# Patient Record
Sex: Male | Born: 1940 | Race: White | Hispanic: No | Marital: Married | Smoking: Never smoker
Health system: Southern US, Community
[De-identification: ages and names within clinical notes are randomized; demographics above are authoritative.]

## PROBLEM LIST (undated history)

## (undated) DIAGNOSIS — N400 Enlarged prostate without lower urinary tract symptoms: Secondary | ICD-10-CM

## (undated) DIAGNOSIS — J309 Allergic rhinitis, unspecified: Secondary | ICD-10-CM

## (undated) DIAGNOSIS — G629 Polyneuropathy, unspecified: Secondary | ICD-10-CM

## (undated) DIAGNOSIS — G4730 Sleep apnea, unspecified: Secondary | ICD-10-CM

## (undated) DIAGNOSIS — F329 Major depressive disorder, single episode, unspecified: Secondary | ICD-10-CM

## (undated) DIAGNOSIS — F419 Anxiety disorder, unspecified: Secondary | ICD-10-CM

## (undated) DIAGNOSIS — M5416 Radiculopathy, lumbar region: Secondary | ICD-10-CM

## (undated) DIAGNOSIS — J189 Pneumonia, unspecified organism: Secondary | ICD-10-CM

## (undated) DIAGNOSIS — N189 Chronic kidney disease, unspecified: Secondary | ICD-10-CM

## (undated) DIAGNOSIS — I639 Cerebral infarction, unspecified: Secondary | ICD-10-CM

## (undated) DIAGNOSIS — G894 Chronic pain syndrome: Secondary | ICD-10-CM

## (undated) DIAGNOSIS — I10 Essential (primary) hypertension: Secondary | ICD-10-CM

## (undated) DIAGNOSIS — E782 Mixed hyperlipidemia: Secondary | ICD-10-CM

## (undated) HISTORY — DX: Sleep apnea, unspecified: G47.30

## (undated) HISTORY — PX: BACK SURGERY: SHX140

## (undated) HISTORY — DX: Cerebral infarction, unspecified: I63.9

## (undated) HISTORY — PX: OTHER SURGICAL HISTORY: SHX169

## (undated) HISTORY — DX: Major depressive disorder, single episode, unspecified: F32.9

## (undated) HISTORY — PX: LUMBAR NERVE STIMLATOR INSERTION: SHX1983

## (undated) HISTORY — DX: Allergic rhinitis, unspecified: J30.9

## (undated) HISTORY — PX: THYROIDECTOMY: SHX17

## (undated) HISTORY — DX: Mixed hyperlipidemia: E78.2

## (undated) HISTORY — DX: Pneumonia, unspecified organism: J18.9

## (undated) HISTORY — PX: CERVICAL FUSION: SHX112

## (undated) HISTORY — DX: Chronic kidney disease, unspecified: N18.9

## (undated) HISTORY — DX: Polyneuropathy, unspecified: G62.9

---

## 2005-08-02 ENCOUNTER — Inpatient Hospital Stay (HOSPITAL_COMMUNITY): Admission: RE | Admit: 2005-08-02 | Discharge: 2005-08-06 | Payer: Self-pay | Admitting: Neurosurgery

## 2006-05-28 ENCOUNTER — Ambulatory Visit (HOSPITAL_COMMUNITY): Admission: RE | Admit: 2006-05-28 | Discharge: 2006-05-28 | Payer: Self-pay | Admitting: Neurosurgery

## 2006-06-19 ENCOUNTER — Inpatient Hospital Stay (HOSPITAL_COMMUNITY): Admission: RE | Admit: 2006-06-19 | Discharge: 2006-06-20 | Payer: Self-pay | Admitting: Neurosurgery

## 2006-07-28 ENCOUNTER — Encounter: Admission: RE | Admit: 2006-07-28 | Discharge: 2006-07-28 | Payer: Self-pay | Admitting: Neurosurgery

## 2006-10-18 ENCOUNTER — Ambulatory Visit (HOSPITAL_COMMUNITY): Admission: RE | Admit: 2006-10-18 | Discharge: 2006-10-18 | Payer: Self-pay | Admitting: Neurosurgery

## 2006-10-24 ENCOUNTER — Inpatient Hospital Stay (HOSPITAL_COMMUNITY): Admission: RE | Admit: 2006-10-24 | Discharge: 2006-10-26 | Payer: Self-pay | Admitting: Neurosurgery

## 2007-07-29 ENCOUNTER — Ambulatory Visit (HOSPITAL_COMMUNITY): Admission: RE | Admit: 2007-07-29 | Discharge: 2007-07-29 | Payer: Self-pay | Admitting: Neurosurgery

## 2007-12-10 ENCOUNTER — Ambulatory Visit (HOSPITAL_COMMUNITY): Admission: RE | Admit: 2007-12-10 | Discharge: 2007-12-10 | Payer: Self-pay | Admitting: Anesthesiology

## 2008-02-13 ENCOUNTER — Ambulatory Visit (HOSPITAL_COMMUNITY): Admission: RE | Admit: 2008-02-13 | Discharge: 2008-02-14 | Payer: Self-pay | Admitting: Neurological Surgery

## 2009-10-19 ENCOUNTER — Ambulatory Visit (HOSPITAL_COMMUNITY): Admission: RE | Admit: 2009-10-19 | Discharge: 2009-10-19 | Payer: Self-pay | Admitting: Anesthesiology

## 2010-02-02 ENCOUNTER — Encounter: Admission: RE | Admit: 2010-02-02 | Discharge: 2010-02-02 | Payer: Self-pay | Admitting: Anesthesiology

## 2010-09-27 NOTE — Op Note (Signed)
NAMELEVAUGHN, Ronald [name]                ACCOUNT NO.:  [id number]   MEDICAL RECORD NO.:  [id number]          PATIENT TYPE:  OIB   LOCATION:  3011                         FACILITY:  [facility]   PHYSICIAN:  Hilda Lias, M.D.   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   POSTOPERATIVE DIAGNOSIS:  Chronic L5-S1 radiculopathy status post L5-S1  fusion.   PROCEDURE:  Removal of right L5-S1 pedicle screws, removal of the left  L5 pedicle screws bilaterally, L5-S1 foraminotomy with lysis of  adhesions to decompress the L5-S1 nerve root.  Microscope.   SURGEON:  Hilda Lias, M.D.   ASSISTANT:  Danae Orleans. Venetia Maxon, M.D.   CLINICAL HISTORY:  Mr. Antillon is a gentleman who in the past underwent  fusion of L5-S1.  The patient later on developed pain.  We did a  foraminotomy but he is not any better.  We repeated the myelogram and  there is a possibility that the pedicle screws on the right side at S1  and on the left at L5 might be in close proximity to the nerve.  The  patient is no better.  He had been taking quite a bit of pain medication  and Neurontin without relief of the pain. Because of the findings,  surgery was advised.  We decided not to remove the left S1 nerve root  because not compromising the nerve and second because it is close to the  iliac vein.  We want to avoid any bleeding. Beside, clinically it is not  compromised as well by x-ray.  The risks were explained to him in the  history and physical.   DESCRIPTION OF PROCEDURE:  Mr. Nauert was taken to the OR and he was  positioned in a prone manner.  The skin was cleaned with DuraPrep.  A  midline incision was made all the way until we found the upper part of  the S1.  We went laterally and we identified easily the L5-S1 pedicle  screws.  Immediately we removed the caps  of the right side, we removed  the rod, and we removed both the right L5 and S1 pedicle screws.  On the  left side, we removed the caps and the rod.  We removed only the screw  of the L5 leaving the S1 in place.  Then, we started doing lysis of  adhesion.  The patient had quite a bit of adhesion and lysis was  accomplished.  We finally were able to get into the foramen and we did a  foraminotomy to decompress the L5-S1 nerve root.  This procedure was  done bilaterally.  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  We used a Tisseel to prevent any CSF leak.  Valsalva maneuver  to 40 was negative.  From then on, the area was irrigated.  The wound  was closed with Vicryl  and nylon.           ______________________________  Hilda Lias, M.D.     EB/MEDQ  D:  10/24/2006  T:  10/24/2006  Job:  [id number]

## 2010-09-27 NOTE — H&P (Signed)
NAMESIMCHA, [name]                ACCOUNT NO.:  [id number]   MEDICAL RECORD NO.:  [id number]          PATIENT TYPE:  OIB   LOCATION:  3011                         FACILITY:  [facility]   PHYSICIAN:  Hilda Lias, M.D.   DATE OF BIRTH:  02-19-1941   DATE OF ADMISSION:  10/24/2006  DATE OF DISCHARGE:                              HISTORY & PHYSICAL   Ronald [name] is a gentleman who in the past underwent fusion at the level  of L5-S1 because of chronic back pain with radiation to both legs.  Later on he developed more pain and in February we did a bilateral  foraminotomy at the level of L5-S1.  Nevertheless, he continued to have  pain with the pain going to both lower extremities.  There is no  weakness.  The patient has been taking oxycodone and Neurontin without  relief of the pain.  We repeated the MRI and there is a possibility that  the pedicle screws at the level of right S1 and left L5 may be close to  the nerve.  Nevertheless, since he had failed with every single  conservative treatment, at the end we agreed to proceed with removal of  the screws, leaving the left S1 nerve root  in place because of close  proximity to the vein.  Nevertheless, there is no compromise of the  medial wall in that one.   PAST MEDICAL HISTORY:  Lumbar fusion, L5-S1, with bilateral foraminotomy  at L5-S1 in February.  He has a thyroidectomy, a cervical fusion.   He is allergic to PHENOBARBITAL and MILK PRODUCTS.   SOCIAL HISTORY:  Negative.   FAMILY HISTORY:  Mother has COPD.   REVIEW OF SYSTEMS:  Positive for back and leg pain.   PHYSICAL EXAMINATION:  HEENT:  Head, nose and throat normal.  NECK:  There is a scar anteriorly.  LUNGS:  Clear.  CARDIAC:  Heart sounds normal.  ABDOMEN:  Normal.  EXTREMITIES:  Normal pulses.  NEUROLOGIC:  Mental status normal.  Cranial nerves normal.  Strength  normal.  Sensation normal.  Coordination normal.  Straight leg raising,  SLR is positive at 60 degrees  bilaterally.   CLINICAL IMPRESSION:  Chronic radiculopathy, status post fusion at L5-  S1.   RECOMMENDATIONS:  The patient going to have removal of the screws,  leaving the one at the level of S1 in place because of proximity to the  vein.  Nevertheless, there is no need to remove that since there is no  compromise  of the medial wall of the pedicle.  The patient knows about the risks  such as no improvement whatsoever, CSF leak, need for further surgery,  infection.  The patient declined more opinion.  He did not want any pain  clinic for pain control but to proceed with surgery to see if that will  help him.           ______________________________  Hilda Lias, M.D.     EB/MEDQ  D:  10/24/2006  T:  10/25/2006  Job:  [id number]

## 2010-09-27 NOTE — Op Note (Signed)
NAMEJIMY, GATES                ACCOUNT NO.:  [id number]   MEDICAL RECORD NO.:  [id number]          PATIENT TYPE:  OIB   LOCATION:  3526                         FACILITY:  [facility]   PHYSICIAN:  Stefani Dama, M.D.  DATE OF BIRTH:  11-20-1940   DATE OF PROCEDURE:  02/13/2008  DATE OF DISCHARGE:                               OPERATIVE REPORT   PREOPERATIVE DIAGNOSIS:  Intractable back pain status post lumbar  decompression and fusion.   POSTOPERATIVE DIAGNOSIS:  Intractable back pain status post lumbar  decompression and fusion.   PROCEDURE:  Placement of spinal cord stimulator.   SURGEON:  Stefani Dama, MD   ANESTHESIA:  General endotracheal.   INDICATIONS:  Mr. Peng Thorstenson is a 70 year old individual who has been  having substantial problem with back pain, hip pain, and pain in both  buttocks and legs.  He had a spinal cord stimulator trial done as an  outpatient with lead at T9 and T10 and this gave him good relief.  He is  now being taken to the operating room to undergo placement of permanent  spinal cord stimulator with a paddle lead.   PROCEDURE:  The patient was brought to the operating room supine on the  stretcher.  After smooth induction of general endotracheal anesthesia,  he was turned prone.  The back was prepped with alcohol and DuraPrep and  draped in sterile fashion.  A midline incision was made at the  thoracolumbar junction overlying T11 and T-12.  Subperiosteal dissection  performed on the left side, and a laminotomy was performed at the T11-12  level.  Once this was adequately sized, a paddle lead was then placed  and threaded up to level of T9 and T10.  Good coverage of the T9 and T10  area was observed with the paddle lead being placed midway on the disk  space of T9-10.  Then, a subcutaneous tunnel was made through a separate  incision over the left posterior-superior iliac crest.  A pocket had  been fashioned in this site for the generator.  When  the lead was passed  through this area, the leads were connected to the generator after being  carefully inspected and dried and an impedance checked was performed  with the generator implanted in the pocket.  The impedance test was good  on all leads and then the leads were tightened into the final resting  position and the generator was placed into the subcutaneous pocket with  the extra amount of the lead curled underneath the generator.  A final  check was made of placement of the electrodes.  They needed to be  adjusted as the electrode had slipped down somewhat.  When the  electrodes were centered over the T9-10 level, a keeper was fastened to  one of the electrode leads and this was placed in the paraspinous  musculature to maintain it in position.  Once this was secured, the  retractors were removed.  The lumbodorsal fascia was closed in the  thoracic spine with 0 Vicryl, 2-0 Vicryl was used in the subcutaneous  tissues,  and 3-0 Vicryl used to close the subcuticular  skin.  The pouch for the spinal cord stimulator generator was then  closed with 2-0 Vicryl interrupted fashion in the subcutaneous tissues  and 3-0 Vicryl subcuticularly.  Dry sterile dressings were placed on  each of the wound.  The patient tolerated the procedure well and was  returned to recovery room in stable condition.  Blood loss was minimal.      Stefani Dama, M.D.  Electronically Signed     HJE/MEDQ  D:  02/13/2008  T:  02/14/2008  Job:  [id number]

## 2010-09-30 NOTE — Discharge Summary (Signed)
[name], Ronald [name]                ACCOUNT NO.:  [id number]   MEDICAL RECORD NO.:  [id number]          PATIENT TYPE:  INP   LOCATION:  3014                         FACILITY:  [facility]   PHYSICIAN:  Hilda Lias, M.D.   DATE OF BIRTH:  11-18-1940   DATE OF ADMISSION:  08/02/2005  DATE OF DISCHARGE:  08/06/2005                                 DISCHARGE SUMMARY   ADMISSION DIAGNOSIS:  L5-S1 spondylolisthesis, grade 1 with chronic  radiculopathy   FINAL DIAGNOSIS(ES):  L5-S1 spondylolisthesis, grade 1 with chronic  radiculopathy .   CLINICAL HISTORY:  The patient was admitted because of back pain radiating  to both legs.  X-rays showed spondylolisthesis, grade 1, between L5-S1.  Surgery was advised.  Marland Kitchen   HOSPITAL COURSE:  The patient was taken to surgery and L5-S1 diskectomy,  interbody fusion, posterolateral arthrodesis was done.  The patient at the  beginning had quite a bit of pain but later on started ambulating and he was  discharged on March 25 by Dr. Lovell Sheehan on pain medication and muscle  relaxants.   CONDITION ON DISCHARGE:  Improved.   DIET:  Regular.   ACTIVITY:  Do not drive for at least two weeks.   FOLLOW UP:  In four weeks in my office.           ______________________________  Hilda Lias, M.D.     EB/MEDQ  D:  09/05/2005  T:  09/06/2005  Job:  [id number]

## 2010-09-30 NOTE — H&P (Signed)
Ronald [name], Ronald [name]                ACCOUNT NO.:  [id number]   MEDICAL RECORD NO.:  [id number]          PATIENT TYPE:  INP   LOCATION:  3032                         FACILITY:  [facility]   PHYSICIAN:  Hilda Lias, M.D.   DATE OF BIRTH:  Dec 25, 1940   DATE OF ADMISSION:  06/19/2006  DATE OF DISCHARGE:                              HISTORY & PHYSICAL   HISTORY OF PRESENT ILLNESS:  Ronald [name] is a gentleman, who on August 02, 2005, underwent L5-S1 fusion secondary to a spondylolisthesis with a  chronic radiculopathy.  The patient did well.  He was seen in my office  on May 07, 2006, and prior to that on June 2007.  He tell me that  the pain is not any better.  That he is having pain going down to both  lower extremity.  It gets worse with walking.  His epidural injection  did not help him.  Because of that we decided to bring him to the  hospital to take a look at the L5-S1 area.  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   PAST MEDICAL HISTORY:  1. Lumbar fusion L5-S1 a year ago.  2. He has had a thyroidectomy.  3. Cervical fusion.   ALLERGIES:  He is allergic to PHENOBARBITAL.   SOCIAL HISTORY:  Negative.   FAMILY HISTORY:  Negative.   REVIEW OF SYSTEMS:  Is positive for back pain, leg pain and signs of  depression.   PHYSICAL EXAMINATION:  HEAD, EARS, EYES, THROAT:  Normal.  NECK:  There is a scar from previous surgery.  CARDIOVASCULAR:  Unremarkable.  LUNGS:  Unremarkable.  ABDOMEN:  Unremarkable.  EXTREMITY:  Unremarkable.  NEURO:  Unremarkable.  His strength is normal.  He has had some mild  weakness in dorsiflexion of both legs.  Straight leg raising is positive  on 60 degrees.  Reflexes are symmetrical.   Lumbar spinal x-ray showed some mild spondylosis at the L4-5 and L5-S1.  The area with flexion and extension showed this diffuse is completely  solid.   CLINICAL IMPRESSION:  Failure back syndrome.  Continuation of the L5-S1  radiculopathy.   RECOMMENDATIONS:  We are going to admit Ronald [name] to surgery.  The  procedure will be a foraminotomy to compressed L5-S1 root.  He knows all  the risks such as no improvement whatsoever, need of further surgery,  infection, cerebrospinal fluid leak, damage to the vessel of the  abdomen.           ______________________________  Hilda Lias, M.D.     EB/MEDQ  D:  06/19/2006  T:  06/20/2006  Job:  [id number]

## 2010-09-30 NOTE — Op Note (Signed)
[name], Ronald [name]                ACCOUNT NO.:  [id number]   MEDICAL RECORD NO.:  [id number]          PATIENT TYPE:  INP   LOCATION:  3032                         FACILITY:  [facility]   PHYSICIAN:  Hilda Lias, M.D.   DATE OF BIRTH:  [birth year]   DATE OF PROCEDURE:  06/19/2006  DATE OF DISCHARGE:                               OPERATIVE REPORT   PREOPERATIVE DIAGNOSES:  Bilateral L5-S1 radiculopathy, status post L5-  S1 fusion in March 2007.   POSTOPERATIVE DIAGNOSES:  Bilateral L5-S1 radiculopathy, status post L5-  S1 fusion in March 2007.   PROCEDURES:  Bilateral L4, L5 and S1 nerve root decompression.   SURGEON:  Hilda Lias, M.D.   ASSISTANT:  Clydene Fake, M.D.   CLINICAL HISTORY:  Ronald [name] is a gentleman, who underwent L5-S1  diskectomy with fusion back in March 2007.  He did well, but lately had  been complaining of burning pain going to both lower extremities.  Myelogram followed a CT scan showed a good fusion, and there was a  narrowing of the foramen at the level compromising mostly L5 and S1.  Surgery was advised and the risks were explained at the History and  Physical.   DESCRIPTION OF PROCEDURES:  The patient was taken to the OR, and after  intubation, the back was prepped with DuraPrep.  A midline incision  following the previous one was made, and we retracted the muscle all the  way laterally.  We found the pedicle screws bilaterally.  Then, we found  quite a bit of adhesions.  Lysis was accomplished, and we were able to  get into the dural sac.  At the foramina for the L5-S1 level, and using  the drill, as well, the 1, 2 and 3-mm Kerrison punches, we did have good  decompression.  At L4 we did a foraminotomy; that was also completely  clean.  Nevertheless, the S1 also was a bit narrow, and decompression  was achieved.  Out of the 3 nerve roots, the L5 was the worst one.  At  the end, we had a good decompression.  Valsalva maneuver was negative.  Fat was  left on top of the dura mater to prevent adhesions.  Valsalva  maneuver was negative.  From then on, the wound was closed with Vicryl  and Steri-Strips.           ______________________________  Hilda Lias, M.D.     EB/MEDQ  D:  06/19/2006  T:  06/19/2006  Job:  [id number]

## 2010-09-30 NOTE — Op Note (Signed)
NAMELAIKEN, NOHR                ACCOUNT NO.:  [id number]   MEDICAL RECORD NO.:  [id number]          PATIENT TYPE:  INP   LOCATION:  2899                         FACILITY:  [facility]   PHYSICIAN:  Hilda Lias, M.D.   DATE OF BIRTH:  07/28/1940   DATE OF PROCEDURE:  DATE OF DISCHARGE:                                 OPERATIVE REPORT   Audio too short to transcribe (less than 5 seconds)           ______________________________  Hilda Lias, M.D.     EB/MEDQ  D:  08/02/2005  T:  08/02/2005  Job:  [id number]

## 2010-09-30 NOTE — Op Note (Signed)
NAMEREYNOLD, MANTELL                ACCOUNT NO.:  [id number]   MEDICAL RECORD NO.:  [id number]          PATIENT TYPE:  INP   LOCATION:  2899                         FACILITY:  [facility]   PHYSICIAN:  Hilda Lias, M.D.   DATE OF BIRTH:  1940-08-14   DATE OF PROCEDURE:  08/02/2005  DATE OF DISCHARGE:                                 OPERATIVE REPORT   PREOPERATIVE DIAGNOSIS:  L5-S1 spondylolisthesis with chronic radiculopathy,  degenerative disk disease.   POSTOPERATIVE DIAGNOSIS:  L5-S1 spondylolisthesis with chronic  radiculopathy, degenerative disk disease.   OPERATION PERFORMED:  L5-S1 diskectomy, laminectomy and facetectomy,  interbody fusion with cage with autograft and bone morphogenic protein,  posterolateral arthrodesis with autograft and bone morphogenic protein,  pedicle screws, L5 to S1.  C-arm.  Cell Saver.   SURGEON:  Hilda Lias, M.D.   ASSISTANT:  Danae Orleans. Venetia Maxon, M.D.   ANESTHESIA:  General.   INDICATIONS FOR PROCEDURE:  The patient was admitted because of back pain  with radiation to both legs.  X-ray showed spondylolisthesis with  degenerative disk disease and ruptured disk.  Surgery was advised and the  risks were explained in the history and physical.   DESCRIPTION OF PROCEDURE:  The patient was taken to the operating room and  he was positioned in prone manner.  The back was prepped.  Midline incision  was made from L4 down to L5-S1.  Muscle was retracted all the way laterally  until we found the transverse process of L5.  From then on, with the  Leksell, we removed the spinous process and the lamina as well as the facet  of L5.  The patient had quite a bit of adhesion and lysis was accomplished.  Then we retracted the thecal sac.  We entered the disk space first on the  left side and then later on the right side.  A total diskectomy was  achieved.  The end plate were also removed.  From then on, two cages with  autograft and BMP were inserted.  They were  like 12 x 22.  From then on, we  went laterally and we removed the periosteum of the ala of the sacrum and  the transverse processes.  Posterolateral arthrodesis with BMP and autograft  was done.  Then with the help of C-arm first in the AP view and later on in  a lateral view, we introduced four pedicle screws.  At the level of L5 they  were like 5.5 x 45 and the lower part was 5.5 x 50.  AP and lateral showed  good position of the graft.  From then on the area was irrigated.  Valsalva  maneuver was negative. Fentanyl was left in the dural space and the wound  was closed with Vicryl and Steri-Strips.           ______________________________  Hilda Lias, M.D.     EB/MEDQ  D:  08/02/2005  T:  08/03/2005  Job:  [id number]

## 2011-02-13 LAB — CBC
HCT: 34.6 — ABNORMAL LOW
Platelets: 211
RBC: 3.74 — ABNORMAL LOW
RDW: 13.9

## 2011-02-13 LAB — BASIC METABOLIC PANEL
BUN: 13
CO2: 29
Calcium: 9.4
GFR calc non Af Amer: >60

## 2011-03-02 LAB — BASIC METABOLIC PANEL
BUN: 16
CO2: 24
Chloride: 101
GFR calc non Af Amer: >60
Potassium: 3.7
Sodium: 135

## 2011-03-02 LAB — CBC
HCT: 38.1 — ABNORMAL LOW
Hemoglobin: 12.9 — ABNORMAL LOW
MCHC: 34
RBC: 4.22
WBC: 5.2

## 2011-09-29 ENCOUNTER — Other Ambulatory Visit: Payer: Self-pay | Admitting: Physician Assistant

## 2011-09-29 DIAGNOSIS — M5410 Radiculopathy, site unspecified: Secondary | ICD-10-CM

## 2011-10-02 ENCOUNTER — Ambulatory Visit
Admission: RE | Admit: 2011-10-02 | Discharge: 2011-10-02 | Disposition: A | Discharge status: Home / Self Care | Payer: Medicare Other | Source: Ambulatory Visit | Attending: Physician Assistant | Admitting: Physician Assistant

## 2011-10-02 VITALS — BP 172/81 | HR 69 | Ht 66.0 in | Wt 180.0 lb

## 2011-10-02 DIAGNOSIS — M5410 Radiculopathy, site unspecified: Secondary | ICD-10-CM

## 2011-10-02 MED ORDER — IOHEXOL 180 MG/ML  SOLN
20.0000 mL | Freq: Once | INTRAMUSCULAR | Status: AC | PRN
  Administered 2011-10-02: 20 mL via INTRATHECAL

## 2011-10-02 MED ORDER — DIAZEPAM 5 MG PO TABS
5.0000 mg | ORAL_TABLET | Freq: Once | ORAL | Status: AC
  Administered 2011-10-02: 5 mg via ORAL

## 2011-10-02 NOTE — Discharge Instructions (Signed)
Myelogram Discharge Instructions  1. Go home and rest quietly for the next 24 hours.  It is important to lie flat for the next 24 hours.  Get up only to go to the restroom.  You may lie in the bed or on a couch on your back, your stomach, your left side or your right side.  You may have one pillow under your head.  You may have pillows between your knees while you are on your side or under your knees while you are on your back.  2. DO NOT drive today.  Recline the seat as far back as it will go, while still wearing your seat belt, on the way home.  3. You may get up to go to the bathroom as needed.  You may sit up for 10 minutes to eat.  You may resume your normal diet and medications unless otherwise indicated.  Drink lots of extra fluids today and tomorrow.  4. The incidence of headache, nausea, or vomiting is about 5% (one in 20 patients).  If you develop a headache, lie flat and drink plenty of fluids until the headache goes away.  Caffeinated beverages may be helpful.  If you develop severe nausea and vomiting or a headache that does not go away with flat bed rest, call [phone].  5. You may resume normal activities after your 24 hours of bed rest is over; however, do not exert yourself strongly or do any heavy lifting tomorrow. If when you get up you have a headache when standing, go back to bed and force fluids for another 24 hours.  6. Call your physician for a follow-up appointment.  The results of your myelogram will be sent directly to your physician by the following day.  7. If you have any questions or if complications develop after you arrive home, please call [phone].  Discharge instructions have been explained to the patient.  The patient, or the person responsible for the patient, fully understands these instructions.       May resume cymbalta on Oct 03, 2011,after 1:00 pm.

## 2012-11-05 ENCOUNTER — Emergency Department (HOSPITAL_COMMUNITY): Payer: Medicare Other

## 2012-11-05 ENCOUNTER — Observation Stay (HOSPITAL_COMMUNITY)
Admission: EM | Admit: 2012-11-05 | Discharge: 2012-11-07 | Disposition: A | Discharge status: Home / Self Care | Payer: Medicare Other | Attending: Family Medicine | Admitting: Family Medicine

## 2012-11-05 ENCOUNTER — Encounter (HOSPITAL_COMMUNITY): Payer: Self-pay | Admitting: *Deleted

## 2012-11-05 DIAGNOSIS — N400 Enlarged prostate without lower urinary tract symptoms: Secondary | ICD-10-CM | POA: Insufficient documentation

## 2012-11-05 DIAGNOSIS — G894 Chronic pain syndrome: Secondary | ICD-10-CM | POA: Insufficient documentation

## 2012-11-05 DIAGNOSIS — R079 Chest pain, unspecified: Principal | ICD-10-CM | POA: Insufficient documentation

## 2012-11-05 DIAGNOSIS — E785 Hyperlipidemia, unspecified: Secondary | ICD-10-CM

## 2012-11-05 DIAGNOSIS — IMO0002 Reserved for concepts with insufficient information to code with codable children: Secondary | ICD-10-CM | POA: Insufficient documentation

## 2012-11-05 DIAGNOSIS — I10 Essential (primary) hypertension: Secondary | ICD-10-CM | POA: Insufficient documentation

## 2012-11-05 DIAGNOSIS — R61 Generalized hyperhidrosis: Secondary | ICD-10-CM | POA: Insufficient documentation

## 2012-11-05 DIAGNOSIS — R0602 Shortness of breath: Secondary | ICD-10-CM | POA: Insufficient documentation

## 2012-11-05 DIAGNOSIS — F411 Generalized anxiety disorder: Secondary | ICD-10-CM | POA: Insufficient documentation

## 2012-11-05 HISTORY — DX: Essential (primary) hypertension: I10

## 2012-11-05 HISTORY — DX: Chronic pain syndrome: G89.4

## 2012-11-05 HISTORY — DX: Radiculopathy, lumbar region: M54.16

## 2012-11-05 HISTORY — DX: Anxiety disorder, unspecified: F41.9

## 2012-11-05 HISTORY — DX: Benign prostatic hyperplasia without lower urinary tract symptoms: N40.0

## 2012-11-05 LAB — BASIC METABOLIC PANEL
BUN: 22 mg/dL (ref 6–23)
Calcium: 9.6 mg/dL (ref 8.4–10.5)
GFR calc non Af Amer: 61 mL/min — ABNORMAL LOW (ref >90)
Glucose, Bld: 98 mg/dL (ref 70–99)
Potassium: 4.5 mEq/L (ref 3.5–5.1)

## 2012-11-05 LAB — CBC
HCT: 37.3 % — ABNORMAL LOW (ref 39.0–52.0)
Hemoglobin: 13 g/dL (ref 13.0–17.0)
MCH: 32 pg (ref 26.0–34.0)
MCHC: 34.9 g/dL (ref 30.0–36.0)

## 2012-11-05 LAB — POCT I-STAT TROPONIN I: Troponin i, poc: 0 ng/mL (ref 0.00–0.08)

## 2012-11-05 LAB — TROPONIN I: Troponin I: <0.3 ng/mL (ref <0.30)

## 2012-11-05 MED ORDER — TAMSULOSIN HCL 0.4 MG PO CAPS
0.4000 mg | ORAL_CAPSULE | Freq: Every day | ORAL | Status: DC
Start: 2012-11-06 — End: 2012-11-07
  Administered 2012-11-06 – 2012-11-07 (×2): 0.4 mg via ORAL
  Filled 2012-11-05 (×2): qty 1

## 2012-11-05 MED ORDER — SODIUM CHLORIDE 0.9 % IJ SOLN: 3.0000 mL | INTRAMUSCULAR | Status: DC | PRN

## 2012-11-05 MED ORDER — SODIUM CHLORIDE 0.9 % IJ SOLN
3.0000 mL | Freq: Two times a day (BID) | INTRAMUSCULAR | Status: DC
  Administered 2012-11-05 – 2012-11-07 (×4): 3 mL via INTRAVENOUS

## 2012-11-05 MED ORDER — DULOXETINE HCL 60 MG PO CPEP
60.0000 mg | ORAL_CAPSULE | Freq: Every day | ORAL | Status: DC
  Administered 2012-11-06 – 2012-11-07 (×2): 60 mg via ORAL
  Filled 2012-11-05 (×2): qty 1

## 2012-11-05 MED ORDER — OXYCODONE HCL 5 MG PO TABS
15.0000 mg | ORAL_TABLET | ORAL | Status: DC | PRN
  Administered 2012-11-05 – 2012-11-07 (×10): 15 mg via ORAL
  Filled 2012-11-05: qty 2
  Filled 2012-11-05 (×9): qty 3
  Filled 2012-11-05: qty 1

## 2012-11-05 MED ORDER — SODIUM CHLORIDE 0.9 % IJ SOLN: 3.0000 mL | Freq: Two times a day (BID) | INTRAMUSCULAR | Status: DC

## 2012-11-05 MED ORDER — ASPIRIN EC 81 MG PO TBEC
81.0000 mg | DELAYED_RELEASE_TABLET | Freq: Every day | ORAL | Status: DC
  Administered 2012-11-06 – 2012-11-07 (×2): 81 mg via ORAL
  Filled 2012-11-05 (×2): qty 1

## 2012-11-05 MED ORDER — HEPARIN SODIUM (PORCINE) 5000 UNIT/ML IJ SOLN
5000.0000 [IU] | Freq: Three times a day (TID) | INTRAMUSCULAR | Status: DC
  Administered 2012-11-05 – 2012-11-07 (×4): 5000 [IU] via SUBCUTANEOUS
  Filled 2012-11-05 (×8): qty 1

## 2012-11-05 MED ORDER — SODIUM CHLORIDE 0.9 % IV SOLN: 250.0000 mL | INTRAVENOUS | Status: DC | PRN

## 2012-11-05 MED ORDER — AMLODIPINE BESYLATE 5 MG PO TABS
5.0000 mg | ORAL_TABLET | Freq: Every day | ORAL | Status: DC
  Administered 2012-11-06: 5 mg via ORAL
  Filled 2012-11-05: qty 1

## 2012-11-05 MED ORDER — ONDANSETRON HCL 4 MG/2ML IJ SOLN: 4.0000 mg | Freq: Three times a day (TID) | INTRAMUSCULAR | Status: AC | PRN

## 2012-11-05 MED ORDER — DIAZEPAM 5 MG PO TABS
2.5000 mg | ORAL_TABLET | Freq: Every evening | ORAL | Status: DC | PRN
Start: 2012-11-05 — End: 2012-11-07
  Administered 2012-11-05 – 2012-11-06 (×2): 2.5 mg via ORAL
  Filled 2012-11-05 (×2): qty 1

## 2012-11-05 NOTE — ED Provider Notes (Signed)
Medical screening examination/treatment/procedure(s) were conducted as a shared visit with non-physician practitioner(s) and myself.  I personally evaluated the patient during the encounter  1 year of intermittent R jaw and chest pain, severe today and moved to L jaw. EKG unchanged. Troponin negative.  No recent stress test. Patient prefers inpatient evaluation.  Glynn Octave, MD 11/05/12 2134

## 2012-11-05 NOTE — ED Provider Notes (Signed)
History    CSN: [id number] Arrival date & time 11/05/12  1246  First MD Initiated Contact with Patient 11/05/12 1352     Chief Complaint  Patient presents with  . Chest Pain   (Consider location/radiation/quality/duration/timing/severity/associated sxs/prior Treatment) HPI Comments: Patient with history of negative stress test -- presents with complaint of chest and right-sided neck pain that began acutely at 11 AM and resolved approximately 45 minutes later after taking #3 324 mg aspirin and 2 of his stepsons nitroglycerin. Patient has had similar symptoms once every one to 2 weeks over the past year but never this severe. Pain returned briefly this afternoon the patient came to the emergency department for evaluation. Pain is now resolved. It was not associated with fever, nausea, vomiting, lightheadedness, shortness of breath, palpitations, diaphoresis. Patient has not told his primary care physician about the symptoms. No lower extremity swelling. Patient has a has history of lower back problems for which he has been prescribed oxycodone. Family states that patient has been under more stress recently. Onset of symptoms acute. Course is resolved. Nothing makes symptoms worse.  The history is provided by the patient.   Past Medical History  Diagnosis Date  . Hypertension    Past Surgical History  Procedure Laterality Date  . Back surgery     History reviewed. No pertinent family history. History  Substance Use Topics  . Smoking status: Never Smoker   . Smokeless tobacco: Not on file  . Alcohol Use: No    Review of Systems  Constitutional: Negative for fever and diaphoresis.  HENT: Positive for neck pain.   Eyes: Negative for redness.  Respiratory: Negative for cough and shortness of breath.   Cardiovascular: Positive for chest pain. Negative for palpitations and leg swelling.  Gastrointestinal: Negative for nausea, vomiting and abdominal pain.  Genitourinary: Negative for  dysuria.  Musculoskeletal: Negative for back pain.  Skin: Negative for rash.  Neurological: Negative for syncope and light-headedness.    Allergies  Phenobarbital  Home Medications   Current Outpatient Rx  Name  Route  Sig  Dispense  Refill  . amLODipine (NORVASC) 5 MG tablet   Oral   Take 5 mg by mouth daily.         . celecoxib (CELEBREX) 200 MG capsule   Oral   Take 200 mg by mouth daily.         . diazepam (VALIUM) 5 MG tablet   Oral   Take 2.5 mg by mouth at bedtime as needed for anxiety.         . DULoxetine (CYMBALTA) 60 MG capsule   Oral   Take 60 mg by mouth daily.         Marland Kitchen oxyCODONE (ROXICODONE) 15 MG immediate release tablet   Oral   Take 15 mg by mouth every 4 (four) hours as needed for pain. Up to 7 tablets in a day.         . tamsulosin (FLOMAX) 0.4 MG CAPS   Oral   Take 0.4 mg by mouth daily.          BP 159/101  Pulse 83  Temp(Src) 98.1 F (36.7 C) (Oral)  Resp 18  SpO2 99% Physical Exam  Nursing note and vitals reviewed. Constitutional: He appears well-developed and well-nourished.  HENT:  Head: Normocephalic and atraumatic.  Mouth/Throat: Mucous membranes are normal. Mucous membranes are not dry.  Eyes: Conjunctivae are normal.  Neck: Trachea normal and normal range of motion. Neck supple. Normal  carotid pulses and no JVD present. No muscular tenderness present. Carotid bruit is not present. No tracheal deviation present.  Cardiovascular: Normal rate, regular rhythm, S1 normal, S2 normal, normal heart sounds and intact distal pulses.  Exam reveals no distant heart sounds and no decreased pulses.   No murmur heard. 2+ radial pulses, equal.  Pulmonary/Chest: Effort normal and breath sounds normal. No respiratory distress. He has no wheezes. He exhibits no tenderness.  Abdominal: Soft. Normal aorta and bowel sounds are normal. There is no tenderness. There is no rebound and no guarding.  No pulsatile abdominal masses palpated   Musculoskeletal: He exhibits no edema.  Neurological: He is alert.  Skin: Skin is warm and dry. He is not diaphoretic. No cyanosis. No pallor.  Psychiatric: He has a normal mood and affect.    ED Course  Procedures (including critical care time) Labs Reviewed  CBC - Abnormal; Notable for the following:    RBC 4.06 (*)    HCT 37.3 (*)    All other components within normal limits  BASIC METABOLIC PANEL - Abnormal; Notable for the following:    GFR calc non Af Amer 61 (*)    GFR calc Af Amer 71 (*)    All other components within normal limits  POCT I-STAT TROPONIN I   No results found. 1. Chest pain     1:55 PM Patient seen and examined. Work-up initiated. Medications ordered.   Vital signs reviewed and are as follows: Filed Vitals:   11/05/12 1251  BP: 159/101  Pulse: 83  Temp: 98.1 F (36.7 C)  Resp: 18    Date: 11/05/2012  Rate: 83  Rhythm: normal sinus rhythm  QRS Axis: normal  Intervals: normal  ST/T Wave abnormalities: normal  Conduction Disutrbances:none  Narrative Interpretation:   Old EKG Reviewed: changes noted from 10/24/2006, rate faster, otherwise unchanged  3:51 PM Dr. Manus Gunning has seen. Will admit. I have spoken with [facility] who will see.    MDM  CP, admit. Troponin, EKG neg at this point.     Renne Crigler, PA-C 11/05/12 1552

## 2012-11-05 NOTE — H&P (Signed)
Family Medicine Teaching [facility] Admission History and Physical Service Pager: [phone]  Patient name: Ronald [name] Medical record number: [id number] Date of birth: 1941/01/01 Age: 72 y.o. Gender: male  Primary Care Provider: No primary provider on file.  Chief Complaint: Chest pain  Assessment and Plan: Ronald [name] is a 72 y.o. year old male here with chest pain, radiating to his jaw. His PMH is significant for HTN.  # Chest pain: Patient reports two episodes of chest pain with a pressure-like quality and radiation to his jaw bilaterally. He endorses some mild diaphoresis, but denies N/V and SOB. This discomfort was unrelieved with 325mg  ASA x3, but faded after half an hour. Upon experiencing a second episode, patient decided to come to the ED. Description of pain with negative POC troponin and normal EKG point more towards an MSK cause than cardiac. Patient has no pain upon interview. - Continue to monitor chest pain  - Admit to telemetry - CBC, BMET, TSH, Hgb A1c, Lipid panel - Start 81mg  ASA daily [ ]  F/u with Cardiology as an outpatient, likely needs stress test [ ]  Cycle cardiac enzymes [ ]  Repeat EKG tomorrow morning  # History of HTN: Patient has elevated pressures in the ED of 159/101. Will restart home medications (amlodipine). - Continue to monitor  # History of Chronic pain: Patient has chronic back pain 2/2 spinal fusion surgery. This also causes chronic pain in his legs. He is s/p nerve stimulator implantation. - Continue home pain meds (cymbalta, oxycodone, celebrex)  # History of BPH: Patient denies any change from urinary baseline - Continue home Flomax  # History of anxiety - Continue home valium  FEN/GI: Heart healthy, as tolerated Prophylaxis: SQ Heparin Disposition: Pending negative cardiac workup Code Status: FULL, as discussed upon admission  History of Present Illness: Ronald [name] is a 72 y.o. year old male presenting with chest pain. He  has a history of intermittent episodes of R jaw pain that spreads to his R chest wall, usually relieved by 325mg  ASA. These episodes occur once every 1-2 weeks. Today, the pain was "different," affecting his jaw bilaterally and with more of a pressure-like sensation in his chest. He endorsed mild diaphoresis, but denied any N/V and SOB. He states that he did not notice any change in the pain with either activity or rest. He states he may also have had some central back pain at the level of his scapulae, but also has chronic back pain at baseline. The patient was examined by EMS at his local fire station and reportedly had a normal EKG. His pain had subsided and so he declined to go to the ED. Upon returning home, he also took some nitro given to him by his son-in-law, however, his chest and jaw pain reoccurred prompting him to present to the ED. He denies any other pain, weakness, and residual deficits.  In the ED, POC troponin was negative and he had a normal EKG. His pain had subsided and he did not receive any medications.  There are no active problems to display for this patient.  Past Medical History: Past Medical History  Diagnosis Date  . Hypertension   . BPH (benign prostatic hyperplasia)   . Chronic pain syndrome   . Lumbar radicular pain   . Anxiety    Past Surgical History: Past Surgical History  Procedure Laterality Date  . Back surgery    . Cervical fusion    . Thyroidectomy    . Lumbar  nerve stimlator insertion    . Colonoscopy      reported as normal   Social History: History  Substance Use Topics  . Smoking status: Never Smoker   . Smokeless tobacco: Never Used  . Alcohol Use: No  Patient is a Education officer, environmental.  For any additional social history documentation, please refer to relevant sections of EMR.  Family History: Per patient, his father had a multitude of health problems including cardiac issues necessitating open-heart surgery and gout. He states that there is no other  significant family history.  Allergies: Allergies  Allergen Reactions  . Phenobarbital Rash   No current facility-administered medications on file prior to encounter.   No current outpatient prescriptions on file prior to encounter.   Review Of Systems: Per HPI with the following additions: Patient reports increased fatigue over the past week and states this may be 2/2 his chronic back pain. Otherwise 12 point review of systems was performed and was unremarkable.  Physical Exam: BP 156/73  Pulse 82  Temp(Src) 98.6 F (37 C) (Oral)  Resp 16  Ht 5\' 7"  (1.702 m)  Wt 185 lb 11.2 oz (84.233 kg)  BMI 29.08 kg/m2  SpO2 91% Exam: General: Well-appearing, sitting on stretcher comfortably, no acute distress Cardiovascular: RRR, no murmur appreciated Respiratory: Good air movement, no increased work of breathing, CTAB Abdomen: Active bowel sounds, non-TTP Extremities: +1 pitting edema in lower extremities bilaterally Skin: No rashes or lesions seen Neuro: Grossly oriented, interactive and appropriate  Labs and Imaging: CBC BMET   Recent Labs Lab 11/05/12 1259  WBC 4.7  HGB 13.0  HCT 37.3*  PLT 195    Recent Labs Lab 11/05/12 1259  NA 137  K 4.5  CL 97  CO2 31  BUN 22  CREATININE 1.15  GLUCOSE 98  CALCIUM 9.6     11/05/12 - POC troponin: Negative 11/05/12 - 2v CXR: No active cardiopulmonary disease  Procedures: 11/05/12 - EKG: Normal  Aurelio Brash, MS IV   R2 Addendum:  I have seen the above patient and have discussed the patient's presentation, history, objective data, physical exam and assessment and plan with the [facility].  Briefly, this patient is a 72 y.o. year old male presenting with a 1 day onset of acute chest pain.  Has had similar episodes of right sided neck pain in the past that would occasionally radiate to the R chest.  Today radiating from both R and L neck into R and L chest.  Described as having a weight on his chest.  Usually 1X 325 ASA will  relieve this pain.  Today he took 3X325mg  ASA without resolution.  Went to local fire-department across street and was evaluated by EMS.  Had normal EKG but pt declined transport at the time due to complete resolution of his symptoms with only ASA.  Symptoms returned shortly after returning home.  Took 2 nitro-glycerin his son-in-law had and presented to [facility] ED.  FMTS asked to admit as Unassigned pt.  PCP Dr. Park Breed in [place].    Pt had + diaphoresis, no dyspnea, no Nausea/vomiting.  Has been having increased exertional fatigue lately.  There is no exertional component to his chest pain.   Hx, Labs & Vitals Reviewed: EKG: NSR, no ischemia  PE: GENERAL:  Elderly but spry appearing  male. In no discomfort; no respiratory distress. PSYCH: Alert and appropriately interactive; Insight:Good   H&N: AT/[place], trachea midline EENT:  MMM, no scleral icterus, EOMi HEART: RRR, S1/S2 heard, no murmur  LUNGS: CTA B, no wheezes, no crackles EXTREMITIES: Moves all 4 extremities spontaneously, warm well perfused, trace edema, bilateral DP and PT pulses 2/4.   A/P: 72 yo male with atypical chest pain.  To undergo ACS evaluation.  * Chest Pain: atypical chest pain.  HEART score of 4 (mod hx, 2 risk factors (HTN and fam hx), normal EKG, normal troponins, age 37) .  Will warrant stress testing but can be completed as OP unless pt rules in.  Will cycle cardiac enzymes and risk stratify.  ASA 3X325 on day of admission - 81mg  ASA daily [ ]  EKG in AM [ ]  Troponin's [ ]  Risk labs  * HTN:  On amlodipine.  No allergies, if continues to be elevated could consider addition or change in agents to ACEi [ ]  continue home amlodipine  * BPH - unclear of workup. Defer to OP PCP - continue home flomax  * Anxiety - chronic, on valium qhs - home valium  *Chronic Pain s/p implanted lumbar nerve stimulator.  Will continue home pain regimen.  Good functional class.  Followed at pain clinic. - Oxy IR 15mg  q4o prn   DISPO:   To tele.  Overnight ACS eval.  If negative can get stress test as OP.   Andrena Mews, DO [facility] Resident - PGY-2 11/05/2012 9:20 PM

## 2012-11-05 NOTE — ED Notes (Signed)
To ED for eval of CP for past 2 hrs. States he took ASA 3 325mg  with relief. Now pain has returned and is in left jaw. Took Nitro also. Skin w/d. resp e/u. No vomiting. No nausea. C/o weakness.

## 2012-11-05 NOTE — ED Notes (Signed)
Pt presented to ED with chest pain which started in his neck and down to his chest .

## 2012-11-06 ENCOUNTER — Encounter (HOSPITAL_COMMUNITY): Payer: Self-pay | Admitting: Cardiology

## 2012-11-06 DIAGNOSIS — R079 Chest pain, unspecified: Secondary | ICD-10-CM

## 2012-11-06 DIAGNOSIS — E785 Hyperlipidemia, unspecified: Secondary | ICD-10-CM

## 2012-11-06 DIAGNOSIS — I10 Essential (primary) hypertension: Secondary | ICD-10-CM

## 2012-11-06 LAB — CBC
MCH: 31.1 pg (ref 26.0–34.0)
MCHC: 34.3 g/dL (ref 30.0–36.0)
Platelets: 177 10*3/uL (ref 150–400)
RBC: 3.83 MIL/uL — ABNORMAL LOW (ref 4.22–5.81)
RDW: 13.6 % (ref 11.5–15.5)

## 2012-11-06 LAB — COMPREHENSIVE METABOLIC PANEL
AST: 17 U/L (ref 0–37)
Alkaline Phosphatase: 82 U/L (ref 39–117)
CO2: 28 mEq/L (ref 19–32)
Chloride: 100 mEq/L (ref 96–112)
Creatinine, Ser: 1.05 mg/dL (ref 0.50–1.35)
GFR calc non Af Amer: 68 mL/min — ABNORMAL LOW (ref >90)
Potassium: 3.9 mEq/L (ref 3.5–5.1)
Total Bilirubin: 0.5 mg/dL (ref 0.3–1.2)

## 2012-11-06 LAB — LIPID PANEL
Cholesterol: 216 mg/dL — ABNORMAL HIGH (ref 0–200)
HDL: 48 mg/dL (ref >39)
Total CHOL/HDL Ratio: 4.5 RATIO
Triglycerides: 138 mg/dL (ref <150)

## 2012-11-06 LAB — TSH: TSH: 1.41 u[IU]/mL (ref 0.350–4.500)

## 2012-11-06 MED ORDER — ATORVASTATIN CALCIUM 40 MG PO TABS
40.0000 mg | ORAL_TABLET | Freq: Every day | ORAL | Status: DC
  Administered 2012-11-06: 40 mg via ORAL
  Filled 2012-11-06 (×2): qty 1

## 2012-11-06 MED ORDER — AMLODIPINE BESYLATE 10 MG PO TABS
10.0000 mg | ORAL_TABLET | Freq: Every day | ORAL | Status: DC
  Administered 2012-11-07: 10 mg via ORAL
  Filled 2012-11-06: qty 1

## 2012-11-06 MED ORDER — REGADENOSON 0.4 MG/5ML IV SOLN
0.4000 mg | Freq: Once | INTRAVENOUS | Status: AC
  Administered 2012-11-07: 0.4 mg via INTRAVENOUS

## 2012-11-06 NOTE — H&P (Addendum)
FMTS Attending Admission Note: [name] MD Personal pager:  [phone] FPTS Service Pager:  [phone]  I  have seen and examined this patient, reviewed their chart. I have discussed this patient with the resident. I agree with the resident's findings, assessment and care plan.  Additionally:  72 yo Male with several week history of increasing chest pain with prolonged standing and movement.  Occasionally with CP described as "jaw pain extending to my chest." at rest.  Yesterday, had worsening chest pain with dyspnea and diaphoresis.  Presented to fire dept, recommended to come to ED, held off at that time. Took one 325 mg ASA and 2 of son's nitroglycerin and pain resolved.  Pain recurred later that afternoon and therefore patient presented to ED.  No history of exertional chest pain -- but he is unable to walk any distance at all secondary to chronic back/leg pain.  Son in room states he doesn't exert himself at all.  History of distant ETT but prior to chronic pain  Exam: HEENT:  [place]/AT, EOMI, MMM Neck:  No JVD Heart:  RRR.  No chest wall tenderness.  Lungs:  Clear Ext:  No edema  Imp/Plan: 1.  Chest pain: - atypical chest pain, but patient limited regarding exertion - Ruled out from ACS standpoint as no changes to EKG (sinus rhythm) and negative troponins; however intermediate risk of vascular event and therefore recommend cardiology consultation.  May not need to be inpatient for this.  TIMI score of 2. - Would not tolerate ETT testing at this time - Likely benefit from NTG on discharge.  - Not on statin at home -- needs this prior to DC.  Increase Norvasc.  Recommend daily ASA (only takes this "intermittently at home)   [name], MD 11/06/2012

## 2012-11-06 NOTE — Progress Notes (Signed)
FMTS Attending Daily Note:  Ronald Don MD  [phone] pager  Family Practice pager:  [phone] I have seen and examined this patient and have reviewed their chart. I have discussed this patient with the resident. I agree with the resident's findings, assessment and care plan.  Additionally:  See my separate admit note.   Ronald Grim, MD 11/06/2012 4:05 PM

## 2012-11-06 NOTE — Progress Notes (Signed)
Family Medicine Teaching Service Daily Progress Note Service Pager: [phone]  Subjective: Denies any further chest pain. No SOB. Looks forward to idea of going home today.  Objective: Temp:  [98.1 F (36.7 C)-98.7 F (37.1 C)] 98.3 F (36.8 C) (06/25 0618) Pulse Rate:  [73-83] 73 (06/25 0618) Resp:  [16-18] 18 (06/25 0618) BP: (154-183)/(73-101) 173/79 mmHg (06/25 0618) SpO2:  [91 %-99 %] 91 % (06/25 0618) Weight:  [185 lb 11.2 oz (84.233 kg)] 185 lb 11.2 oz (84.233 kg) (06/24 1729) Exam: General: NAD, lying in bed Head: NCAT Cardiovascular: RRR Respiratory: CTAB, NWOB Neuro: grossly nonfocal, speech intact  I have reviewed the patient's medications, labs, imaging, and diagnostic testing.  Notable results are summarized below.  Medications:  Scheduled Meds: . amLODipine  5 mg Oral Daily  . aspirin EC  81 mg Oral Daily  . DULoxetine  60 mg Oral Daily  . heparin  5,000 Units Subcutaneous Q8H  . sodium chloride  3 mL Intravenous Q12H  . sodium chloride  3 mL Intravenous Q12H  . tamsulosin  0.4 mg Oral Daily   Continuous Infusions:  PRN Meds:.sodium chloride, diazepam, oxyCODONE, sodium chloride  Labs:  CBC  Recent Labs Lab 11/05/12 1259 11/06/12 0520  WBC 4.7 4.6  HGB 13.0 11.9*  HCT 37.3* 34.7*  PLT 195 177     CMET  Recent Labs Lab 11/05/12 1259 11/06/12 0520  NA 137 138  K 4.5 3.9  CL 97 100  CO2 31 28  BUN 22 18  CREATININE 1.15 1.05  GLUCOSE 98 97  CALCIUM 9.6 8.9  AST  --  17  ALT  --  13  ALKPHOS  --  82  PROT  --  6.4  ALBUMIN  --  3.4*      Lipid Panel     Component Value Date/Time   CHOL 216* 11/06/2012 0520   TRIG 138 11/06/2012 0520   HDL 48 11/06/2012 0520   CHOLHDL 4.5 11/06/2012 0520   VLDL 28 11/06/2012 0520   LDLCALC 140* 11/06/2012 0520    Assessment/Plan:  72 yo male with atypical chest pain here for ACS ruleout  # Chest Pain: atypical chest pain. HEART score of 4 (mod hx, 2 risk factors (HTN and fam hx), normal EKG,  normal troponins, age 1) .  - 81mg  ASA daily  - trop neg x 3 - risk labs: A1c 5.8, LDL 140, TSH 1.41 - EKG nonischemic today - will start statin today (lipitor 40) [ ]  consult cards for evaluation either inpatient vs outpatient  # HTN: On amlodipine 5mg  but remained high overnight - increase amlodipine to 10mg  daily  # BPH - unclear of workup. Defer to OP PCP  - continue home flomax   # Anxiety - chronic, on valium qhs  - home valium   # Chronic Pain s/p implanted lumbar nerve stimulator. Will continue home pain regimen. Good functional class. Followed at pain clinic.  - Oxy IR 15mg  q4o prn   #Dispo: Pending cards consult, workup either as inpatient vs outpatient [ ]  recommend considering outpatient PFT's  Levert Feinstein, MD [facility] Medicine PGY-1 Service Pager [phone]

## 2012-11-06 NOTE — Consult Note (Signed)
Reason for Consult: chest pain  Referring Physician: TRH  HPI: The patient is a 72 y/o male with HTN and no past cardiac history, who presented to the [facility] ER yesterday with chest pain. He states that he has had intermittent episodes of chest pain for the past year, but states that the episodes were more frequent and more severe yesterday. The pain is described as a pressure like pain that starts in the right upper back, wraps around to the right neck and jaw, then radiates downward to the substernal area. It was non-exertional, non-positional and not pleuritic. Typically, his pain has been relieved after taking ASA. He took 2 ASA yesterday w/o relief. The pain was improved after taking SL NTG. He notes associated SOB, and diaphoresis. No n/v, syncope/presyncope. Since being admitted, he has had no further chest pain.   Past Medical History  Diagnosis Date  . Hypertension   . BPH (benign prostatic hyperplasia)   . Chronic pain syndrome   . Lumbar radicular pain   . Anxiety     Past Surgical History  Procedure Laterality Date  . Back surgery    . Cervical fusion    . Thyroidectomy    . Lumbar nerve stimlator insertion    . Colonoscopy      reported as normal    Family History  Problem Relation Age of Onset  . Coronary artery disease Father 20    CABG    Social History:  reports that he has never smoked. He has never used smokeless tobacco. He reports that he does not drink alcohol or use illicit drugs.  Allergies:  Allergies  Allergen Reactions  . Phenobarbital Rash    Medications:  Prior to Admission:  Prescriptions prior to admission  Medication Sig Dispense Refill  . amLODipine (NORVASC) 5 MG tablet Take 5 mg by mouth daily.      . celecoxib (CELEBREX) 200 MG capsule Take 200 mg by mouth daily.      . diazepam (VALIUM) 5 MG tablet Take 2.5 mg by mouth at bedtime as needed for anxiety.      . DULoxetine (CYMBALTA) 60 MG capsule Take 60 mg by mouth daily.      Marland Kitchen oxyCODONE  (ROXICODONE) 15 MG immediate release tablet Take 15 mg by mouth every 4 (four) hours as needed for pain. Up to 7 tablets in a day.      . tamsulosin (FLOMAX) 0.4 MG CAPS Take 0.4 mg by mouth daily.        Results for orders placed during the hospital encounter of 11/05/12 (from the past 48 hour(s))  CBC     Status: Abnormal   Collection Time    11/05/12 12:59 PM      Result Value Range   WBC 4.7  4.0 - 10.5 K/uL   RBC 4.06 (*) 4.22 - 5.81 MIL/uL   Hemoglobin 13.0  13.0 - 17.0 g/dL   HCT 16.1 (*) 09.6 - 04.5 %   MCV 91.9  78.0 - 100.0 fL   MCH 32.0  26.0 - 34.0 pg   MCHC 34.9  30.0 - 36.0 g/dL   RDW 40.9  81.1 - 91.4 %   Platelets 195  150 - 400 K/uL  BASIC METABOLIC PANEL     Status: Abnormal   Collection Time    11/05/12 12:59 PM      Result Value Range   Sodium 137  135 - 145 mEq/L   Potassium 4.5  3.5 - 5.1 mEq/L  Chloride 97  96 - 112 mEq/L   CO2 31  19 - 32 mEq/L   Glucose, Bld 98  70 - 99 mg/dL   BUN 22  6 - 23 mg/dL   Creatinine, Ser 7.82  0.50 - 1.35 mg/dL   Calcium 9.6  8.4 - 95.6 mg/dL   GFR calc non Af Amer 61 (*) >90 mL/min   GFR calc Af Amer 71 (*) >90 mL/min   Comment:            The eGFR has been calculated     using the CKD EPI equation.     This calculation has not been     validated in all clinical     situations.     eGFR's persistently     <90 mL/min signify     possible Chronic Kidney Disease.  POCT I-STAT TROPONIN I     Status: None   Collection Time    11/05/12  1:29 PM      Result Value Range   Troponin i, poc 0.00  0.00 - 0.08 ng/mL   Comment 3            Comment: Due to the release kinetics of cTnI,     a negative result within the first hours     of the onset of symptoms does not rule out     myocardial infarction with certainty.     If myocardial infarction is still suspected,     repeat the test at appropriate intervals.  POCT I-STAT TROPONIN I     Status: None   Collection Time    11/05/12  5:20 PM      Result Value Range    Troponin i, poc 0.00  0.00 - 0.08 ng/mL   Comment 3            Comment: Due to the release kinetics of cTnI,     a negative result within the first hours     of the onset of symptoms does not rule out     myocardial infarction with certainty.     If myocardial infarction is still suspected,     repeat the test at appropriate intervals.  TSH     Status: None   Collection Time    11/05/12  8:01 PM      Result Value Range   TSH 1.410  0.350 - 4.500 uIU/mL  HEMOGLOBIN A1C     Status: Abnormal   Collection Time    11/05/12  8:01 PM      Result Value Range   Hemoglobin A1C 5.8 (*) <5.7 %   Comment: (NOTE)                                                                               According to the ADA Clinical Practice Recommendations for 2011, when     HbA1c is used as a screening test:      >=6.5%   Diagnostic of Diabetes Mellitus               (if abnormal result is confirmed)     5.7-6.4%   Increased risk of developing Diabetes Mellitus  References:Diagnosis and Classification of Diabetes Mellitus,Diabetes     Care,2011,34(Suppl 1):S62-S69 and Standards of Medical Care in             Diabetes - 2011,Diabetes Care,2011,34 (Suppl 1):S11-S61.   Mean Plasma Glucose 120 (*) <117 mg/dL  TROPONIN I     Status: None   Collection Time    11/05/12  8:07 PM      Result Value Range   Troponin I <0.30  <0.30 ng/mL   Comment:            Due to the release kinetics of cTnI,     a negative result within the first hours     of the onset of symptoms does not rule out     myocardial infarction with certainty.     If myocardial infarction is still suspected,     repeat the test at appropriate intervals.  TROPONIN I     Status: None   Collection Time    11/05/12 10:48 PM      Result Value Range   Troponin I <0.30  <0.30 ng/mL   Comment:            Due to the release kinetics of cTnI,     a negative result within the first hours     of the onset of symptoms does not rule out      myocardial infarction with certainty.     If myocardial infarction is still suspected,     repeat the test at appropriate intervals.  COMPREHENSIVE METABOLIC PANEL     Status: Abnormal   Collection Time    11/06/12  5:20 AM      Result Value Range   Sodium 138  135 - 145 mEq/L   Potassium 3.9  3.5 - 5.1 mEq/L   Chloride 100  96 - 112 mEq/L   CO2 28  19 - 32 mEq/L   Glucose, Bld 97  70 - 99 mg/dL   BUN 18  6 - 23 mg/dL   Creatinine, Ser 1.61  0.50 - 1.35 mg/dL   Calcium 8.9  8.4 - 09.6 mg/dL   Total Protein 6.4  6.0 - 8.3 g/dL   Albumin 3.4 (*) 3.5 - 5.2 g/dL   AST 17  0 - 37 U/L   ALT 13  0 - 53 U/L   Alkaline Phosphatase 82  39 - 117 U/L   Total Bilirubin 0.5  0.3 - 1.2 mg/dL   GFR calc non Af Amer 68 (*) >90 mL/min   GFR calc Af Amer 79 (*) >90 mL/min   Comment:            The eGFR has been calculated     using the CKD EPI equation.     This calculation has not been     validated in all clinical     situations.     eGFR's persistently     <90 mL/min signify     possible Chronic Kidney Disease.  CBC     Status: Abnormal   Collection Time    11/06/12  5:20 AM      Result Value Range   WBC 4.6  4.0 - 10.5 K/uL   RBC 3.83 (*) 4.22 - 5.81 MIL/uL   Hemoglobin 11.9 (*) 13.0 - 17.0 g/dL   HCT 04.5 (*) 40.9 - 81.1 %   MCV 90.6  78.0 - 100.0 fL   MCH 31.1  26.0 - 34.0 pg   MCHC 34.3  30.0 - 36.0 g/dL   RDW 47.8  29.5 - 62.1 %   Platelets 177  150 - 400 K/uL  TROPONIN I     Status: None   Collection Time    11/06/12  5:20 AM      Result Value Range   Troponin I <0.30  <0.30 ng/mL   Comment:            Due to the release kinetics of cTnI,     a negative result within the first hours     of the onset of symptoms does not rule out     myocardial infarction with certainty.     If myocardial infarction is still suspected,     repeat the test at appropriate intervals.  LIPID PANEL     Status: Abnormal   Collection Time    11/06/12  5:20 AM      Result Value Range    Cholesterol 216 (*) 0 - 200 mg/dL   Triglycerides 308  <657 mg/dL   HDL 48  >84 mg/dL   Total CHOL/HDL Ratio 4.5     VLDL 28  0 - 40 mg/dL   LDL Cholesterol 696 (*) 0 - 99 mg/dL   Comment:            Total Cholesterol/HDL:CHD Risk     Coronary Heart Disease Risk Table                         Men   Women      1/2 Average Risk   3.4   3.3      Average Risk       5.0   4.4      2 X Average Risk   9.6   7.1      3 X Average Risk  23.4   11.0                Use the calculated Patient Ratio     above and the CHD Risk Table     to determine the patient's CHD Risk.                ATP III CLASSIFICATION (LDL):      <100     mg/dL   Optimal      295-284  mg/dL   Near or Above                        Optimal      130-159  mg/dL   Borderline      132-440  mg/dL   High      >102     mg/dL   Very High    Dg Chest 2 View  11/05/2012   *RADIOLOGY REPORT*  Clinical Data: Chest pain  CHEST - 2 VIEW  Comparison: 06/25/2012  Findings: Normal heart size.  Lungs are hyperaerated with bronchitic changes and interstitial prominence.  No pneumothorax. No pleural effusion.  Spinal stimulator is stable in the mid thoracic spinal canal.  IMPRESSION: No active cardiopulmonary disease.  Chronic changes are noted.   Original Report Authenticated By: Jolaine Click, M.D.    Review of Systems  Constitutional: Positive for diaphoresis.  Respiratory: Positive for shortness of breath.   Cardiovascular: Positive for chest pain.  Gastrointestinal: Negative for nausea and vomiting.  Neurological: Negative for dizziness and loss of consciousness.  All other systems reviewed and are negative.  Blood pressure 154/78, pulse 84, temperature 98.4 F (36.9 C), temperature source Oral, resp. rate 18, height 5\' 7"  (1.702 m), weight 185 lb 11.2 oz (84.233 kg), SpO2 94.00%. Physical Exam  Constitutional: He is oriented to person, place, and time. He appears well-developed and well-nourished. No distress.  HENT:  Head:  Normocephalic and atraumatic.  Eyes: Conjunctivae are normal. Pupils are equal, round, and reactive to light.  Neck: No JVD present. Carotid bruit is not present. No thyromegaly present.  Cardiovascular: Normal rate, regular rhythm, normal heart sounds and intact distal pulses.  Exam reveals no gallop and no friction rub.   No murmur heard. Pulses:      Radial pulses are 2+ on the right side, and 2+ on the left side.       Dorsalis pedis pulses are 1+ on the right side, and 2+ on the left side.  Respiratory: Breath sounds normal. He is in respiratory distress. He has no wheezes. He has no rales. He exhibits no tenderness.  GI: Soft. Bowel sounds are normal. He exhibits no distension and no mass. There is no tenderness. There is no guarding.  Musculoskeletal: He exhibits no edema.  Lymphadenopathy:    He has no cervical adenopathy.  Neurological: He is alert and oriented to person, place, and time.  Skin: Skin is warm and dry. He is not diaphoretic.  Psychiatric: He has a normal mood and affect. His behavior is normal.    Assessment/Plan: Principal Problem:   Chest pain at rest Active Problems:   HTN (hypertension)  Plan: he has ruled out for MI with negative troponin x 3. Serial EKGs have been normal and w/o acute change. He has had no further chest pain. The patient would benefit however from a NST. This can likely be done as an OP. Pt states that his 48 y/o mother was just admitted to the hospital in [facility], and he is eager to go home. Will have MD to see to determine. If home today will need to arrange OP f/u at [facility] for stress test. If plan is to keep inpatient, will need to arrange for Lexiscan NST in the AM. If home today, order SL NTG, PRN. The patient is also on Celebrex at home. Would recommend that the patient discontinue, as this may increase his risk for CV events.   Allayne Butcher, PA-C 11/06/2012, 5:15 PM    Patient seen and examined. Agree with assessment and  plan. 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 He was admitted yesterday after experiencing jaw discomfort with radiation to chest and chest pressure associated with increased BP, SOB and mild diaphoresis.  He denies definitive exertional precipitation. He did take an old sl NTG which did cause a headache and seemed to relieve the discomfort. However, after recurrent symptoms he presented for evaluation. ECG does not show any acute abnormality, enzymes negative. Recommend risk stratification of symptoms with a  lexiscan nuclear study. Will try to do tomorrow in hospital to expedite evaluation rather that as an outpatient. If nonischemic can probably be dc'd tomorrow later in day, but if positive for ischemia would then cath on Friday.   Lennette Bihari, MD, [facility] 11/06/2012 5:49 PM

## 2012-11-06 NOTE — Progress Notes (Signed)
Utilization review completed.  

## 2012-11-07 ENCOUNTER — Observation Stay (HOSPITAL_COMMUNITY): Payer: Medicare Other

## 2012-11-07 DIAGNOSIS — R0609 Other forms of dyspnea: Secondary | ICD-10-CM

## 2012-11-07 DIAGNOSIS — R079 Chest pain, unspecified: Secondary | ICD-10-CM

## 2012-11-07 DIAGNOSIS — R0989 Other specified symptoms and signs involving the circulatory and respiratory systems: Secondary | ICD-10-CM

## 2012-11-07 MED ORDER — ATORVASTATIN CALCIUM 40 MG PO TABS: 40.0000 mg | ORAL_TABLET | Freq: Every day | ORAL | Status: DC

## 2012-11-07 MED ORDER — LOSARTAN POTASSIUM 25 MG PO TABS
25.0000 mg | ORAL_TABLET | Freq: Every day | ORAL | Status: DC
  Administered 2012-11-07: 25 mg via ORAL
  Filled 2012-11-07: qty 1

## 2012-11-07 MED ORDER — TECHNETIUM TC 99M SESTAMIBI GENERIC - CARDIOLITE
10.0000 | Freq: Once | INTRAVENOUS | Status: AC | PRN
Start: 2012-11-07 — End: 2012-11-07
  Administered 2012-11-07: 10 via INTRAVENOUS

## 2012-11-07 MED ORDER — ASPIRIN 81 MG PO TBEC: 81.0000 mg | DELAYED_RELEASE_TABLET | Freq: Every day | ORAL | Status: DC

## 2012-11-07 MED ORDER — LOSARTAN POTASSIUM 25 MG PO TABS: 25.0000 mg | ORAL_TABLET | Freq: Every day | ORAL | Status: DC

## 2012-11-07 MED ORDER — TECHNETIUM TC 99M SESTAMIBI GENERIC - CARDIOLITE
30.0000 | Freq: Once | INTRAVENOUS | Status: AC | PRN
  Administered 2012-11-07: 30 via INTRAVENOUS

## 2012-11-07 NOTE — Discharge Summary (Signed)
Family Medicine Teaching [facility] Discharge Summary  Patient name: Ronald [name] Medical record number: [id number] Date of birth: Oct 19, 1940 Age: 72 y.o. Gender: male Date of Admission: 11/05/2012  Date of Discharge: 11/07/2012 Admitting Physician: [name], MD  Primary Care Provider: PCP in [place]  Indication for Hospitalization: Chest pain Discharge Diagnoses:  Chest pain HTN BPH Anxiety Chronic pain  Consultations: Lennette Bihari, MD in Cardiology  Significant Labs and Imaging:   Recent Labs Lab 11/05/12 1259 11/06/12 0520  WBC 4.7 4.6  HGB 13.0 11.9*  HCT 37.3* 34.7*  PLT 195 177    Recent Labs Lab 11/05/12 1259 11/06/12 0520  NA 137 138  K 4.5 3.9  CL 97 100  CO2 31 28  GLUCOSE 98 97  BUN 22 18  CREATININE 1.15 1.05  CALCIUM 9.6 8.9  ALKPHOS  --  82  AST  --  17  ALT  --  13  ALBUMIN  --  3.4*   11/05/12 - 2v CXR: No active cardiopulmonary disease   Procedures:  11/05/12 - EKG: Negative  11/06/12 - EKG: Negative 11/07/12 - Myoview - negative for ischemia, EF = 67%   Brief Hospital Course: [name] is a 72 yo male who presented with atypical chest pain and was admitted for ACS rule out. Please see H&P for more detail. Patient remained clinically stable during his admission with no reoccurrence of chest pain. The details of his hospital course are as below by problem.    # Non-cardiac Chest Pain: Patient presented with atypical chest pain with radiation to jaw bilaterally. Cardiac enzymes were negative x3. EKG on admission and repeat EKG the next morning were normal. HEART score of 4 (mod hx, 2 risk factors - HTN and fam hx, normal EKG, normal troponins, age 65). Patient remained stable and without chest pain for the duration of his stay. Cardiology recommended an inpatient nuclear stress test, which was performed on 6/26 and showed normal filling, no ischemia and EF of 67%. - Risk labs: A1c 5.8, LDL 140, TSH 1.41  - Initiated 81mg  ASA  daily on 6/25 - Initiated Lipitor 40mg  daily on 6/25 [ ]  discuss continued Celebrex use, low risk given normal EF and no evidence of ischemia but still would encourage to decrease use if possible  # HTN: Patient presented with elevated pressures 154-173/78-90 and could not recall if he had taken his home dose of 5mg  amlodipine.  Home amlodipine was restarted with continued BP elevation, so the dose was increased to 10mg  daily. - Initiated Cozaar 25mg  daily on 6/26, per Cardiology  # BPH - Unclear of workup. Defer to OP PCP. No acute issues during admission. Continued home Flomax   # Anxiety - Chronic, on Valium qhs at home, which was continued without issue.   # Chronic pain s/p implanted lumbar nerve stimulator: Followed at pain clinic. Continued home pain regimen of Oxy IR 15mg  q4o prn.    Discharge Medications:    Medication List    STOP taking these medications       celecoxib 200 MG capsule  Commonly known as:  CELEBREX      TAKE these medications       amLODipine 5 MG tablet  Commonly known as:  NORVASC  Take 5 mg by mouth daily.     aspirin 81 MG EC tablet  Take 1 tablet (81 mg total) by mouth daily.     atorvastatin 40 MG tablet  Commonly known as:  LIPITOR  Take 1 tablet (40 mg total) by mouth daily at 6 PM.     diazepam 5 MG tablet  Commonly known as:  VALIUM  Take 2.5 mg by mouth at bedtime as needed for anxiety.     DULoxetine 60 MG capsule  Commonly known as:  CYMBALTA  Take 60 mg by mouth daily.     losartan 25 MG tablet  Commonly known as:  COZAAR  Take 1 tablet (25 mg total) by mouth daily.     oxyCODONE 15 MG immediate release tablet  Commonly known as:  ROXICODONE  Take 15 mg by mouth every 4 (four) hours as needed for pain. Up to 7 tablets in a day.     tamsulosin 0.4 MG Caps  Commonly known as:  FLOMAX  Take 0.4 mg by mouth daily.        Discharge Exam: Temp:  [98.2 F (36.8 C)-98.9 F (37.2 C)] 98.9 F (37.2 C) (06/26 0444) Pulse  Rate:  [58-89] 85 (06/26 0938) Resp:  [18] 18 (06/26 0444) BP: (128-166)/(61-90) 154/62 mmHg (06/26 0938) SpO2:  [92 %-97 %] 97 % (06/26 0444)  PE: GENERAL:  Adult caucaian  male. Laying comfortably In no discomfort; no respiratory distress. PSYCH: Alert and appropriately interactive; Insight:Good   H&N: AT/[place], trachea midline EENT:  MMM, no scleral icterus, EOMi HEART: RRR, S1/S2 heard, no murmur LUNGS: CTA B, no wheezes, no crackles EXTREMITIES: Moves all 4 extremities spontaneously, warm well perfused, no edema, bilateral DP and PT pulses 2/4.  Guarded movements in general but no lateralization   Disposition: Self-care at home  Issues for Follow Up:  [ ]  Consider outpatient PFTs to evaluate Dyspnea on Exertion  Outstanding Results: None  Discharge Instructions: Please refer to Patient Instructions section of EMR for full details.  Patient was counseled important signs and symptoms that should prompt return to medical care, changes in medications, dietary instructions, activity restrictions, and follow up appointments.   Follow-up Information   Please follow up.   Contact information:   call to schedule with your primary doctor for in 1-2 weeks     Discharge Condition: Clinically stable with negative cardiac workup  I have formulated this d/c summary with OMS IV Aurelio Brash.  Physical exam is PGY-2's exam.  Andrena Mews, DO [facility] Resident - PGY-2 11/07/2012 6:16 PM

## 2012-11-07 NOTE — Progress Notes (Signed)
Utilization review completed.  

## 2012-11-07 NOTE — Progress Notes (Signed)
Family Medicine Teaching Service Daily Progress Note Intern Pager: [phone]  Patient name: Ronald [name] Medical record number: [id number] Date of birth: [birth year] Age: 72 y.o. Gender: male  Primary Care Provider: PCP in [place]  Subjective: Patient says he is doing well today. Denies any recurrence of chest pain, SOB, or other any complaints. He does endorse some hunger as he has been NPO since midnight for his nuclear stress test scheduled for today.   Objective: Temp:  [98.2 F (36.8 C)-98.9 F (37.2 C)] 98.9 F (37.2 C) (06/26 0444) Pulse Rate:  [58-84] 58 (06/26 0444) Resp:  [18] 18 (06/26 0444) BP: (128-160)/(74-90) 128/86 mmHg (06/26 0444) SpO2:  [92 %-97 %] 97 % (06/26 0444) Exam: General: Well-appearing, sitting comfortably in bed, no distress Cardiovascular: RRR, no murmur heard Respiratory: No increased work of breathing, CTAB Abdomen: Soft, no TTP, active bowel sounds  Laboratory:  Recent Labs Lab 11/05/12 1259 11/06/12 0520  WBC 4.7 4.6  HGB 13.0 11.9*  HCT 37.3* 34.7*  PLT 195 177    Recent Labs Lab 11/05/12 1259 11/06/12 0520  NA 137 138  K 4.5 3.9  CL 97 100  CO2 31 28  BUN 22 18  CREATININE 1.15 1.05  CALCIUM 9.6 8.9  PROT  --  6.4  BILITOT  --  0.5  ALKPHOS  --  82  ALT  --  13  AST  --  17  GLUCOSE 98 97    Imaging/Diagnostic Tests: 11/05/12 - 2v CXR: No active cardiopulmonary disease 11/05/12 - EKG: Negative 11/06/12 - EKG: Negative   Assessment and Plan: 72 yo male with atypical chest pain here for ACS ruleout   # Chest Pain: atypical chest pain. HEART score of 4 (mod hx, 2 risk factors (HTN and fam hx), normal EKG, normal troponins, age 28) . Per cards, patient to have nuclear stress test today; if ischemic, will need cath tomorrow. - 81mg  ASA daily  - Troponins neg x 3  - Risk labs: A1c 5.8, LDL 140, TSH 1.41  - Normal, non-ischemic EKG x2  - Continue Lipitor 40mg  daily  [ ]  F/u nuclear stress test results today; d/c home if  non-ischemic  # HTN: On amlodipine 5mg  but remained high overnight  - Increase amlodipine to 10mg  daily   # BPH - unclear of workup. Defer to OP PCP  - Continue home flomax   # Anxiety - chronic, on valium qhs  - Continue home valium   # Chronic pain s/p implanted lumbar nerve stimulator: Followed at pain clinic. - Will continue home pain regimen of oxy IR 15mg  q4o prn   Dispo: Pending nuclear stress test results [ ]  recommend considering outpatient PFT's  FEN/GI: NPO until results of nuclear stress test PPx: SQ Heparin Code Status: FULL, as discussed on admission  Katie T Cheng, Med Student 11/07/2012, 8:39 AM PGY-1, [facility] Family Medicine FPTS Intern pager: [phone]    FMTS Attending Daily Note:  [name] MD  [phone] pager  Family Practice pager:  [phone] I have seen and examined this patient and have reviewed their chart. I have discussed this patient with the resident. I agree with the resident's findings, assessment and care plan.  Additionally: Pt feels well.  No trouble with stress test  Exam: Gen:  Awake, alert, NAD Heart:  RRR, no murmurs Lungs:  Clear Abd:  Soft/nondistended/nontender.  Good bowel sounds throughout all four quadrants.  No masses noted.  Ext:  No edema  Imp/Plan: 1.  Chest  pain: - ruled out from ACS standpoint.  Being evaluated for any ischemia (reason for stress test).   - If negative, DC home today. Otherwise for cath tomorrow  2.  HTN: - increase Norvasc.  3.  HLD: - starting statin this hospitalization.   4.  Chronic pain: - no changes.  Per outpt PCP  [name], MD 11/07/2012 4:00 PM

## 2012-11-07 NOTE — Progress Notes (Signed)
Subjective: No further CP.  Objective: Vital signs in last 24 hours: Temp:  [98.2 F (36.8 C)-98.9 F (37.2 C)] 98.9 F (37.2 C) (06/26 0444) Pulse Rate:  [58-89] 85 (06/26 0938) Resp:  [18] 18 (06/26 0444) BP: (128-166)/(61-90) 154/62 mmHg (06/26 0938) SpO2:  [92 %-97 %] 97 % (06/26 0444) Last BM Date: 11/05/12  Intake/Output from previous day: 06/25 0701 - 06/26 0700 In: 483 [P.O.:480; I.V.:3] Out: -  Intake/Output this shift:    Medications Current Facility-Administered Medications  Medication Dose Route Frequency Provider Last Rate Last Dose  . 0.9 %  sodium chloride infusion  250 mL Intravenous PRN Andrena Mews, DO      . amLODipine (NORVASC) tablet 10 mg  10 mg Oral Daily Latrelle Dodrill, MD      . aspirin EC tablet 81 mg  81 mg Oral Daily Andrena Mews, DO   81 mg at 11/06/12 0945  . atorvastatin (LIPITOR) tablet 40 mg  40 mg Oral q1800 Latrelle Dodrill, MD   40 mg at 11/06/12 1810  . diazepam (VALIUM) tablet 2.5 mg  2.5 mg Oral QHS PRN Andrena Mews, DO   2.5 mg at 11/06/12 2130  . DULoxetine (CYMBALTA) DR capsule 60 mg  60 mg Oral Daily Andrena Mews, DO   60 mg at 11/06/12 0945  . heparin injection 5,000 Units  5,000 Units Subcutaneous Q8H Andrena Mews, DO   5,000 Units at 11/07/12 1478  . oxyCODONE (Oxy IR/ROXICODONE) immediate release tablet 15 mg  15 mg Oral Q4H PRN Andrena Mews, DO   15 mg at 11/07/12 2956  . sodium chloride 0.9 % injection 3 mL  3 mL Intravenous Q12H Andrena Mews, DO      . sodium chloride 0.9 % injection 3 mL  3 mL Intravenous Q12H Andrena Mews, DO   3 mL at 11/06/12 2130  . sodium chloride 0.9 % injection 3 mL  3 mL Intravenous PRN Andrena Mews, DO      . tamsulosin [facility]) capsule 0.4 mg  0.4 mg Oral Daily Andrena Mews, DO   0.4 mg at 11/06/12 0945    PE: General appearance: alert, cooperative and no distress Lungs: clear to auscultation bilaterally Heart: regular rate and rhythm, S1, S2 normal, no  murmur, click, rub or gallop Extremities: No LEE Pulses: 2+ and symmetric Neurologic: Grossly normal  Lab Results:   Recent Labs  11/05/12 1259 11/06/12 0520  WBC 4.7 4.6  HGB 13.0 11.9*  HCT 37.3* 34.7*  PLT 195 177   BMET  Recent Labs  11/05/12 1259 11/06/12 0520  NA 137 138  K 4.5 3.9  CL 97 100  CO2 31 28  GLUCOSE 98 97  BUN 22 18  CREATININE 1.15 1.05  CALCIUM 9.6 8.9   PT/INR No results found for this basename: LABPROT, INR,  in the last 72 hours Cholesterol  Recent Labs  11/06/12 0520  CHOL 216*   Lipid Panel     Component Value Date/Time   CHOL 216* 11/06/2012 0520   TRIG 138 11/06/2012 0520   HDL 48 11/06/2012 0520   CHOLHDL 4.5 11/06/2012 0520   VLDL 28 11/06/2012 0520   LDLCALC 140* 11/06/2012 0520   Cardiac Panel (last 3 results)  Recent Labs  11/05/12 2007 11/05/12 2248 11/06/12 0520  TROPONINI <0.30 <0.30 <0.30    Assessment/Plan  Principal Problem:   Chest pain at rest Active Problems:   HTN (  hypertension)  Plan:  No further CP.  Ruled out for MI.  He tolerated the lexiscan very well.  MD to review.   On lipitor.  LDL 140 however, VLDL low.  TG and HDL good.   A1C: 5.8.  Will add ARB to BP meds.    LOS: 2 days    Alandra Sando 11/07/2012 10:13 AM

## 2012-11-07 NOTE — Progress Notes (Signed)
I have seen and evaluated the patient this PM after Wilburt Finlay, PA & hiws NST. I agree with his findings, examination as well as impression recommendations.  Did well with NST - no ischemia or infarction -- normal EF.   This would argue against ischemic CAD as a source of CP.   OK to d/c home from Cardiology standpoint -- f/u with PCP>   MD Time with pt: 5 min  Joel Mericle W, M.D., M.S. [facility] [address] [place], [place]  [address]  [phone] Pager # [phone] 11/07/2012 4:42 PM

## 2012-11-08 MED FILL — Regadenoson IV Inj 0.4 MG/5ML (0.08 MG/ML): INTRAVENOUS | Qty: 5 | Status: AC

## 2012-11-09 NOTE — Discharge Summary (Signed)
Family Medicine Teaching Service  Discharge Note : Attending Jeff Walden MD Pager [phone] Inpatient Team Pager:  [phone]  I have reviewed this patient and the patient's chart and have discussed discharge planning with the resident at the time of discharge. I agree with the discharge plan as above.    

## 2013-05-07 ENCOUNTER — Other Ambulatory Visit: Payer: Self-pay | Admitting: Specialist

## 2013-05-07 DIAGNOSIS — M542 Cervicalgia: Secondary | ICD-10-CM

## 2013-05-13 ENCOUNTER — Ambulatory Visit
Admission: RE | Admit: 2013-05-13 | Discharge: 2013-05-13 | Disposition: A | Discharge status: Home / Self Care | Payer: Medicare Other | Source: Ambulatory Visit | Attending: Specialist | Admitting: Specialist

## 2013-05-13 ENCOUNTER — Other Ambulatory Visit: Payer: Self-pay | Admitting: Specialist

## 2013-05-13 ENCOUNTER — Other Ambulatory Visit: Payer: Medicare Other

## 2013-05-13 DIAGNOSIS — M542 Cervicalgia: Secondary | ICD-10-CM

## 2014-05-18 DIAGNOSIS — G894 Chronic pain syndrome: Secondary | ICD-10-CM | POA: Diagnosis not present

## 2014-05-18 DIAGNOSIS — M961 Postlaminectomy syndrome, not elsewhere classified: Secondary | ICD-10-CM | POA: Diagnosis not present

## 2014-05-18 DIAGNOSIS — M47812 Spondylosis without myelopathy or radiculopathy, cervical region: Secondary | ICD-10-CM | POA: Diagnosis not present

## 2014-05-18 DIAGNOSIS — M47817 Spondylosis without myelopathy or radiculopathy, lumbosacral region: Secondary | ICD-10-CM | POA: Diagnosis not present

## 2014-05-30 DIAGNOSIS — L2089 Other atopic dermatitis: Secondary | ICD-10-CM | POA: Diagnosis not present

## 2014-06-18 DIAGNOSIS — M961 Postlaminectomy syndrome, not elsewhere classified: Secondary | ICD-10-CM | POA: Diagnosis not present

## 2014-06-18 DIAGNOSIS — G894 Chronic pain syndrome: Secondary | ICD-10-CM | POA: Diagnosis not present

## 2014-06-18 DIAGNOSIS — M47817 Spondylosis without myelopathy or radiculopathy, lumbosacral region: Secondary | ICD-10-CM | POA: Diagnosis not present

## 2014-06-18 DIAGNOSIS — M47812 Spondylosis without myelopathy or radiculopathy, cervical region: Secondary | ICD-10-CM | POA: Diagnosis not present

## 2014-07-16 DIAGNOSIS — R0789 Other chest pain: Secondary | ICD-10-CM | POA: Diagnosis not present

## 2014-07-16 DIAGNOSIS — E780 Pure hypercholesterolemia: Secondary | ICD-10-CM | POA: Diagnosis not present

## 2014-07-16 DIAGNOSIS — Z9181 History of falling: Secondary | ICD-10-CM | POA: Diagnosis not present

## 2014-07-17 DIAGNOSIS — M47817 Spondylosis without myelopathy or radiculopathy, lumbosacral region: Secondary | ICD-10-CM | POA: Diagnosis not present

## 2014-07-17 DIAGNOSIS — Z79899 Other long term (current) drug therapy: Secondary | ICD-10-CM | POA: Diagnosis not present

## 2014-07-17 DIAGNOSIS — Z125 Encounter for screening for malignant neoplasm of prostate: Secondary | ICD-10-CM | POA: Diagnosis not present

## 2014-07-17 DIAGNOSIS — M961 Postlaminectomy syndrome, not elsewhere classified: Secondary | ICD-10-CM | POA: Diagnosis not present

## 2014-07-17 DIAGNOSIS — M47812 Spondylosis without myelopathy or radiculopathy, cervical region: Secondary | ICD-10-CM | POA: Diagnosis not present

## 2014-07-17 DIAGNOSIS — G894 Chronic pain syndrome: Secondary | ICD-10-CM | POA: Diagnosis not present

## 2014-07-17 DIAGNOSIS — E780 Pure hypercholesterolemia: Secondary | ICD-10-CM | POA: Diagnosis not present

## 2014-08-17 DIAGNOSIS — M47812 Spondylosis without myelopathy or radiculopathy, cervical region: Secondary | ICD-10-CM | POA: Diagnosis not present

## 2014-08-17 DIAGNOSIS — M47817 Spondylosis without myelopathy or radiculopathy, lumbosacral region: Secondary | ICD-10-CM | POA: Diagnosis not present

## 2014-08-17 DIAGNOSIS — M961 Postlaminectomy syndrome, not elsewhere classified: Secondary | ICD-10-CM | POA: Diagnosis not present

## 2014-08-17 DIAGNOSIS — G894 Chronic pain syndrome: Secondary | ICD-10-CM | POA: Diagnosis not present

## 2014-09-02 DIAGNOSIS — S60051A Contusion of right little finger without damage to nail, initial encounter: Secondary | ICD-10-CM | POA: Diagnosis not present

## 2014-09-02 DIAGNOSIS — S61214A Laceration without foreign body of right ring finger without damage to nail, initial encounter: Secondary | ICD-10-CM | POA: Diagnosis not present

## 2014-09-02 DIAGNOSIS — S60041A Contusion of right ring finger without damage to nail, initial encounter: Secondary | ICD-10-CM | POA: Diagnosis not present

## 2014-09-02 DIAGNOSIS — S61216A Laceration without foreign body of right little finger without damage to nail, initial encounter: Secondary | ICD-10-CM | POA: Diagnosis not present

## 2014-09-16 DIAGNOSIS — M961 Postlaminectomy syndrome, not elsewhere classified: Secondary | ICD-10-CM | POA: Diagnosis not present

## 2014-09-16 DIAGNOSIS — G894 Chronic pain syndrome: Secondary | ICD-10-CM | POA: Diagnosis not present

## 2014-09-16 DIAGNOSIS — M47817 Spondylosis without myelopathy or radiculopathy, lumbosacral region: Secondary | ICD-10-CM | POA: Diagnosis not present

## 2014-09-16 DIAGNOSIS — M47812 Spondylosis without myelopathy or radiculopathy, cervical region: Secondary | ICD-10-CM | POA: Diagnosis not present

## 2014-10-16 DIAGNOSIS — G894 Chronic pain syndrome: Secondary | ICD-10-CM | POA: Diagnosis not present

## 2014-10-16 DIAGNOSIS — M961 Postlaminectomy syndrome, not elsewhere classified: Secondary | ICD-10-CM | POA: Diagnosis not present

## 2014-10-16 DIAGNOSIS — M47812 Spondylosis without myelopathy or radiculopathy, cervical region: Secondary | ICD-10-CM | POA: Diagnosis not present

## 2014-10-16 DIAGNOSIS — M47817 Spondylosis without myelopathy or radiculopathy, lumbosacral region: Secondary | ICD-10-CM | POA: Diagnosis not present

## 2014-12-17 DIAGNOSIS — G894 Chronic pain syndrome: Secondary | ICD-10-CM | POA: Diagnosis not present

## 2014-12-17 DIAGNOSIS — M47812 Spondylosis without myelopathy or radiculopathy, cervical region: Secondary | ICD-10-CM | POA: Diagnosis not present

## 2014-12-17 DIAGNOSIS — M961 Postlaminectomy syndrome, not elsewhere classified: Secondary | ICD-10-CM | POA: Diagnosis not present

## 2014-12-17 DIAGNOSIS — M47817 Spondylosis without myelopathy or radiculopathy, lumbosacral region: Secondary | ICD-10-CM | POA: Diagnosis not present

## 2014-12-17 DIAGNOSIS — Z79891 Long term (current) use of opiate analgesic: Secondary | ICD-10-CM | POA: Diagnosis not present

## 2015-01-15 DIAGNOSIS — M47812 Spondylosis without myelopathy or radiculopathy, cervical region: Secondary | ICD-10-CM | POA: Diagnosis not present

## 2015-01-15 DIAGNOSIS — M47817 Spondylosis without myelopathy or radiculopathy, lumbosacral region: Secondary | ICD-10-CM | POA: Diagnosis not present

## 2015-01-15 DIAGNOSIS — M961 Postlaminectomy syndrome, not elsewhere classified: Secondary | ICD-10-CM | POA: Diagnosis not present

## 2015-01-15 DIAGNOSIS — G894 Chronic pain syndrome: Secondary | ICD-10-CM | POA: Diagnosis not present

## 2015-02-16 DIAGNOSIS — G894 Chronic pain syndrome: Secondary | ICD-10-CM | POA: Diagnosis not present

## 2015-02-16 DIAGNOSIS — M47812 Spondylosis without myelopathy or radiculopathy, cervical region: Secondary | ICD-10-CM | POA: Diagnosis not present

## 2015-02-16 DIAGNOSIS — M961 Postlaminectomy syndrome, not elsewhere classified: Secondary | ICD-10-CM | POA: Diagnosis not present

## 2015-02-16 DIAGNOSIS — M47817 Spondylosis without myelopathy or radiculopathy, lumbosacral region: Secondary | ICD-10-CM | POA: Diagnosis not present

## 2015-02-18 DIAGNOSIS — M47812 Spondylosis without myelopathy or radiculopathy, cervical region: Secondary | ICD-10-CM | POA: Diagnosis not present

## 2015-02-18 DIAGNOSIS — M47817 Spondylosis without myelopathy or radiculopathy, lumbosacral region: Secondary | ICD-10-CM | POA: Diagnosis not present

## 2015-02-18 DIAGNOSIS — G894 Chronic pain syndrome: Secondary | ICD-10-CM | POA: Diagnosis not present

## 2015-02-18 DIAGNOSIS — M961 Postlaminectomy syndrome, not elsewhere classified: Secondary | ICD-10-CM | POA: Diagnosis not present

## 2015-03-19 DIAGNOSIS — M961 Postlaminectomy syndrome, not elsewhere classified: Secondary | ICD-10-CM | POA: Diagnosis not present

## 2015-03-19 DIAGNOSIS — M47817 Spondylosis without myelopathy or radiculopathy, lumbosacral region: Secondary | ICD-10-CM | POA: Diagnosis not present

## 2015-03-19 DIAGNOSIS — G894 Chronic pain syndrome: Secondary | ICD-10-CM | POA: Diagnosis not present

## 2015-03-19 DIAGNOSIS — M47812 Spondylosis without myelopathy or radiculopathy, cervical region: Secondary | ICD-10-CM | POA: Diagnosis not present

## 2015-04-19 DIAGNOSIS — G894 Chronic pain syndrome: Secondary | ICD-10-CM | POA: Diagnosis not present

## 2015-04-19 DIAGNOSIS — M47812 Spondylosis without myelopathy or radiculopathy, cervical region: Secondary | ICD-10-CM | POA: Diagnosis not present

## 2015-04-19 DIAGNOSIS — M961 Postlaminectomy syndrome, not elsewhere classified: Secondary | ICD-10-CM | POA: Diagnosis not present

## 2015-04-19 DIAGNOSIS — M47817 Spondylosis without myelopathy or radiculopathy, lumbosacral region: Secondary | ICD-10-CM | POA: Diagnosis not present

## 2015-04-19 DIAGNOSIS — Z79891 Long term (current) use of opiate analgesic: Secondary | ICD-10-CM | POA: Diagnosis not present

## 2015-05-06 DIAGNOSIS — F331 Major depressive disorder, recurrent, moderate: Secondary | ICD-10-CM | POA: Diagnosis not present

## 2015-05-06 DIAGNOSIS — I10 Essential (primary) hypertension: Secondary | ICD-10-CM | POA: Diagnosis not present

## 2015-05-06 DIAGNOSIS — Z0000 Encounter for general adult medical examination without abnormal findings: Secondary | ICD-10-CM | POA: Diagnosis not present

## 2015-05-06 DIAGNOSIS — G5793 Unspecified mononeuropathy of bilateral lower limbs: Secondary | ICD-10-CM | POA: Diagnosis not present

## 2015-05-06 DIAGNOSIS — E7800 Pure hypercholesterolemia, unspecified: Secondary | ICD-10-CM | POA: Diagnosis not present

## 2015-05-20 DIAGNOSIS — M47817 Spondylosis without myelopathy or radiculopathy, lumbosacral region: Secondary | ICD-10-CM | POA: Diagnosis not present

## 2015-05-20 DIAGNOSIS — M47812 Spondylosis without myelopathy or radiculopathy, cervical region: Secondary | ICD-10-CM | POA: Diagnosis not present

## 2015-05-20 DIAGNOSIS — G894 Chronic pain syndrome: Secondary | ICD-10-CM | POA: Diagnosis not present

## 2015-05-20 DIAGNOSIS — M961 Postlaminectomy syndrome, not elsewhere classified: Secondary | ICD-10-CM | POA: Diagnosis not present

## 2015-06-11 DIAGNOSIS — J209 Acute bronchitis, unspecified: Secondary | ICD-10-CM | POA: Diagnosis not present

## 2015-06-11 DIAGNOSIS — J069 Acute upper respiratory infection, unspecified: Secondary | ICD-10-CM | POA: Diagnosis not present

## 2015-06-18 DIAGNOSIS — M47817 Spondylosis without myelopathy or radiculopathy, lumbosacral region: Secondary | ICD-10-CM | POA: Diagnosis not present

## 2015-06-18 DIAGNOSIS — M47812 Spondylosis without myelopathy or radiculopathy, cervical region: Secondary | ICD-10-CM | POA: Diagnosis not present

## 2015-06-18 DIAGNOSIS — M961 Postlaminectomy syndrome, not elsewhere classified: Secondary | ICD-10-CM | POA: Diagnosis not present

## 2015-06-18 DIAGNOSIS — G894 Chronic pain syndrome: Secondary | ICD-10-CM | POA: Diagnosis not present

## 2015-06-22 DIAGNOSIS — B370 Candidal stomatitis: Secondary | ICD-10-CM | POA: Diagnosis not present

## 2015-06-22 DIAGNOSIS — J069 Acute upper respiratory infection, unspecified: Secondary | ICD-10-CM | POA: Diagnosis not present

## 2015-06-22 DIAGNOSIS — J209 Acute bronchitis, unspecified: Secondary | ICD-10-CM | POA: Diagnosis not present

## 2015-07-15 DIAGNOSIS — M47817 Spondylosis without myelopathy or radiculopathy, lumbosacral region: Secondary | ICD-10-CM | POA: Diagnosis not present

## 2015-07-15 DIAGNOSIS — M47812 Spondylosis without myelopathy or radiculopathy, cervical region: Secondary | ICD-10-CM | POA: Diagnosis not present

## 2015-07-15 DIAGNOSIS — G894 Chronic pain syndrome: Secondary | ICD-10-CM | POA: Diagnosis not present

## 2015-07-15 DIAGNOSIS — M961 Postlaminectomy syndrome, not elsewhere classified: Secondary | ICD-10-CM | POA: Diagnosis not present

## 2015-07-19 DIAGNOSIS — J40 Bronchitis, not specified as acute or chronic: Secondary | ICD-10-CM | POA: Diagnosis not present

## 2015-07-29 DIAGNOSIS — R972 Elevated prostate specific antigen [PSA]: Secondary | ICD-10-CM | POA: Diagnosis not present

## 2015-07-29 DIAGNOSIS — J209 Acute bronchitis, unspecified: Secondary | ICD-10-CM | POA: Diagnosis not present

## 2015-07-29 DIAGNOSIS — E785 Hyperlipidemia, unspecified: Secondary | ICD-10-CM | POA: Diagnosis not present

## 2015-07-29 DIAGNOSIS — Z79899 Other long term (current) drug therapy: Secondary | ICD-10-CM | POA: Diagnosis not present

## 2015-07-29 DIAGNOSIS — B353 Tinea pedis: Secondary | ICD-10-CM | POA: Diagnosis not present

## 2015-08-17 DIAGNOSIS — Z79891 Long term (current) use of opiate analgesic: Secondary | ICD-10-CM | POA: Diagnosis not present

## 2015-08-17 DIAGNOSIS — M47812 Spondylosis without myelopathy or radiculopathy, cervical region: Secondary | ICD-10-CM | POA: Diagnosis not present

## 2015-08-17 DIAGNOSIS — G894 Chronic pain syndrome: Secondary | ICD-10-CM | POA: Diagnosis not present

## 2015-08-17 DIAGNOSIS — M47817 Spondylosis without myelopathy or radiculopathy, lumbosacral region: Secondary | ICD-10-CM | POA: Diagnosis not present

## 2015-08-17 DIAGNOSIS — M961 Postlaminectomy syndrome, not elsewhere classified: Secondary | ICD-10-CM | POA: Diagnosis not present

## 2015-09-16 DIAGNOSIS — M961 Postlaminectomy syndrome, not elsewhere classified: Secondary | ICD-10-CM | POA: Diagnosis not present

## 2015-09-16 DIAGNOSIS — G894 Chronic pain syndrome: Secondary | ICD-10-CM | POA: Diagnosis not present

## 2015-09-16 DIAGNOSIS — M47812 Spondylosis without myelopathy or radiculopathy, cervical region: Secondary | ICD-10-CM | POA: Diagnosis not present

## 2015-09-16 DIAGNOSIS — M47817 Spondylosis without myelopathy or radiculopathy, lumbosacral region: Secondary | ICD-10-CM | POA: Diagnosis not present

## 2015-10-18 DIAGNOSIS — M961 Postlaminectomy syndrome, not elsewhere classified: Secondary | ICD-10-CM | POA: Diagnosis not present

## 2015-10-18 DIAGNOSIS — M47812 Spondylosis without myelopathy or radiculopathy, cervical region: Secondary | ICD-10-CM | POA: Diagnosis not present

## 2015-10-18 DIAGNOSIS — G894 Chronic pain syndrome: Secondary | ICD-10-CM | POA: Diagnosis not present

## 2015-10-18 DIAGNOSIS — M47817 Spondylosis without myelopathy or radiculopathy, lumbosacral region: Secondary | ICD-10-CM | POA: Diagnosis not present

## 2015-10-26 DIAGNOSIS — B353 Tinea pedis: Secondary | ICD-10-CM | POA: Diagnosis not present

## 2015-10-26 DIAGNOSIS — M961 Postlaminectomy syndrome, not elsewhere classified: Secondary | ICD-10-CM | POA: Diagnosis not present

## 2015-11-02 ENCOUNTER — Observation Stay (HOSPITAL_COMMUNITY)
Admission: EM | Admit: 2015-11-02 | Discharge: 2015-11-03 | Disposition: A | Discharge status: Home / Self Care | Payer: Medicare Other | Attending: Student in an Organized Health Care Education/Training Program | Admitting: Student in an Organized Health Care Education/Training Program

## 2015-11-02 ENCOUNTER — Observation Stay (HOSPITAL_COMMUNITY): Payer: Medicare Other

## 2015-11-02 ENCOUNTER — Other Ambulatory Visit: Payer: Self-pay

## 2015-11-02 ENCOUNTER — Emergency Department (HOSPITAL_COMMUNITY): Payer: Medicare Other

## 2015-11-02 ENCOUNTER — Encounter (HOSPITAL_COMMUNITY): Payer: Self-pay | Admitting: Emergency Medicine

## 2015-11-02 DIAGNOSIS — M549 Dorsalgia, unspecified: Secondary | ICD-10-CM | POA: Diagnosis not present

## 2015-11-02 DIAGNOSIS — R4182 Altered mental status, unspecified: Secondary | ICD-10-CM | POA: Diagnosis not present

## 2015-11-02 DIAGNOSIS — F419 Anxiety disorder, unspecified: Secondary | ICD-10-CM | POA: Diagnosis not present

## 2015-11-02 DIAGNOSIS — R05 Cough: Secondary | ICD-10-CM | POA: Diagnosis not present

## 2015-11-02 DIAGNOSIS — I10 Essential (primary) hypertension: Secondary | ICD-10-CM | POA: Diagnosis not present

## 2015-11-02 DIAGNOSIS — J189 Pneumonia, unspecified organism: Secondary | ICD-10-CM | POA: Diagnosis not present

## 2015-11-02 DIAGNOSIS — R569 Unspecified convulsions: Secondary | ICD-10-CM

## 2015-11-02 DIAGNOSIS — G894 Chronic pain syndrome: Secondary | ICD-10-CM | POA: Insufficient documentation

## 2015-11-02 DIAGNOSIS — R402411 Glasgow coma scale score 13-15, in the field [EMT or ambulance]: Secondary | ICD-10-CM | POA: Diagnosis not present

## 2015-11-02 DIAGNOSIS — Z7982 Long term (current) use of aspirin: Secondary | ICD-10-CM | POA: Insufficient documentation

## 2015-11-02 DIAGNOSIS — Z79899 Other long term (current) drug therapy: Secondary | ICD-10-CM

## 2015-11-02 DIAGNOSIS — J9601 Acute respiratory failure with hypoxia: Secondary | ICD-10-CM | POA: Diagnosis present

## 2015-11-02 DIAGNOSIS — N400 Enlarged prostate without lower urinary tract symptoms: Secondary | ICD-10-CM | POA: Diagnosis not present

## 2015-11-02 DIAGNOSIS — J90 Pleural effusion, not elsewhere classified: Secondary | ICD-10-CM | POA: Diagnosis not present

## 2015-11-02 DIAGNOSIS — J9602 Acute respiratory failure with hypercapnia: Secondary | ICD-10-CM

## 2015-11-02 LAB — CBC WITH DIFFERENTIAL/PLATELET
BASOS ABS: 0 10*3/uL (ref 0.0–0.1)
BASOS PCT: 0 %
Eosinophils Absolute: 0.1 10*3/uL (ref 0.0–0.7)
Eosinophils Relative: 1 %
HEMATOCRIT: 36.1 % — AB (ref 39.0–52.0)
HEMOGLOBIN: 11.4 g/dL — AB (ref 13.0–17.0)
LYMPHS PCT: 6 %
Lymphs Abs: 0.5 10*3/uL — ABNORMAL LOW (ref 0.7–4.0)
MCH: 30.4 pg (ref 26.0–34.0)
MCHC: 31.6 g/dL (ref 30.0–36.0)
MCV: 96.3 fL (ref 78.0–100.0)
Monocytes Absolute: 0.6 10*3/uL (ref 0.1–1.0)
Monocytes Relative: 6 %
NEUTROS ABS: 7.7 10*3/uL (ref 1.7–7.7)
NEUTROS PCT: 87 %
Platelets: 165 10*3/uL (ref 150–400)
RBC: 3.75 MIL/uL — AB (ref 4.22–5.81)
RDW: 13.6 % (ref 11.5–15.5)
WBC: 8.9 10*3/uL (ref 4.0–10.5)

## 2015-11-02 LAB — RAPID URINE DRUG SCREEN, HOSP PERFORMED
AMPHETAMINES: NOT DETECTED
Barbiturates: NOT DETECTED
Benzodiazepines: POSITIVE — AB
COCAINE: NOT DETECTED
OPIATES: POSITIVE — AB
TETRAHYDROCANNABINOL: NOT DETECTED

## 2015-11-02 LAB — URINALYSIS, ROUTINE W REFLEX MICROSCOPIC
BILIRUBIN URINE: NEGATIVE
Glucose, UA: NEGATIVE mg/dL
Hgb urine dipstick: NEGATIVE
Ketones, ur: NEGATIVE mg/dL
LEUKOCYTES UA: NEGATIVE
NITRITE: NEGATIVE
Protein, ur: NEGATIVE mg/dL
SPECIFIC GRAVITY, URINE: 1.025 (ref 1.005–1.030)
pH: 5 (ref 5.0–8.0)

## 2015-11-02 LAB — COMPREHENSIVE METABOLIC PANEL
ALT: 18 U/L (ref 17–63)
ANION GAP: 8 (ref 5–15)
AST: 22 U/L (ref 15–41)
Albumin: 4 g/dL (ref 3.5–5.0)
Alkaline Phosphatase: 62 U/L (ref 38–126)
BUN: 24 mg/dL — ABNORMAL HIGH (ref 6–20)
CHLORIDE: 103 mmol/L (ref 101–111)
CO2: 26 mmol/L (ref 22–32)
CREATININE: 1.37 mg/dL — AB (ref 0.61–1.24)
Calcium: 9 mg/dL (ref 8.9–10.3)
GFR, EST AFRICAN AMERICAN: 56 mL/min — AB (ref >60)
GFR, EST NON AFRICAN AMERICAN: 48 mL/min — AB (ref >60)
Glucose, Bld: 110 mg/dL — ABNORMAL HIGH (ref 65–99)
POTASSIUM: 4.6 mmol/L (ref 3.5–5.1)
SODIUM: 137 mmol/L (ref 135–145)
Total Bilirubin: 0.6 mg/dL (ref 0.3–1.2)
Total Protein: 6.5 g/dL (ref 6.5–8.1)

## 2015-11-02 LAB — I-STAT CHEM 8, ED
BUN: 27 mg/dL — AB (ref 6–20)
Calcium, Ion: 1.11 mmol/L — ABNORMAL LOW (ref 1.13–1.30)
Chloride: 101 mmol/L (ref 101–111)
Creatinine, Ser: 1.3 mg/dL — ABNORMAL HIGH (ref 0.61–1.24)
Glucose, Bld: 112 mg/dL — ABNORMAL HIGH (ref 65–99)
HEMATOCRIT: 38 % — AB (ref 39.0–52.0)
HEMOGLOBIN: 12.9 g/dL — AB (ref 13.0–17.0)
POTASSIUM: 4.4 mmol/L (ref 3.5–5.1)
SODIUM: 138 mmol/L (ref 135–145)
TCO2: 29 mmol/L (ref 0–100)

## 2015-11-02 LAB — I-STAT CG4 LACTIC ACID, ED: LACTIC ACID, VENOUS: 2.2 mmol/L — AB (ref 0.5–2.0)

## 2015-11-02 LAB — TROPONIN I

## 2015-11-02 LAB — PROCALCITONIN: Procalcitonin: <0.1 ng/mL

## 2015-11-02 LAB — TSH: TSH: 0.411 u[IU]/mL (ref 0.350–4.500)

## 2015-11-02 LAB — CG4 I-STAT (LACTIC ACID): LACTIC ACID, VENOUS: 1.09 mmol/L (ref 0.5–2.0)

## 2015-11-02 LAB — BRAIN NATRIURETIC PEPTIDE: B Natriuretic Peptide: 94.2 pg/mL (ref 0.0–100.0)

## 2015-11-02 LAB — CK: Total CK: 30 U/L — ABNORMAL LOW (ref 49–397)

## 2015-11-02 MED ORDER — DICLOFENAC SODIUM 1 % TD GEL
2.0000 g | Freq: Four times a day (QID) | TRANSDERMAL | Status: DC
  Administered 2015-11-02 – 2015-11-03 (×3): 2 g via TOPICAL
  Filled 2015-11-02: qty 100

## 2015-11-02 MED ORDER — DULOXETINE HCL 60 MG PO CPEP
60.0000 mg | ORAL_CAPSULE | Freq: Every day | ORAL | Status: DC
  Administered 2015-11-02 – 2015-11-03 (×2): 60 mg via ORAL
  Filled 2015-11-02 (×2): qty 1

## 2015-11-02 MED ORDER — VANCOMYCIN HCL IN DEXTROSE 1-5 GM/200ML-% IV SOLN
1000.0000 mg | Freq: Once | INTRAVENOUS | Status: AC
  Administered 2015-11-02: 1000 mg via INTRAVENOUS
  Filled 2015-11-02: qty 200

## 2015-11-02 MED ORDER — POLYETHYLENE GLYCOL 3350 17 G PO PACK
17.0000 g | PACK | Freq: Every day | ORAL | Status: DC
  Administered 2015-11-02 – 2015-11-03 (×2): 17 g via ORAL
  Filled 2015-11-02 (×2): qty 1

## 2015-11-02 MED ORDER — SODIUM CHLORIDE 0.9 % IV BOLUS (SEPSIS)
1000.0000 mL | Freq: Once | INTRAVENOUS | Status: AC
  Administered 2015-11-02: 1000 mL via INTRAVENOUS

## 2015-11-02 MED ORDER — SODIUM CHLORIDE 0.9 % IV BOLUS (SEPSIS)
2000.0000 mL | Freq: Once | INTRAVENOUS | Status: AC
  Administered 2015-11-02: 2000 mL via INTRAVENOUS

## 2015-11-02 MED ORDER — AZITHROMYCIN 500 MG PO TABS: 250.0000 mg | ORAL_TABLET | Freq: Every day | ORAL | Status: DC

## 2015-11-02 MED ORDER — TAMSULOSIN HCL 0.4 MG PO CAPS
0.4000 mg | ORAL_CAPSULE | Freq: Every day | ORAL | Status: DC
  Administered 2015-11-02 – 2015-11-03 (×2): 0.4 mg via ORAL
  Filled 2015-11-02 (×2): qty 1

## 2015-11-02 MED ORDER — AZITHROMYCIN 500 MG PO TABS
500.0000 mg | ORAL_TABLET | Freq: Every day | ORAL | Status: AC
  Administered 2015-11-02: 500 mg via ORAL
  Filled 2015-11-02: qty 1

## 2015-11-02 MED ORDER — AMLODIPINE BESYLATE 5 MG PO TABS
5.0000 mg | ORAL_TABLET | Freq: Every day | ORAL | Status: DC
  Administered 2015-11-02 – 2015-11-03 (×2): 5 mg via ORAL
  Filled 2015-11-02 (×2): qty 1

## 2015-11-02 MED ORDER — ACETAMINOPHEN 650 MG RE SUPP
650.0000 mg | Freq: Once | RECTAL | Status: AC
  Administered 2015-11-02: 650 mg via RECTAL
  Filled 2015-11-02: qty 1

## 2015-11-02 MED ORDER — DEXTROSE 5 % IV SOLN
2.0000 g | INTRAVENOUS | Status: DC
  Administered 2015-11-02: 2 g via INTRAVENOUS
  Filled 2015-11-02 (×2): qty 2

## 2015-11-02 MED ORDER — OXYCODONE HCL 5 MG PO TABS
10.0000 mg | ORAL_TABLET | ORAL | Status: DC | PRN
  Administered 2015-11-02 – 2015-11-03 (×6): 10 mg via ORAL
  Filled 2015-11-02 (×6): qty 2

## 2015-11-02 MED ORDER — ASPIRIN EC 81 MG PO TBEC
81.0000 mg | DELAYED_RELEASE_TABLET | Freq: Every day | ORAL | Status: DC
  Administered 2015-11-02 – 2015-11-03 (×2): 81 mg via ORAL
  Filled 2015-11-02 (×2): qty 1

## 2015-11-02 MED ORDER — PIPERACILLIN-TAZOBACTAM 3.375 G IVPB
3.3750 g | Freq: Once | INTRAVENOUS | Status: AC
Start: 2015-11-02 — End: 2015-11-02
  Administered 2015-11-02: 3.375 g via INTRAVENOUS
  Filled 2015-11-02: qty 50

## 2015-11-02 MED ORDER — PIPERACILLIN-TAZOBACTAM 3.375 G IVPB
3.3750 g | Freq: Three times a day (TID) | INTRAVENOUS | Status: DC
  Filled 2015-11-02 (×3): qty 50

## 2015-11-02 MED ORDER — ENOXAPARIN SODIUM 40 MG/0.4ML ~~LOC~~ SOLN
40.0000 mg | SUBCUTANEOUS | Status: DC
  Administered 2015-11-02: 40 mg via SUBCUTANEOUS
  Filled 2015-11-02: qty 0.4

## 2015-11-02 MED ORDER — VANCOMYCIN HCL IN DEXTROSE 750-5 MG/150ML-% IV SOLN
750.0000 mg | Freq: Two times a day (BID) | INTRAVENOUS | Status: DC
  Filled 2015-11-02: qty 150

## 2015-11-02 NOTE — H&P (Signed)
Date: 11/02/2015               Patient Name:  Ronald [name] MRN: [id number]  DOB: 1940/07/26 Age / Sex: 75 y.o., male   PCP: No primary care provider on file.         Medical Service: Internal Medicine Teaching Service         Attending Physician: Dr. Axel Filler, MD    First Contact: MS4 Darryl Cunninham Pager: [phone]  Second Contact: Dr. Julious Oka Pager: [phone]       After Hours (After 5p/  First Contact Pager: [phone]  weekends / holidays): Second Contact Pager: [phone]   Chief Complaint: AMS  History of Present Illness: 16M with past medical history of HTN, BPH, chronic back pain, and anxiety who presented with AMS. Per patient, yesterday he was in his usual state of health. He woke up to use the bathroom and had to call his wife to help him walk back to his bed due to pain in b/l anterior thighs. He was able to get back to his bed with assistance, he then took an additional 1.5 tab of oxycodone 15mg  for pain. This morning when his wife tried to wake him up for breakfast she noticed that he was blue and was difficult to arouse. She noted that he had gurgling breath sounds and when she slapped him to wake up him his eyes rolled back. She called EMS and started chest compressions.  On the son's arrival to their house he noted that the patient was shaking all over and was confused. His wife notes that he had urinary incontinence this morning during episode of AMS. Pt does not remember anything after going to sleep last night after taking his extra dose of pain meds. He does not remember being in the emergency department. He has had a productive cough x 3-4 days with pink sputum. Denies fevers, chills, weight loss, sick contacts, nausea, vomiting, and diarrhea. He was recently started on ketoconazole for tinea pedis. He follows with [facility] for chronic back pain and takes oxycodone 15mg  q4h, he denies taking anything else for pain. He also takes 2.5mg  valium qhs.  He reports chest pain on b/l sides of his sternum, he states this has been going on for a year and is not associated with exertion. He will taking asa which takes the chest pain away. Chest pain is dull and starts at his back and radiates to his jaw and chest. He had a stress test in 2014 that was low risk.   In the ED he had a temperature of 103F, CXR was positive for RLL infiltrate, lactic acid was 2.20 and BNP 94.2. He was started on vanc/zosyn, and given 3L NS bolus.   Meds: Current Facility-Administered Medications  Medication Dose Route Frequency Provider Last Rate Last Dose  . amLODipine (NORVASC) tablet 5 mg  5 mg Oral Daily Norman Herrlich, MD   5 mg at 11/02/15 1628  . aspirin EC tablet 81 mg  81 mg Oral Daily Norman Herrlich, MD   81 mg at 11/02/15 1628  . azithromycin (ZITHROMAX) tablet 500 mg  500 mg Oral Daily Norman Herrlich, MD       Followed by  . [START ON 11/03/2015] azithromycin (ZITHROMAX) tablet 250 mg  250 mg Oral q1800 Norman Herrlich, MD      . cefTRIAXone (ROCEPHIN) 2 g in dextrose 5 % 50 mL IVPB  2 g Intravenous Q24H  Leodis Sias, RPH      . diclofenac sodium (VOLTAREN) 1 % transdermal gel 2 g  2 g Topical QID Norman Herrlich, MD      . DULoxetine (CYMBALTA) DR capsule 60 mg  60 mg Oral Daily Norman Herrlich, MD      . enoxaparin (LOVENOX) injection 40 mg  40 mg Subcutaneous Q24H Norman Herrlich, MD   40 mg at 11/02/15 1628  . oxyCODONE (Oxy IR/ROXICODONE) immediate release tablet 10 mg  10 mg Oral Q4H PRN Norman Herrlich, MD   10 mg at 11/02/15 1451  . polyethylene glycol (MIRALAX / GLYCOLAX) packet 17 g  17 g Oral Daily Norman Herrlich, MD      . tamsulosin [facility]) capsule 0.4 mg  0.4 mg Oral Daily Norman Herrlich, MD   0.4 mg at 11/02/15 1628    Allergies: Allergies as of 11/02/2015 - Review Complete 11/02/2015  Allergen Reaction Noted  . Phenobarbital Rash 10/02/2011   Past Medical History  Diagnosis Date  . Hypertension   . BPH (benign prostatic hyperplasia)   .  Chronic pain syndrome   . Lumbar radicular pain   . Anxiety    Past Surgical History  Procedure Laterality Date  . Back surgery    . Cervical fusion    . Thyroidectomy    . Lumbar nerve stimlator insertion    . Colonoscopy      reported as normal   Family History  Problem Relation Age of Onset  . Coronary artery disease Father 52    CABG   Social History   Social History  . Marital Status: Married    Spouse Name: N/A  . Number of Children: N/A  . Years of Education: N/A   Occupational History  . Not on file.   Social History Main Topics  . Smoking status: Never Smoker   . Smokeless tobacco: Never Used  . Alcohol Use: No  . Drug Use: No  . Sexual Activity: Yes    Birth Control/ Protection: None   Other Topics Concern  . Not on file   Social History Narrative   [name] at [employer] - lives in [place]   Lives with Wife and     Review of Systems: Review of Systems  Constitutional: Negative for fever, chills and diaphoresis.  HENT: Negative for sore throat (feels like he has a pill stuck on the right side of his throat).   Respiratory: Positive for cough and sputum production. Negative for wheezing.   Cardiovascular: Positive for chest pain and leg swelling. Negative for palpitations and orthopnea.  Gastrointestinal: Positive for constipation (due to narcotics). Negative for nausea, vomiting, abdominal pain and diarrhea.  Genitourinary: Negative for dysuria and urgency.  Musculoskeletal: Positive for back pain and neck pain.  Skin: Negative for rash.  Neurological: Negative for sensory change, focal weakness and weakness.    Physical Exam: Blood pressure 139/54, pulse 78, temperature 98 F (36.7 C), temperature source Oral, resp. rate 14, height 5\' 6"  (1.676 m), weight 214 lb 8 oz (97.297 kg), SpO2 97 %. Physical Exam  Constitutional: He is oriented to person, place, and time. He appears well-developed and well-nourished. No distress.  HENT:  Head:  Normocephalic and atraumatic.  Nose: Nose normal.  Dry mucous membranes   Eyes: Conjunctivae and EOM are normal. No scleral icterus.  Neck: Neck supple. No thyromegaly present.  Cardiovascular: Normal rate, regular rhythm, normal heart sounds and intact distal pulses.  Exam reveals  no gallop and no friction rub.   No murmur heard. Pulmonary/Chest: Effort normal. No respiratory distress. He has no wheezes. He has rales (faint RLL crackles).  Abdominal: Soft. Bowel sounds are normal. He exhibits no distension. There is no tenderness. There is no rebound and no guarding.  Musculoskeletal: He exhibits edema (1+ pedal edema b/l).  Lymphadenopathy:    He has no cervical adenopathy.  Neurological: He is alert and oriented to person, place, and time.  Skin: Skin is warm and dry. No rash noted. He is not diaphoretic. No erythema. No pallor.  White, non odorous, fungal infection btwn left 5th and 4th foot digits.       Lab results: Basic Metabolic Panel:  Recent Labs  11/02/15 0950 11/02/15 1008  NA 137 138  K 4.6 4.4  CL 103 101  CO2 26  --   GLUCOSE 110* 112*  BUN 24* 27*  CREATININE 1.37* 1.30*  CALCIUM 9.0  --    Liver Function Tests:  Recent Labs  11/02/15 0950  AST 22  ALT 18  ALKPHOS 62  BILITOT 0.6  PROT 6.5  ALBUMIN 4.0   CBC:  Recent Labs  11/02/15 1008 11/02/15 1033  WBC  --  8.9  NEUTROABS  --  7.7  HGB 12.9* 11.4*  HCT 38.0* 36.1*  MCV  --  96.3  PLT  --  165   Urinalysis:  Recent Labs  11/02/15 1016  COLORURINE YELLOW  LABSPEC 1.025  PHURINE 5.0  GLUCOSEU NEGATIVE  HGBUR NEGATIVE  BILIRUBINUR NEGATIVE  KETONESUR NEGATIVE  PROTEINUR NEGATIVE  NITRITE NEGATIVE  LEUKOCYTESUR NEGATIVE    Imaging results:  Dg Chest Port 1 View  11/02/2015  CLINICAL DATA:  Weakness.  Cough. EXAM: PORTABLE CHEST 1 VIEW COMPARISON:  11/05/2012 . FINDINGS: Cardiomegaly. Mild pulmonary vascular prominence. Mild interstitial prominence. Mild congestive heart  failure cannot be excluded. Mild right lower lobe atelectasis and or infiltrate. Small right pleural effusion. No pneumothorax IMPRESSION: 1. Cardiomegaly with mild pulmonary vascular prominence. Mild interstitial prominence. Small right pleural effusion . Mild congestive heart failure cannot be excluded . 2.  Mild right lower lobe atelectasis and/or infiltrate. Electronically Signed   By: Marcello Moores  Register   On: 11/02/2015 10:29    Other results: EKG Interpretation  Date/Time:  Tuesday November 02 2015 10:03:40 EDT Ventricular Rate:  86 PR Interval:    QRS Duration: 91 QT Interval:  346 QTC Calculation: 414 R Axis:   36 Text Interpretation:  Sinus rhythm Confirmed by ZAMMIT  MD, JOSEPH [phone]) on 11/02/2015 11:53:00 AM  Assessment & Plan by Problem: Principal Problem:   Pneumonia Active Problems:   HTN (hypertension)   Altered mental state   Chronic pain syndrome   Polypharmacy  Altered mental status-- pt is on a high dose opioid regimen of 15mg  q4h scheduled along with valium 2.25 qhs and gabapentin 300mg  TID. He also is obese with a lg neck circumference with likely underlying sleep apnea which would make him at increased risk for hypercarbic respiratory failure. He was recently started on ketoconazole which he last took yesterday which increases the concentration of oxycodone. Likely his AMS this morning was due to all of those above factors; acute hypercarbic respiratory failure in the setting of increased opioid use and recent ketoconazole use which further increased serum opioid concentrations. It could also be due to a seizure as he had confusion and urine incontinence as well as all over body shaking. UDS + for benzos and opiates as would  be expected. UA neg for UTI. CXR positive for RLL infiltrate and pt was febrile on admission which could be the result of aspiration pneumonia from his AMS episode. TSH wnl. EEG neg for epileptiform activity, CT head negative.  - admit to tele - stop  ketoconazole, reduce dose of oxycodone to 10mg  and observe until ketoconazole washes out of system, it has a biphasic half life of elimination with initial of 2 hours and terminal of 8 hours.  - hold other sedating meds - outpatient sleep study - serum troponin lab ordered which came back negative.  - follow blood and urine culture  Aspiration pna vs CAP-- CXR positive for RLL infiltrate and pt was febrile on admission which could be the result of aspiration pneumonia from his AMS vs community acquired pna as he has had increased cough w/ sputum production the past few days. Denies fevers at home. He was given dose of vanc and zosyn in the ED. Procalcitonin  neg - started on azithromycin and rocephin for CAP - CBC in the morning - 2 view CXR  HTN- holding home cozaar, can resume if BP elevated.  - continue home norvasc 5mg   Chronic back pain - continue oxycodone but at reduced dose of 10mg  q4h scheduled.  Anxiety-- holding home valium, continue cymbalta  DVT ppx-- lovenox  Dispo: Disposition is deferred at this time, awaiting improvement of current medical problems.   The patient does have a current PCP (No primary care provider on file.) and does not need an [facility] hospital follow-up appointment after discharge.  The patient does not have transportation limitations that hinder transportation to clinic appointments.  Signed: Norman Herrlich, MD 11/02/2015, 2:15 PM

## 2015-11-02 NOTE — ED Notes (Signed)
Doctor at bedside.

## 2015-11-02 NOTE — ED Notes (Signed)
Arrived via EMS patient AMS per wife patient woke up 0630 in bathroom for 10 minutes saw patient shaking helped patient to bed. Patient took 1.5 tabs of oxycodone 0715 and went to sleep. Patient tried to wake patient up at 0830 could shouting slapped patient turned blue called EMS brought patient to floor started compressions. When he started moaning patient stopped. EMS states patient continued to shake jerky movements. Pulse oximetry 88-92% CBG 141 and oral temp 101.20F Alert answering and following commands appropriate.

## 2015-11-02 NOTE — Progress Notes (Signed)
EEG Completed; Results Pending  

## 2015-11-02 NOTE — ED Notes (Signed)
Spoke with EDP asked about antibiotics.

## 2015-11-02 NOTE — ED Notes (Signed)
Patient difficult to obtain IV and blood draw. Have one blood culture prior to starting antibiotics.

## 2015-11-02 NOTE — ED Notes (Signed)
IV team at bedside 

## 2015-11-02 NOTE — H&P (Signed)
Date: 11/02/2015               Patient Name:  Ronald [name] MRN: [id number]  DOB: 28-Aug-1940 Age / Sex: 75 y.o., male   PCP: No primary care provider on file.              Medical Service: Internal Medicine Teaching Service              Attending Physician: Dr. Axel Filler, MD    First Contact: Garald Braver, [place] [facility] Pager: [phone]  Second Contact: Dr. Julious Oka Pager: [phone]       After Hours (After 5p/  First Contact Pager: [phone]  weekends / holidays): Second Contact Pager: [phone]   Chief Complaint: Altered Mental Status  History of Present Illness: Ronald [name] is a 75 y.o. male with a history of HTN, BPH and chronic paint presenting with altered mental status. Patient's wife at bedside who assisted with HPI.   Woke up this morning and went to the bathroom and felt pain in his thighs and felt like he couldn't move. Wife helped him to the bed and took 1.5 tablets of oxycodone 15mg  help his pain.  About 2 hours later she noticed that patient looked blue and "gurgling his words" so she called EMS and subsequently started compressions. Wife reports shaking movements of upper and lower extremities throughout this entire event. Urinary incontinence also noted. Patient does not remember much about the details of the morning and was confused afterwards. Patient denies associated chest pain, palpitations, headache, acute neck stiffness, diaphoresis, and chills.  Within the past week patient has had congestion, sore throat and intermittent productive cough (pink, thick sputum).  No history of diabetes, seizures or heart abnormalities. Denies sick contacts. Started Ketoconazole 200mg  last week after diagnosed with fungal infection of right toe.  Took last dose yesterday. Takes Oxycodone 15mg  q4h for chronic pain due to multiple back surgeries (managed by [facility]). Also had a spinal cord stimulator placed in 2009 for intractable back pain. Lives at home with wife and  denies smoking, alcohol use or ilicit drug use.   Pulse oximetry 88-92% ORA in EMS but improved to 97-100 O2 sat with 2L NS in ED.  Tmax was 103. CXR findings concerning for right lower lobe pneumonia   Review of Systems: General: negative for weight loss, night sweats, chills. Positive for fever HENT: Positive for congestion, sore throat Cardiovascular: negative for chest pain, palpitations, orthopnea Gastrointestinal: negative for diarrhea, positive for opioid induced constipation MSK: Positive for bilateral lower extremity pain Skin: negative for rash Neurological: negative for seizures and headaches   Meds: Current Facility-Administered Medications  Medication Dose Route Frequency Provider Last Rate Last Dose  . amLODipine (NORVASC) tablet 5 mg  5 mg Oral Daily Norman Herrlich, MD   5 mg at 11/02/15 1628  . aspirin EC tablet 81 mg  81 mg Oral Daily Norman Herrlich, MD   81 mg at 11/02/15 1628  . [START ON 11/03/2015] azithromycin (ZITHROMAX) tablet 250 mg  250 mg Oral q1800 Norman Herrlich, MD      . cefTRIAXone (ROCEPHIN) 2 g in dextrose 5 % 50 mL IVPB  2 g Intravenous Q24H Leodis Sias, RPH   2 g at 11/02/15 1730  . diclofenac sodium (VOLTAREN) 1 % transdermal gel 2 g  2 g Topical QID Norman Herrlich, MD   2 g at 11/02/15 1730  . DULoxetine (CYMBALTA) DR capsule 60 mg  60 mg Oral Daily Norman Herrlich, MD   60 mg at 11/02/15 1731  . enoxaparin (LOVENOX) injection 40 mg  40 mg Subcutaneous Q24H Norman Herrlich, MD   40 mg at 11/02/15 1628  . oxyCODONE (Oxy IR/ROXICODONE) immediate release tablet 10 mg  10 mg Oral Q4H PRN Norman Herrlich, MD   10 mg at 11/02/15 1451  . polyethylene glycol (MIRALAX / GLYCOLAX) packet 17 g  17 g Oral Daily Norman Herrlich, MD   17 g at 11/02/15 1730  . tamsulosin (FLOMAX) capsule 0.4 mg  0.4 mg Oral Daily Norman Herrlich, MD   0.4 mg at 11/02/15 1628    Allergies: Allergies as of 11/02/2015 - Review Complete 11/02/2015  Allergen Reaction Noted  . Phenobarbital  Rash 10/02/2011   Past Medical History  Diagnosis Date  . Hypertension   . BPH (benign prostatic hyperplasia)   . Chronic pain syndrome   . Lumbar radicular pain   . Anxiety    Past Surgical History  Procedure Laterality Date  . Back surgery    . Cervical fusion    . Thyroidectomy    . Lumbar nerve stimlator insertion    . Colonoscopy      reported as normal   Family History  Problem Relation Age of Onset  . Coronary artery disease Father 58    CABG   Social History   Social History  . Marital Status: Married    Spouse Name: N/A  . Number of Children: N/A  . Years of Education: N/A   Occupational History  . [employer] for over 40 years   Social History Main Topics  . Smoking status: Never Smoker   . Smokeless tobacco: Never Used  . Alcohol Use: No  . Drug Use: No  . Sexual Activity: Yes    Birth Control/ Protection: None   Other Topics Concern  . Not on file   Social History Narrative   [name] at [employer] - lives in [place]   Lives with Wife and       Physical Exam: Blood pressure 139/54, pulse 78, temperature 98 F (36.7 C), temperature source Oral, resp. rate 14, height 5\' 6"  (1.676 m), weight 97.297 kg (214 lb 8 oz), SpO2 97 %. BP 139/54 mmHg  Pulse 85  Temp(Src) 100.9 F (38.3 C) (Oral)  Resp 16  Ht 5\' 6"  (1.676 m)  Wt 97.297 kg (214 lb 8 oz)  BMI 34.64 kg/m2  SpO2 97%  General Appearance:    Alert, cooperative, no distress, nasal canula in place  HEENT:    PERRL, no conjunctival pallor, crusting around eyes,  Dry pharyngeal mucosa  Neck:   No cervical lymphadenopathy  Lungs:     Clear to auscultation bilaterally, respirations unlabored  Heart:    Regular rate and rhythm, S1 and S2 normal, no murmur, rub   or gallop  Abdomen:     Soft, non-tender, bowel sounds active   Extremities:   1+ peripheral edema  Pulses:   2+ and symmetric all extremities  Neurologic:  Oriented to person, place and president    Lab  results: Basic Metabolic Panel:  Recent Labs  11/02/15 0950 11/02/15 1008  NA 137 138  K 4.6 4.4  CL 103 101  CO2 26  --   GLUCOSE 110* 112*  BUN 24* 27*  CREATININE 1.37* 1.30*  CALCIUM 9.0  --    Liver Function Tests:  Recent Labs  11/02/15 0950  AST 22  ALT 18  ALKPHOS 62  BILITOT 0.6  PROT 6.5  ALBUMIN 4.0   CBC:  Recent Labs  11/02/15 1008 11/02/15 1033  WBC  --  8.9  NEUTROABS  --  7.7  HGB 12.9* 11.4*  HCT 38.0* 36.1*  MCV  --  96.3  PLT  --  165   Urinalysis:  Recent Labs  11/02/15 1016  COLORURINE YELLOW  LABSPEC 1.025  PHURINE 5.0  GLUCOSEU NEGATIVE  HGBUR NEGATIVE  BILIRUBINUR NEGATIVE  KETONESUR NEGATIVE  PROTEINUR NEGATIVE  NITRITE NEGATIVE  LEUKOCYTESUR NEGATIVE   Imaging results:  Ct Head Wo Contrast  11/02/2015  CLINICAL DATA:  Seizure EXAM: CT HEAD WITHOUT CONTRAST TECHNIQUE: Contiguous axial images were obtained from the base of the skull through the vertex without intravenous contrast. COMPARISON:  CT 03/27/2006 FINDINGS: Ventricle size normal.  Cerebral volume normal for age Well-defined hypodensity in the right frontal periventricular white matter and basal ganglia consistent with chronic infarction. This was not present previously. Progression of chronic microvascular ischemic changes in the left frontal white matter also noted. Negative for acute infarct.  Negative for intracranial hemorrhage. Left middle cranial fossa arachnoid cyst measuring 32 x 37 mm is unchanged from prior studies No lesion of the calvarium. IMPRESSION: Chronic microvascular ischemic changes have progressed since 2007. No acute abnormality. 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   On: 11/02/2015 16:25   Dg Chest Port 1 View  11/02/2015  CLINICAL DATA:  Weakness.  Cough. EXAM: PORTABLE CHEST 1 VIEW COMPARISON:  11/05/2012 . FINDINGS: Cardiomegaly. Mild pulmonary vascular prominence. Mild interstitial prominence.  Mild congestive heart failure cannot be excluded. Mild right lower lobe atelectasis and or infiltrate. Small right pleural effusion. No pneumothorax IMPRESSION: 1. Cardiomegaly with mild pulmonary vascular prominence. Mild interstitial prominence. Small right pleural effusion . Mild congestive heart failure cannot be excluded . 2.  Mild right lower lobe atelectasis and/or infiltrate. Electronically Signed   By: Marcello Moores  Register   On: 11/02/2015 10:29   Other results: EKG: normal EKG, normal sinus rhythm, unchanged from previous tracings  EEG:  This is an abnormal EEG demonstrating a mild diffuse slowing of electrocerebral activity. This can be seen in a wide variety of encephalopathic state including those of a toxic, metabolic, or degenerative nature. There were no focal, hemispheric, or lateralizing features. No epileptiform activity was recorded.  Problem List: Principal Problem:   Pneumonia Active Problems:   HTN (hypertension)   Altered mental state   Chronic pain syndrome   Polypharmacy  Assessment & Plan: Ronald [name] is a 75 y.o. male with a history of HTN, BPH and chronic paint presenting with altered mental status and found to have CXR findings concerning for pneuomonia  Altered Mental Status:  Polypharmacy vs. Seizure vs. OSA vs. Syncope vs. Infection Patient recently started taking Ketoconazole which influences the metabolism of oxycodone.  This interaction may have increased the oxycodone levels which may have contributed to his presentation. Seizure less likely as CK low (30), CT negative for an acute structural brain abnormality that could act as a nidus for seizure activity and EEG negative for epileptiform activity.  Hypothyroidism is usually associated with seizure activity but TSH is normal. Cardiogenic cause of syncope is less likely as EKG was negative for arrhythmia, BNP was normal (94.3) and Troponin I was negative (< 0.03). BMP was normal making dehydration less likely. UA  negative for UTI. Lactic  acid -- oxycodone downitirated to 10mg  q4h -- orthostatic vitals -- Blood and Urine culture pending  Pneumonia:  Fever of 103 on admission, initial poor O2 saturation, new productive cough and CXR showing right pleural effusion concerning for pneumonia.  Differential includes aspiration pneumonia (due to location of effusion) vs. CAP.  CURB-65 Score of 2.   S. pneumo and H. Flu are the most common bacteria involved in this population with CAP.  Initially started on Vancomycin and Zosyn in the ED but will switch to Ceftriaxone and Azithromycin for better coverage of these organisms.  Procalcitonin levels will be drawn to determine whether antibiotics are needed.  Procalcitonin is usually downregulated in viral infections and upregulated in bacterial infections. -- r/p CBC -- Procalcitonin -- d/c IV Vancomycin and Zosyn -- Start IV ceftriaxone and PO Azithromycin  Chronic Pain:  On oxycodone 15mg  at home for chronic back pain.  Home dose of Oxycodone was downtitrated 10mg  (as explained above) -- oxycodone 10mg  q4h -- Voltaren gel 2g QID -- Miralax for opioid induced constipation ppx  Hypertension -- resume home Norvasc 5mg  daily  BPH -- resume home Tamsulosin 0.4mg  daily  PPX -- DVT: Lovenox  Diet: Regular Code Status: FULL  Dispo: pending medical improvement   This is a Careers information officer Note.  The care of the patient was discussed with Dr.Truong and the assessment and plan was formulated with their assistance.  Please see their note for official documentation of the patient encounter.   Signed: Garald Braver, Med Student 11/02/2015, 2:13 PM

## 2015-11-02 NOTE — Procedures (Signed)
HPI:  75 y/o with MS change and witnessed convulsive activity at home  TECHNICAL SUMMARY:  A multichannel referential and bipolar montage EEG using the standard international 10-20 system was performed on the patient described as very drowsy.  The dominant background activity consists of 7.5 hertz activity seen most prominantly over the posterior head region.  The backgound activity is reactive to eye opening and closing procedures.  Low voltage fast (beta) activity is distributed symmetrically and maximally over the anterior head regions.  ACTIVATION:  Stepwise photic stimulation and hyperventilation are not performed.  EPILEPTIFORM ACTIVITY:  There were no spikes, sharp waves or paroxysmal activity.  SLEEP:  Stage I sleep architecture was noted  CARDIAC:  The EKG lead was not well recorded.  IMPRESSION:  This is an abnormal EEG demonstrating a mild diffuse slowing of electrocerebral activity.  This can be seen in a wide variety of encephalopathic state including those of a toxic, metabolic, or degenerative nature.  There were no focal, hemispheric, or lateralizing features.  No epileptiform activity was recorded.

## 2015-11-02 NOTE — Progress Notes (Signed)
Pharmacy Antibiotic Note  Ronald [name] is a 75 y.o. male admitted on 11/02/2015 with pneumonia.  Pharmacy has been consulted for Zosyn and vancomycin dosing.  Day #1 of possible HAP. Tmax of 103 today, WBC wnl. Received 1 time doses of vanc and Zosyn in the ED. SCr 1.3, CrCl ~18ml/min.  Plan: Start Zosyn 3.375 gm IV q8h (4 hour infusion) Start vancomycin 750mg  IV Q12 Monitor clinical picture, renal function, VT prn F/U C&S, abx deescalation / LOT    Height: 5\' 6"  (167.6 cm) Weight: 200 lb (90.719 kg) (Wife ) IBW/kg (Calculated) : 63.8  Temp (24hrs), Avg:101.6 F (38.7 C), Min:100.6 F (38.1 C), Max:103 F (39.4 C)   Recent Labs Lab 11/02/15 0950 11/02/15 1008 11/02/15 1033  WBC  --   --  8.9  CREATININE 1.37* 1.30*  --   LATICACIDVEN  --  2.20*  --     Estimated Creatinine Clearance: 50.2 mL/min (by C-G formula based on Cr of 1.3).    Allergies  Allergen Reactions  . Phenobarbital Rash    Antimicrobials this admission: Zosyn 6/20 >>  Vancomycin 6/20 >>   Dose adjustments this admission: n/a  Microbiology results: 6/20 BCx: sent 6/20 UCx: sent   Thank you for allowing pharmacy to be a part of this patient's care.  Reginia Naas 11/02/2015 12:46 PM

## 2015-11-02 NOTE — ED Provider Notes (Signed)
CSN: [id number]     Arrival date & time 11/02/15  [id number] History   First MD Initiated Contact with Patient 11/02/15 [phone]     Chief Complaint  Patient presents with  . Altered Mental Status     (Consider location/radiation/quality/duration/timing/severity/associated sxs/prior Treatment) Patient is a 75 y.o. male presenting with altered mental status. The history is provided by the spouse and the patient (Patient was shaking this morning in the bathroom around 6:30. He took some with pain medicine and went to bed. His wife tried to wake him at 73 was looking blue she stated that she did CPR on him. When paramedics arrived he was starting to awake).  Altered Mental Status Presenting symptoms: confusion   Severity:  Mild Timing:  Rare Progression:  Improving Chronicity:  New Context: not alcohol use   Associated symptoms: fever   Associated symptoms: no hallucinations, no headaches, no rash and no seizures     Past Medical History  Diagnosis Date  . Hypertension   . BPH (benign prostatic hyperplasia)   . Chronic pain syndrome   . Lumbar radicular pain   . Anxiety    Past Surgical History  Procedure Laterality Date  . Back surgery    . Cervical fusion    . Thyroidectomy    . Lumbar nerve stimlator insertion    . Colonoscopy      reported as normal   Family History  Problem Relation Age of Onset  . Coronary artery disease Father 42    CABG   Social History  Substance Use Topics  . Smoking status: Never Smoker   . Smokeless tobacco: Never Used  . Alcohol Use: No    Review of Systems  Constitutional: Positive for fever. Negative for appetite change and fatigue.  HENT: Negative for congestion, ear discharge and sinus pressure.   Eyes: Negative for discharge.  Respiratory: Positive for cough.   Cardiovascular: Negative for chest pain.  Gastrointestinal: Negative for diarrhea.  Genitourinary: Negative for frequency and hematuria.  Musculoskeletal: Negative for back  pain.  Skin: Negative for rash.  Neurological: Negative for seizures and headaches.  Psychiatric/Behavioral: Positive for confusion. Negative for hallucinations.      Allergies  Phenobarbital  Home Medications   Prior to Admission medications   Medication Sig Start Date End Date Taking? Authorizing Provider  amLODipine (NORVASC) 5 MG tablet Take 5 mg by mouth daily.    Historical Provider, MD  aspirin EC 81 MG EC tablet Take 1 tablet (81 mg total) by mouth daily. 11/07/12   Gerda Diss, DO  atorvastatin (LIPITOR) 40 MG tablet Take 1 tablet (40 mg total) by mouth daily at 6 PM. 11/07/12   Gerda Diss, DO  diazepam (VALIUM) 5 MG tablet Take 2.5 mg by mouth at bedtime as needed for anxiety.    Historical Provider, MD  DULoxetine (CYMBALTA) 60 MG capsule Take 60 mg by mouth daily.    Historical Provider, MD  losartan (COZAAR) 25 MG tablet Take 1 tablet (25 mg total) by mouth daily. 11/07/12   Gerda Diss, DO  oxyCODONE (ROXICODONE) 15 MG immediate release tablet Take 15 mg by mouth every 4 (four) hours as needed for pain. Up to 7 tablets in a day.    Historical Provider, MD  tamsulosin (FLOMAX) 0.4 MG CAPS Take 0.4 mg by mouth daily.    Historical Provider, MD   BP 141/67 mmHg  Pulse 86  Temp(Src) 100.8 F (38.2 C) (Core (Comment))  Resp 18  Ht 5\' 6"  (1.676 m)  Wt 200 lb (90.719 kg)  BMI 32.30 kg/m2  SpO2 96% Physical Exam  Constitutional: He is oriented to person, place, and time. He appears well-developed.  HENT:  Head: Normocephalic.  Eyes: Conjunctivae and EOM are normal. No scleral icterus.  Neck: Neck supple. No thyromegaly present.  Cardiovascular: Normal rate and regular rhythm.  Exam reveals no gallop and no friction rub.   No murmur heard. Pulmonary/Chest: No stridor. He has no wheezes. He has no rales. He exhibits no tenderness.  Abdominal: He exhibits no distension. There is no tenderness. There is no rebound.  Musculoskeletal: Normal range of motion. He  exhibits no edema.  Lymphadenopathy:    He has no cervical adenopathy.  Neurological: He is oriented to person, place, and time. He exhibits normal muscle tone. Coordination normal.  Skin: No rash noted. No erythema.  Psychiatric: He has a normal mood and affect. His behavior is normal.    ED Course  Procedures (including critical care time) Labs Review Labs Reviewed  COMPREHENSIVE METABOLIC PANEL - Abnormal; Notable for the following:    Glucose, Bld 110 (*)    BUN 24 (*)    Creatinine, Ser 1.37 (*)    GFR calc non Af Amer 48 (*)    GFR calc Af Amer 56 (*)    All other components within normal limits  CBC WITH DIFFERENTIAL/PLATELET - Abnormal; Notable for the following:    RBC 3.75 (*)    Hemoglobin 11.4 (*)    HCT 36.1 (*)    Lymphs Abs 0.5 (*)    All other components within normal limits  I-STAT CG4 LACTIC ACID, ED - Abnormal; Notable for the following:    Lactic Acid, Venous 2.20 (*)    All other components within normal limits  I-STAT CHEM 8, ED - Abnormal; Notable for the following:    BUN 27 (*)    Creatinine, Ser 1.30 (*)    Glucose, Bld 112 (*)    Calcium, Ion 1.11 (*)    Hemoglobin 12.9 (*)    HCT 38.0 (*)    All other components within normal limits  CULTURE, BLOOD (ROUTINE X 2)  CULTURE, BLOOD (ROUTINE X 2)  URINE CULTURE  URINALYSIS, ROUTINE W REFLEX MICROSCOPIC (NOT AT [facility])  BRAIN NATRIURETIC PEPTIDE  CBC WITH DIFFERENTIAL/PLATELET  I-STAT CG4 LACTIC ACID, ED    Imaging Review Dg Chest Port 1 View  11/02/2015  CLINICAL DATA:  Weakness.  Cough. EXAM: PORTABLE CHEST 1 VIEW COMPARISON:  11/05/2012 . FINDINGS: Cardiomegaly. Mild pulmonary vascular prominence. Mild interstitial prominence. Mild congestive heart failure cannot be excluded. Mild right lower lobe atelectasis and or infiltrate. Small right pleural effusion. No pneumothorax IMPRESSION: 1. Cardiomegaly with mild pulmonary vascular prominence. Mild interstitial prominence. Small right pleural  effusion . Mild congestive heart failure cannot be excluded . 2.  Mild right lower lobe atelectasis and/or infiltrate. Electronically Signed   By: Marcello Moores  Register   On: 11/02/2015 10:29   I have personally reviewed and evaluated these images and lab results as part of my medical decision-making.   EKG Interpretation   Date/Time:  Tuesday November 02 2015 10:03:40 EDT Ventricular Rate:  86 PR Interval:    QRS Duration: 91 QT Interval:  346 QTC Calculation: 414 R Axis:   36 Text Interpretation:  Sinus rhythm Confirmed by Walden Statz  MD, Arcadio Cope [phone])  on 11/02/2015 11:53:00 AM      MDM   Final diagnoses:  Community acquired pneumonia  Chest x-ray shows possible pneumonia. Patient with febrile illness and will be admitted and treated for pneumonia   Milton Ferguson, MD 11/02/15 1241

## 2015-11-02 NOTE — Care Management Obs Status (Signed)
[place] NOTIFICATION   Patient Details  Name: Ronald [name] MRN: [id number] Date of Birth: 1941-02-07   [facility]  Medicare Observation Status Notification Given:  Yes    Carles Collet, RN 11/02/2015, 3:31 PM

## 2015-11-02 NOTE — ED Notes (Signed)
Doctor ordered patient to receive 2000 ML ONLY not 3000 ML.

## 2015-11-02 NOTE — ED Notes (Signed)
Lab states CBC needs to be recollected rt clotting.

## 2015-11-02 NOTE — Progress Notes (Signed)
Ronald [name] [id number] Admission Data: 11/02/2015 1:32 PM Attending Provider: Axel Filler, MD  PCP:No primary care provider on file. Consults/ Treatment Team:    Ronald [name] is a 75 y.o. male patient admitted from ED awake, alert  & orientated  X 3,  Prior, VSS - Blood pressure 139/54, pulse 78, temperature 98 F (36.7 C), temperature source Oral, resp. rate 14, height 5\' 6"  (1.676 m), weight 97.297 kg (214 lb 8 oz), SpO2 97 %., O2    2  L nasal cannular, no c/o shortness of breath, no c/o chest pain, no distress noted. Tele # 17 placed .   IV site WDL: Right hand SL.   Allergies:   Allergies  Allergen Reactions  . Phenobarbital Rash     Past Medical History  Diagnosis Date  . Hypertension   . BPH (benign prostatic hyperplasia)   . Chronic pain syndrome   . Lumbar radicular pain   . Anxiety      Pt orientation to unit, room and routine. Information packet given to patient/family and safety video watched.  Admission INP armband ID verified with patient/family, and in place. SR up x 2, fall risk assessment complete with Patient and family verbalizing understanding of risks associated with falls. Pt verbalizes an understanding of how to use the call bell and to call for help before getting out of bed.  Skin, clean-dry- intact without evidence of bruising, or skin tears.   No evidence of skin break down noted on exam.    Will cont to monitor and assist as needed.  Dayle Points, RN 11/02/2015 1:32 PM

## 2015-11-02 NOTE — Progress Notes (Signed)
Pharmacy Antibiotic Note  Ronald [name] is a 75 y.o. male admitted on 11/02/2015 with pneumonia. Initially started vancomycin and zosyn, now switching to rocephin + azithro for CAP coverage.   Tmax of 103 today, WBC wnl.   Plan: Rocephin 2g IV Q 24 hrs Pharmacy sign off    Height: 5\' 6"  (167.6 cm) Weight: 214 lb 8 oz (97.297 kg) IBW/kg (Calculated) : 63.8  Temp (24hrs), Avg:101.2 F (38.4 C), Min:98 F (36.7 C), Max:103 F (39.4 C)   Recent Labs Lab 11/02/15 0950 11/02/15 1008 11/02/15 1033 11/02/15 1312  WBC  --   --  8.9  --   CREATININE 1.37* 1.30*  --   --   LATICACIDVEN  --  2.20*  --  1.09    Estimated Creatinine Clearance: 52 mL/min (by C-G formula based on Cr of 1.3).    Allergies  Allergen Reactions  . Phenobarbital Rash    Antimicrobials this admission: Zosyn 6/20 x 1 Vancomycin 6/20 x 1 azithr 6/20 >> Rocephin 620 >>  Dose adjustments this admission: n/a  Microbiology results: 6/20 BCx: sent 6/20 UCx: sent   Thank you for allowing pharmacy to be a part of this patient's care.  Maryanna Shape, PharmD, BCPS  Clinical Pharmacist  Pager: [phone]   11/02/2015 4:42 PM

## 2015-11-03 DIAGNOSIS — J189 Pneumonia, unspecified organism: Secondary | ICD-10-CM | POA: Diagnosis not present

## 2015-11-03 LAB — CBC
HEMATOCRIT: 33.5 % — AB (ref 39.0–52.0)
HEMOGLOBIN: 10.9 g/dL — AB (ref 13.0–17.0)
MCH: 31.1 pg (ref 26.0–34.0)
MCHC: 32.5 g/dL (ref 30.0–36.0)
MCV: 95.4 fL (ref 78.0–100.0)
Platelets: 169 10*3/uL (ref 150–400)
RBC: 3.51 MIL/uL — AB (ref 4.22–5.81)
RDW: 13.6 % (ref 11.5–15.5)
WBC: 6.3 10*3/uL (ref 4.0–10.5)

## 2015-11-03 LAB — BASIC METABOLIC PANEL
ANION GAP: 7 (ref 5–15)
BUN: 16 mg/dL (ref 6–20)
CALCIUM: 8.3 mg/dL — AB (ref 8.9–10.3)
CHLORIDE: 102 mmol/L (ref 101–111)
CO2: 26 mmol/L (ref 22–32)
Creatinine, Ser: 1.12 mg/dL (ref 0.61–1.24)
Glucose, Bld: 104 mg/dL — ABNORMAL HIGH (ref 65–99)
POTASSIUM: 3.9 mmol/L (ref 3.5–5.1)
SODIUM: 135 mmol/L (ref 135–145)

## 2015-11-03 LAB — BLOOD CULTURE ID PANEL (REFLEXED)
ACINETOBACTER BAUMANNII: NOT DETECTED
CANDIDA ALBICANS: NOT DETECTED
CANDIDA GLABRATA: NOT DETECTED
CANDIDA TROPICALIS: NOT DETECTED
Candida krusei: NOT DETECTED
Candida parapsilosis: NOT DETECTED
Carbapenem resistance: NOT DETECTED
ENTEROBACTER CLOACAE COMPLEX: NOT DETECTED
ENTEROBACTERIACEAE SPECIES: NOT DETECTED
ENTEROCOCCUS SPECIES: NOT DETECTED
ESCHERICHIA COLI: NOT DETECTED
HAEMOPHILUS INFLUENZAE: NOT DETECTED
Klebsiella oxytoca: NOT DETECTED
Klebsiella pneumoniae: NOT DETECTED
Listeria monocytogenes: NOT DETECTED
METHICILLIN RESISTANCE: NOT DETECTED
NEISSERIA MENINGITIDIS: NOT DETECTED
PROTEUS SPECIES: NOT DETECTED
PSEUDOMONAS AERUGINOSA: NOT DETECTED
STAPHYLOCOCCUS AUREUS BCID: NOT DETECTED
STREPTOCOCCUS PNEUMONIAE: NOT DETECTED
STREPTOCOCCUS PYOGENES: NOT DETECTED
STREPTOCOCCUS SPECIES: NOT DETECTED
Serratia marcescens: NOT DETECTED
Staphylococcus species: DETECTED — AB
Streptococcus agalactiae: NOT DETECTED
VANCOMYCIN RESISTANCE: NOT DETECTED

## 2015-11-03 LAB — URINE CULTURE: Culture: NO GROWTH

## 2015-11-03 MED ORDER — AMOXICILLIN-POT CLAVULANATE 875-125 MG PO TABS: 1.0000 | ORAL_TABLET | Freq: Two times a day (BID) | ORAL | Status: DC

## 2015-11-03 MED ORDER — AMOXICILLIN-POT CLAVULANATE 875-125 MG PO TABS
1.0000 | ORAL_TABLET | Freq: Two times a day (BID) | ORAL | Status: DC
  Administered 2015-11-03: 1 via ORAL
  Filled 2015-11-03: qty 1

## 2015-11-03 NOTE — Discharge Summary (Signed)
Name: Ronald [name] MRN: [id number] DOB: July 27, 1940 75 y.o. PCP: No primary care provider on file.  Date of Admission: 11/02/2015  9:28 AM Date of Discharge: 11/03/2015 Attending Physician: Axel Filler, MD  Discharge Diagnosis: Principal Problem:   Pneumonia Active Problems:   HTN (hypertension)   Altered mental state   Chronic pain syndrome   Polypharmacy   Seizure ([place])  Discharge Medications:   Medication List    STOP taking these medications        diazepam 5 MG tablet  Commonly known as:  VALIUM      TAKE these medications        albuterol 108 (90 Base) MCG/ACT inhaler  Commonly known as:  PROVENTIL HFA;VENTOLIN HFA  Inhale 2 puffs into the lungs every 6 (six) hours as needed for wheezing or shortness of breath.     amLODipine 5 MG tablet  Commonly known as:  NORVASC  Take 5 mg by mouth daily.     amoxicillin-clavulanate 875-125 MG tablet  Commonly known as:  AUGMENTIN  Take 1 tablet by mouth every 12 (twelve) hours.     aspirin 81 MG EC tablet  Take 1 tablet (81 mg total) by mouth daily.     atorvastatin 40 MG tablet  Commonly known as:  LIPITOR  Take 1 tablet (40 mg total) by mouth daily at 6 PM.     DULoxetine 60 MG capsule  Commonly known as:  CYMBALTA  Take 60 mg by mouth daily.     gabapentin 300 MG capsule  Commonly known as:  NEURONTIN  Take 300 mg by mouth 3 (three) times daily.     losartan 25 MG tablet  Commonly known as:  COZAAR  Take 1 tablet (25 mg total) by mouth daily.     oxyCODONE 15 MG immediate release tablet  Commonly known as:  ROXICODONE  Take 15 mg by mouth every 4 (four) hours as needed for pain. Up to 7 tablets in a day.     tamsulosin 0.4 MG Caps capsule  Commonly known as:  FLOMAX  Take 0.4 mg by mouth daily.        Disposition and follow-up:   Ronald [name] was discharged from [facility] in Good condition.  At the hospital follow up visit please address:  1.  Please consider  decreased dose of opioids, refer for sleep study for CPAP, and ensure pt has completed course of abx.   2.  Labs / imaging needed at time of follow-up: none  3.  Pending labs/ test needing follow-up: final read of blood cultures x 2 taken on 6/21, currently NGTD.     Procedures Performed:  Dg Chest 2 View  11/02/2015  CLINICAL DATA:  Fever x 1 day, eval known pna in right lung EXAM: CHEST - 2 VIEW COMPARISON:  11/02/2015 FINDINGS: Improved aeration. No focal infiltrate or overt edema. Mild cardiomegaly persists. Blunting of posterior costophrenic angles suggesting small effusions. No pneumothorax. Paired dorsal stimulator catheters in the lower thoracic spinal canal, stable. IMPRESSION: 1. Stable mild cardiomegaly. 2. Improved pulmonary aeration since prior study. 3. Small bilateral pleural effusions. Electronically Signed   By: Lucrezia Europe M.D.   On: 11/02/2015 23:01   Ct Head Wo Contrast  11/02/2015  CLINICAL DATA:  Seizure EXAM: CT HEAD WITHOUT CONTRAST TECHNIQUE: Contiguous axial images were obtained from the base of the skull through the vertex without intravenous contrast. COMPARISON:  CT 03/27/2006 FINDINGS: Ventricle size normal.  Cerebral volume normal for age Well-defined hypodensity in the right frontal periventricular white matter and basal ganglia consistent with chronic infarction. This was not present previously. Progression of chronic microvascular ischemic changes in the left frontal white matter also noted. Negative for acute infarct.  Negative for intracranial hemorrhage. Left middle cranial fossa arachnoid cyst measuring 32 x 37 mm is unchanged from prior studies No lesion of the calvarium. IMPRESSION: Chronic microvascular ischemic changes have progressed since 2007. No acute abnormality. 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   On: 11/02/2015 16:25   Dg Chest Port 1 View  11/02/2015  CLINICAL DATA:  Weakness.  Cough. EXAM: PORTABLE  CHEST 1 VIEW COMPARISON:  11/05/2012 . FINDINGS: Cardiomegaly. Mild pulmonary vascular prominence. Mild interstitial prominence. Mild congestive heart failure cannot be excluded. Mild right lower lobe atelectasis and or infiltrate. Small right pleural effusion. No pneumothorax IMPRESSION: 1. Cardiomegaly with mild pulmonary vascular prominence. Mild interstitial prominence. Small right pleural effusion . Mild congestive heart failure cannot be excluded . 2.  Mild right lower lobe atelectasis and/or infiltrate. Electronically Signed   By: Marcello Moores  Register   On: 11/02/2015 10:29    Admission HPI: 24M with past medical history of HTN, BPH, chronic back pain, and anxiety who presented with AMS. Per patient, yesterday he was in his usual state of health. He woke up to use the bathroom and had to call his wife to help him walk back to his bed due to pain in b/l anterior thighs. He was able to get back to his bed with assistance, he then took an additional 1.5 tab of oxycodone 15mg  for pain. This morning when his wife tried to wake him up for breakfast she noticed that he was blue and was difficult to arouse. She noted that he had gurgling breath sounds and when she slapped him to wake up him his eyes rolled back. She called EMS and started chest compressions. On the son's arrival to their house he noted that the patient was shaking all over and was confused. His wife notes that he had urinary incontinence this morning during episode of AMS. Pt does not remember anything after going to sleep last night after taking his extra dose of pain meds. He does not remember being in the emergency department. He has had a productive cough x 3-4 days with pink sputum. Denies fevers, chills, weight loss, sick contacts, nausea, vomiting, and diarrhea. He was recently started on ketoconazole for tinea pedis. He follows with [facility] for chronic back pain and takes oxycodone 15mg  q4h, he denies taking anything else for  pain. He also takes 2.5mg  valium qhs. He reports chest pain on b/l sides of his sternum, he states this has been going on for a year and is not associated with exertion. He will taking asa which takes the chest pain away. Chest pain is dull and starts at his back and radiates to his jaw and chest. He had a stress test in 2014 that was low risk.   In the ED he had a temperature of 103F, CXR was positive for RLL infiltrate, lactic acid was 2.20 and BNP 94.2. He was started on vanc/zosyn, and given 3L NS bolus.   Hospital Course by problem list: Principal Problem:   Pneumonia Active Problems:   HTN (hypertension)   Altered mental state   Chronic pain syndrome   Polypharmacy   Seizure ([facility])   Altered mental status 2/2 polypharmacy in  setting presumed sleep apnea-- pt is on a high dose opioid regimen of 15mg  q4h scheduled along with valium 2.25 qhs and gabapentin 300mg  TID. He also is obese with a lg neck circumference with likely underlying sleep apnea which would make him at increased risk for hypercarbic respiratory failure. He was recently started on ketoconazole which he last took yesterday which increases the concentration of oxycodone. Likely his AMS this morning was due to all of those above factors; acute hypercarbic respiratory failure in the setting of increased opioid use and recent ketoconazole use which further increased serum opioid concentrations. UDS + for benzos and opiates as would be expected. UA neg for UTI. TSH wnl. EEG neg for epileptiform activity, CT head negative. Held ketoconazole. Will need outpatient sleep study for CPAP machine. Counseled on effects of narcotics and other sedating meds in combination with underlying sleep apnea. Advised to cut down on narcotics and benzo use.   Aspiration pna vs CAP-- CXR positive for RLL infiltrate and pt was febrile on admission which could be the result of aspiration pneumonia from his AMS vs community acquired pna as he has had increased  cough w/ sputum production the past few days. Denies fevers at home. He was given dose of vanc and zosyn in the ED. Procalcitoninnegative. Received dose of azithromycin and rocephin for CAP. Repeat 2 view CXR negative for infiltrate or consolidation. He was d/c'd home w/ augmentin.   Discharge Vitals:   BP 147/63 mmHg  Pulse 74  Temp(Src) 98.2 F (36.8 C) (Oral)  Resp 17  Ht 5\' 6"  (1.676 m)  Wt 214 lb 8 oz (97.297 kg)  BMI 34.64 kg/m2  SpO2 96%  Discharge Labs:  Results for orders placed or performed during the hospital encounter of 11/02/15 (from the past 24 hour(s))  CG4 I-STAT (Lactic acid)     Status: None   Collection Time: 11/02/15  1:12 PM  Result Value Ref Range   Lactic Acid, Venous 1.09 0.5 - 2.0 mmol/L  TSH     Status: None   Collection Time: 11/02/15  3:08 PM  Result Value Ref Range   TSH 0.411 0.350 - 4.500 uIU/mL  Troponin I (q 6hr x 3)     Status: None   Collection Time: 11/02/15  3:08 PM  Result Value Ref Range   Troponin I <0.03 <0.031 ng/mL  Procalcitonin - Baseline     Status: None   Collection Time: 11/02/15  3:08 PM  Result Value Ref Range   Procalcitonin <0.10 ng/mL  Urine rapid drug screen (hosp performed)     Status: Abnormal   Collection Time: 11/02/15  3:38 PM  Result Value Ref Range   Opiates POSITIVE (A) NONE DETECTED   Cocaine NONE DETECTED NONE DETECTED   Benzodiazepines POSITIVE (A) NONE DETECTED   Amphetamines NONE DETECTED NONE DETECTED   Tetrahydrocannabinol NONE DETECTED NONE DETECTED   Barbiturates NONE DETECTED NONE DETECTED  CK     Status: Abnormal   Collection Time: 11/02/15  4:34 PM  Result Value Ref Range   Total CK 30 (L) 49 - 397 U/L  Basic metabolic panel     Status: Abnormal   Collection Time: 11/03/15  5:48 AM  Result Value Ref Range   Sodium 135 135 - 145 mmol/L   Potassium 3.9 3.5 - 5.1 mmol/L   Chloride 102 101 - 111 mmol/L   CO2 26 22 - 32 mmol/L   Glucose, Bld 104 (H) 65 - 99 mg/dL   BUN 16  6 - 20 mg/dL    Creatinine, Ser 1.12 0.61 - 1.24 mg/dL   Calcium 8.3 (L) 8.9 - 10.3 mg/dL   GFR calc non Af Amer >60 >60 mL/min   GFR calc Af Amer >60 >60 mL/min   Anion gap 7 5 - 15  CBC     Status: Abnormal   Collection Time: 11/03/15  5:48 AM  Result Value Ref Range   WBC 6.3 4.0 - 10.5 K/uL   RBC 3.51 (L) 4.22 - 5.81 MIL/uL   Hemoglobin 10.9 (L) 13.0 - 17.0 g/dL   HCT 33.5 (L) 39.0 - 52.0 %   MCV 95.4 78.0 - 100.0 fL   MCH 31.1 26.0 - 34.0 pg   MCHC 32.5 30.0 - 36.0 g/dL   RDW 13.6 11.5 - 15.5 %   Platelets 169 150 - 400 K/uL  Culture, blood (routine x 2)     Status: None (Preliminary result)   Collection Time: 11/03/15 10:36 AM  Result Value Ref Range   Specimen Description BLOOD RIGHT ANTECUBITAL    Special Requests IN PEDIATRIC BOTTLE  3CC    Culture PENDING    Report Status PENDING     Signed: Norman Herrlich, MD 11/03/2015, 11:28 AM

## 2015-11-03 NOTE — Progress Notes (Signed)
  PHARMACY - PHYSICIAN COMMUNICATION CRITICAL VALUE ALERT - BLOOD CULTURE IDENTIFICATION (BCID)  Results for orders placed or performed during the hospital encounter of 11/02/15  Blood Culture ID Panel (Reflexed) (Collected: 11/02/2015  9:50 AM)  Result Value Ref Range   Enterococcus species NOT DETECTED NOT DETECTED   Vancomycin resistance NOT DETECTED NOT DETECTED   Listeria monocytogenes NOT DETECTED NOT DETECTED   Staphylococcus species DETECTED (A) NOT DETECTED   Staphylococcus aureus NOT DETECTED NOT DETECTED   Methicillin resistance NOT DETECTED NOT DETECTED   Streptococcus species NOT DETECTED NOT DETECTED   Streptococcus agalactiae NOT DETECTED NOT DETECTED   Streptococcus pneumoniae NOT DETECTED NOT DETECTED   Streptococcus pyogenes NOT DETECTED NOT DETECTED   Acinetobacter baumannii NOT DETECTED NOT DETECTED   Enterobacteriaceae species NOT DETECTED NOT DETECTED   Enterobacter cloacae complex NOT DETECTED NOT DETECTED   Escherichia coli NOT DETECTED NOT DETECTED   Klebsiella oxytoca NOT DETECTED NOT DETECTED   Klebsiella pneumoniae NOT DETECTED NOT DETECTED   Proteus species NOT DETECTED NOT DETECTED   Serratia marcescens NOT DETECTED NOT DETECTED   Carbapenem resistance NOT DETECTED NOT DETECTED   Haemophilus influenzae NOT DETECTED NOT DETECTED   Neisseria meningitidis NOT DETECTED NOT DETECTED   Pseudomonas aeruginosa NOT DETECTED NOT DETECTED   Candida albicans NOT DETECTED NOT DETECTED   Candida glabrata NOT DETECTED NOT DETECTED   Candida krusei NOT DETECTED NOT DETECTED   Candida parapsilosis NOT DETECTED NOT DETECTED   Candida tropicalis NOT DETECTED NOT DETECTED    Name of physician (or Provider) Contacted: Dr. Hulen Luster  Changes to prescribed antibiotics required: 1 out of 2 bottles positive with coag negative staph. Likely contaminant. No changes needed.   Braxtin Bamba L. Nicole Kindred, PharmD PGY2 Infectious Diseases Pharmacy Resident Pager: [phone] 11/03/2015 9:01  AM

## 2015-11-03 NOTE — Care Management Note (Signed)
Case Management Note  Patient Details  Name: Ronald [name] MRN: [id number] Date of Birth: 1940/07/12  Subjective/Objective:                 Spoke with patient, spouse, and their son in room. They deny needing CM assistance for HH/ DME.    Action/Plan:  Will continue to follow.  Expected Discharge Date:                  Expected Discharge Plan:  Home/Self Care  In-House Referral:     Discharge planning Services  CM Consult  Post Acute Care Choice:    Choice offered to:     DME Arranged:    DME Agency:     HH Arranged:    HH Agency:     Status of Service:  In process, will continue to follow  If discussed at Long Length of Stay Meetings, dates discussed:    Additional Comments:  Carles Collet, RN 11/03/2015, 9:35 AM

## 2015-11-03 NOTE — Progress Notes (Signed)
Subjective: Pt doing well this morning, no complaints.   Objective: Vital signs in last 24 hours: Filed Vitals:   11/02/15 1643 11/02/15 2153 11/02/15 2200 11/03/15 0500  BP:    147/63  Pulse: 85 81  74  Temp: 100.9 F (38.3 C)   98.2 F (36.8 C)  TempSrc: Oral   Oral  Resp: 16   17  Height:      Weight:      SpO2:  90% 95% 96%   Weight change:   Intake/Output Summary (Last 24 hours) at 11/03/15 1050 Last data filed at 11/03/15 [id number]  Gross per 24 hour  Intake   2290 ml  Output   2325 ml  Net    -35 ml   General: NAD, laying in bed comfortably HEENT: moist mucous membrane, mallampati score 4 Lungs: CTAB, no wheezing, good air movement Cardiac: RRR, no murmurs GI: soft, active bowel sounds Neuro: CN II-XII grossly intact  Lab Results: Basic Metabolic Panel:  Recent Labs Lab 11/02/15 0950 11/02/15 1008 11/03/15 0548  NA 137 138 135  K 4.6 4.4 3.9  CL 103 101 102  CO2 26  --  26  GLUCOSE 110* 112* 104*  BUN 24* 27* 16  CREATININE 1.37* 1.30* 1.12  CALCIUM 9.0  --  8.3*   Liver Function Tests:  Recent Labs Lab 11/02/15 0950  AST 22  ALT 18  ALKPHOS 62  BILITOT 0.6  PROT 6.5  ALBUMIN 4.0   CBC:  Recent Labs Lab 11/02/15 1033 11/03/15 0548  WBC 8.9 6.3  NEUTROABS 7.7  --   HGB 11.4* 10.9*  HCT 36.1* 33.5*  MCV 96.3 95.4  PLT 165 169   Cardiac Enzymes:  Recent Labs Lab 11/02/15 1508 11/02/15 1634  CKTOTAL  --  30*  TROPONINI <0.03  --    Thyroid Function Tests:  Recent Labs Lab 11/02/15 1508  TSH 0.411   Urinalysis:  Recent Labs Lab 11/02/15 1016  COLORURINE YELLOW  LABSPEC 1.025  PHURINE 5.0  GLUCOSEU NEGATIVE  HGBUR NEGATIVE  BILIRUBINUR NEGATIVE  KETONESUR NEGATIVE  PROTEINUR NEGATIVE  NITRITE NEGATIVE  LEUKOCYTESUR NEGATIVE    Micro Results: Recent Results (from the past 240 hour(s))  Blood Culture (routine x 2)     Status: None (Preliminary result)   Collection Time: 11/02/15  9:50 AM  Result Value Ref  Range Status   Specimen Description BLOOD RIGHT HAND  Final   Special Requests BLOOD AEROBIC BOTTLE 4CC  Final   Culture  Setup Time   Final    GRAM POSITIVE COCCI IN CLUSTERS AEROBIC BOTTLE ONLY CRITICAL RESULT CALLED TO, READ BACK BY AND VERIFIED WITH: C. STEWART [place] AT [id number] ON 11/03/15 BY C. JESSUP, MLT.    Culture GRAM POSITIVE COCCI IN CLUSTERS  Final   Report Status PENDING  Incomplete  Blood Culture ID Panel (Reflexed)     Status: Abnormal   Collection Time: 11/02/15  9:50 AM  Result Value Ref Range Status   Enterococcus species NOT DETECTED NOT DETECTED Final   Vancomycin resistance NOT DETECTED NOT DETECTED Final   Listeria monocytogenes NOT DETECTED NOT DETECTED Final   Staphylococcus species DETECTED (A) NOT DETECTED Final    Comment: CRITICAL RESULT CALLED TO, READ BACK BY AND VERIFIED WITH: C. STEWART, RPH AT [id number] ON 11/03/15 BY  C.JESSUP, MLT.    Staphylococcus aureus NOT DETECTED NOT DETECTED Final   Methicillin resistance NOT DETECTED NOT DETECTED Final   Streptococcus species NOT DETECTED NOT DETECTED  Final   Streptococcus agalactiae NOT DETECTED NOT DETECTED Final   Streptococcus pneumoniae NOT DETECTED NOT DETECTED Final   Streptococcus pyogenes NOT DETECTED NOT DETECTED Final   Acinetobacter baumannii NOT DETECTED NOT DETECTED Final   Enterobacteriaceae species NOT DETECTED NOT DETECTED Final   Enterobacter cloacae complex NOT DETECTED NOT DETECTED Final   Escherichia coli NOT DETECTED NOT DETECTED Final   Klebsiella oxytoca NOT DETECTED NOT DETECTED Final   Klebsiella pneumoniae NOT DETECTED NOT DETECTED Final   Proteus species NOT DETECTED NOT DETECTED Final   Serratia marcescens NOT DETECTED NOT DETECTED Final   Carbapenem resistance NOT DETECTED NOT DETECTED Final   Haemophilus influenzae NOT DETECTED NOT DETECTED Final   Neisseria meningitidis NOT DETECTED NOT DETECTED Final   Pseudomonas aeruginosa NOT DETECTED NOT DETECTED Final   Candida albicans NOT  DETECTED NOT DETECTED Final   Candida glabrata NOT DETECTED NOT DETECTED Final   Candida krusei NOT DETECTED NOT DETECTED Final   Candida parapsilosis NOT DETECTED NOT DETECTED Final   Candida tropicalis NOT DETECTED NOT DETECTED Final  Urine culture     Status: None   Collection Time: 11/02/15 10:16 AM  Result Value Ref Range Status   Specimen Description URINE, RANDOM  Final   Special Requests NONE  Final   Culture NO GROWTH  Final   Report Status 11/03/2015 FINAL  Final   Studies/Results: Dg Chest 2 View  11/02/2015  CLINICAL DATA:  Fever x 1 day, eval known pna in right lung EXAM: CHEST - 2 VIEW COMPARISON:  11/02/2015 FINDINGS: Improved aeration. No focal infiltrate or overt edema. Mild cardiomegaly persists. Blunting of posterior costophrenic angles suggesting small effusions. No pneumothorax. Paired dorsal stimulator catheters in the lower thoracic spinal canal, stable. IMPRESSION: 1. Stable mild cardiomegaly. 2. Improved pulmonary aeration since prior study. 3. Small bilateral pleural effusions. Electronically Signed   By: Lucrezia Europe M.D.   On: 11/02/2015 23:01   Ct Head Wo Contrast  11/02/2015  CLINICAL DATA:  Seizure EXAM: CT HEAD WITHOUT CONTRAST TECHNIQUE: Contiguous axial images were obtained from the base of the skull through the vertex without intravenous contrast. COMPARISON:  CT 03/27/2006 FINDINGS: Ventricle size normal.  Cerebral volume normal for age Well-defined hypodensity in the right frontal periventricular white matter and basal ganglia consistent with chronic infarction. This was not present previously. Progression of chronic microvascular ischemic changes in the left frontal white matter also noted. Negative for acute infarct.  Negative for intracranial hemorrhage. Left middle cranial fossa arachnoid cyst measuring 32 x 37 mm is unchanged from prior studies No lesion of the calvarium. IMPRESSION: Chronic microvascular ischemic changes have progressed since 2007. No acute  abnormality. 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   On: 11/02/2015 16:25   Dg Chest Port 1 View  11/02/2015  CLINICAL DATA:  Weakness.  Cough. EXAM: PORTABLE CHEST 1 VIEW COMPARISON:  11/05/2012 . FINDINGS: Cardiomegaly. Mild pulmonary vascular prominence. Mild interstitial prominence. Mild congestive heart failure cannot be excluded. Mild right lower lobe atelectasis and or infiltrate. Small right pleural effusion. No pneumothorax IMPRESSION: 1. Cardiomegaly with mild pulmonary vascular prominence. Mild interstitial prominence. Small right pleural effusion . Mild congestive heart failure cannot be excluded . 2.  Mild right lower lobe atelectasis and/or infiltrate. Electronically Signed   By: Marcello Moores  Register   On: 11/02/2015 10:29   Medications: I have reviewed the patient's current medications. Scheduled Meds: . amLODipine  5 mg Oral Daily  . aspirin  EC  81 mg Oral Daily  . azithromycin  250 mg Oral q1800  . cefTRIAXone (ROCEPHIN)  IV  2 g Intravenous Q24H  . diclofenac sodium  2 g Topical QID  . DULoxetine  60 mg Oral Daily  . enoxaparin (LOVENOX) injection  40 mg Subcutaneous Q24H  . polyethylene glycol  17 g Oral Daily  . tamsulosin  0.4 mg Oral Daily   Continuous Infusions:  PRN Meds:.oxyCODONE Assessment/Plan: Principal Problem:   Pneumonia Active Problems:   HTN (hypertension)   Altered mental state   Chronic pain syndrome   Polypharmacy   Seizure ([place])  AMS 2/2 polypharmacy in setting presumed sleep apnea-- Pt feels well today. He is mallampati score of 4 and his wife states he snores at night. Counseled on effects of narcotics and other sedating meds in combination with underlying sleep apnea. Advised to cut down on narcotics.  - recommend outpatient sleep study   Aspiration pneumonitis-- 2 view CXR negative for infiltrate or consolidation, + for small b/l pleural effusions. Received dose of vanc and zosyn in the ED and  started on azithromycin and IV rocephin for CAP coverage. He does not have a leukocytosis and pro calcitonin has been negative Likely pt had aspiration pneumonitis during episode of AMS yesterday morning. Will send pt home w/ augmentin x 6 more days.  - repeat blood cultures ordered to follow up on 1 of 2 blood cultures growing coag neg staph   HTN - continue home norvasc 5mg  - resume all home meds on d/c.   Chronic back pain - continue oxycodone but at reduced dose of 10mg  q4h scheduled. Counseled on opioid risks and benefits.   Anxiety-- holding home valium, continue cymbalta  DVT ppx-- lovenox  Dispo: Discharge home today after repeat blood cultures obtained.   The patient does have a current PCP (No primary care provider on file.) and does not need an [facility] hospital follow-up appointment after discharge.  The patient does not have transportation limitations that hinder transportation to clinic appointments.     LOS: 1 day   Norman Herrlich, MD 11/03/2015, 10:50 AM

## 2015-11-03 NOTE — Progress Notes (Signed)
Patient discharge teaching given, including activity, diet, follow up appointment, and mediations. Patient verbalized understanding of all discharge instructions. IV access and telemetry was discontinued. Vitals are stable, Skin intact except as charted in most recent assessments. Patient to be escorted out by [facility], to be driven home by family.

## 2015-11-03 NOTE — Progress Notes (Signed)
Subjective: No acute events overnight.  No complaints this morning.  Denies cough, chest pain and shortness of breath.   Objective: Vital signs in last 24 hours: Filed Vitals:   11/02/15 1643 11/02/15 2153 11/02/15 2200 11/03/15 0500  BP:    147/63  Pulse: 85 81  74  Temp: 100.9 F (38.3 C)   98.2 F (36.8 C)  TempSrc: Oral   Oral  Resp: 16   17  Height:      Weight:      SpO2:  90% 95% 96%   Weight change:   Intake/Output Summary (Last 24 hours) at 11/03/15 1111 Last data filed at 11/03/15 0851  Gross per 24 hour  Intake   2290 ml  Output   2325 ml  Net    -35 ml    Physical Exam General: alert, no acute distress, cooperative, on room air   Cardiovascular:  Regular rate and rhythm, no murmurs, no rubs, no gallops Pulmonary: clear to ausculation bilaterally Abdominal:  Normal active bowel sounds, nontender Extermities: no peripheral edema  Lab Results: Basic Metabolic Panel:  Recent Labs  11/02/15 0950 11/02/15 1008 11/03/15 0548  NA 137 138 135  K 4.6 4.4 3.9  CL 103 101 102  CO2 26  --  26  GLUCOSE 110* 112* 104*  BUN 24* 27* 16  CREATININE 1.37* 1.30* 1.12  CALCIUM 9.0  --  8.3*   Liver Function Tests:  Recent Labs  11/02/15 0950  AST 22  ALT 18  ALKPHOS 62  BILITOT 0.6  PROT 6.5  ALBUMIN 4.0   CBC:  Recent Labs  11/02/15 1033 11/03/15 0548  WBC 8.9 6.3  NEUTROABS 7.7  --   HGB 11.4* 10.9*  HCT 36.1* 33.5*  MCV 96.3 95.4  PLT 165 169   Urine Drug Screen: Drugs of Abuse     Component Value Date/Time   LABOPIA POSITIVE* 11/02/2015 1538   COCAINSCRNUR NONE DETECTED 11/02/2015 1538   LABBENZ POSITIVE* 11/02/2015 1538   AMPHETMU NONE DETECTED 11/02/2015 1538   THCU NONE DETECTED 11/02/2015 1538   LABBARB NONE DETECTED 11/02/2015 1538    Alcohol Level: No results for input(s): ETH in the last 72 hours. Urinalysis:  Recent Labs  11/02/15 1016  COLORURINE YELLOW  LABSPEC 1.025  PHURINE 5.0  GLUCOSEU NEGATIVE  HGBUR  NEGATIVE  BILIRUBINUR NEGATIVE  KETONESUR NEGATIVE  PROTEINUR NEGATIVE  NITRITE NEGATIVE  LEUKOCYTESUR NEGATIVE    Micro Results: Recent Results (from the past 240 hour(s))  Blood Culture (routine x 2)     Status: None (Preliminary result)   Collection Time: 11/02/15  9:50 AM  Result Value Ref Range Status   Specimen Description BLOOD RIGHT HAND  Final   Special Requests BLOOD AEROBIC BOTTLE 4CC  Final   Culture  Setup Time   Final    GRAM POSITIVE COCCI IN CLUSTERS AEROBIC BOTTLE ONLY CRITICAL RESULT CALLED TO, READ BACK BY AND VERIFIED WITH: C. STEWART [place] AT [id number] ON 11/03/15 BY C. JESSUP, MLT.    Culture GRAM POSITIVE COCCI IN CLUSTERS  Final   Report Status PENDING  Incomplete  Blood Culture ID Panel (Reflexed)     Status: Abnormal   Collection Time: 11/02/15  9:50 AM  Result Value Ref Range Status   Enterococcus species NOT DETECTED NOT DETECTED Final   Vancomycin resistance NOT DETECTED NOT DETECTED Final   Listeria monocytogenes NOT DETECTED NOT DETECTED Final   Staphylococcus species DETECTED (A) NOT DETECTED Final    Comment:  CRITICAL RESULT CALLED TO, READ BACK BY AND VERIFIED WITH: C. STEWART, [place] AT [id number] ON 11/03/15 BY  C.JESSUP, MLT.    Staphylococcus aureus NOT DETECTED NOT DETECTED Final   Methicillin resistance NOT DETECTED NOT DETECTED Final   Streptococcus species NOT DETECTED NOT DETECTED Final   Streptococcus agalactiae NOT DETECTED NOT DETECTED Final   Streptococcus pneumoniae NOT DETECTED NOT DETECTED Final   Streptococcus pyogenes NOT DETECTED NOT DETECTED Final   Acinetobacter baumannii NOT DETECTED NOT DETECTED Final   Enterobacteriaceae species NOT DETECTED NOT DETECTED Final   Enterobacter cloacae complex NOT DETECTED NOT DETECTED Final   Escherichia coli NOT DETECTED NOT DETECTED Final   Klebsiella oxytoca NOT DETECTED NOT DETECTED Final   Klebsiella pneumoniae NOT DETECTED NOT DETECTED Final   Proteus species NOT DETECTED NOT DETECTED Final    Serratia marcescens NOT DETECTED NOT DETECTED Final   Carbapenem resistance NOT DETECTED NOT DETECTED Final   Haemophilus influenzae NOT DETECTED NOT DETECTED Final   Neisseria meningitidis NOT DETECTED NOT DETECTED Final   Pseudomonas aeruginosa NOT DETECTED NOT DETECTED Final   Candida albicans NOT DETECTED NOT DETECTED Final   Candida glabrata NOT DETECTED NOT DETECTED Final   Candida krusei NOT DETECTED NOT DETECTED Final   Candida parapsilosis NOT DETECTED NOT DETECTED Final   Candida tropicalis NOT DETECTED NOT DETECTED Final  Urine culture     Status: None   Collection Time: 11/02/15 10:16 AM  Result Value Ref Range Status   Specimen Description URINE, RANDOM  Final   Special Requests NONE  Final   Culture NO GROWTH  Final   Report Status 11/03/2015 FINAL  Final   Studies/Results: Dg Chest 2 View  11/02/2015  CLINICAL DATA:  Fever x 1 day, eval known pna in right lung EXAM: CHEST - 2 VIEW COMPARISON:  11/02/2015 FINDINGS: Improved aeration. No focal infiltrate or overt edema. Mild cardiomegaly persists. Blunting of posterior costophrenic angles suggesting small effusions. No pneumothorax. Paired dorsal stimulator catheters in the lower thoracic spinal canal, stable. IMPRESSION: 1. Stable mild cardiomegaly. 2. Improved pulmonary aeration since prior study. 3. Small bilateral pleural effusions. Electronically Signed   By: Lucrezia [place] M.D.   On: 11/02/2015 23:01   Ct Head Wo Contrast  11/02/2015  CLINICAL DATA:  Seizure EXAM: CT HEAD WITHOUT CONTRAST TECHNIQUE: Contiguous axial images were obtained from the base of the skull through the vertex without intravenous contrast. COMPARISON:  CT 03/27/2006 FINDINGS: Ventricle size normal.  Cerebral volume normal for age Well-defined hypodensity in the right frontal periventricular white matter and basal ganglia consistent with chronic infarction. This was not present previously. Progression of chronic microvascular ischemic changes in the left  frontal white matter also noted. Negative for acute infarct.  Negative for intracranial hemorrhage. Left middle cranial fossa arachnoid cyst measuring 32 x 37 mm is unchanged from prior studies No lesion of the calvarium. IMPRESSION: Chronic microvascular ischemic changes have progressed since 2007. No acute abnormality. 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   On: 11/02/2015 16:25   Dg Chest Port 1 View  11/02/2015  CLINICAL DATA:  Weakness.  Cough. EXAM: PORTABLE CHEST 1 VIEW COMPARISON:  11/05/2012 . FINDINGS: Cardiomegaly. Mild pulmonary vascular prominence. Mild interstitial prominence. Mild congestive heart failure cannot be excluded. Mild right lower lobe atelectasis and or infiltrate. Small right pleural effusion. No pneumothorax IMPRESSION: 1. Cardiomegaly with mild pulmonary vascular prominence. Mild interstitial prominence. Small right pleural effusion . Mild congestive heart failure  cannot be excluded . 2.  Mild right lower lobe atelectasis and/or infiltrate. Electronically Signed   By: [place]   On: 11/02/2015 10:29   Medications:  Scheduled Meds: . amLODipine  5 mg Oral Daily  . aspirin EC  81 mg Oral Daily  . azithromycin  250 mg Oral q1800  . cefTRIAXone (ROCEPHIN)  IV  2 g Intravenous Q24H  . diclofenac sodium  2 g Topical QID  . DULoxetine  60 mg Oral Daily  . enoxaparin (LOVENOX) injection  40 mg Subcutaneous Q24H  . polyethylene glycol  17 g Oral Daily  . tamsulosin  0.4 mg Oral Daily   Continuous Infusions:  PRN Meds:.oxyCODONE  Problem List Principal Problem:   Pneumonia Active Problems:   HTN (hypertension)   Altered mental state   Chronic pain syndrome   Polypharmacy   Seizure (HCC)   Assessment/Plan: Ronald [name] is a 75 y.o. male with a history of HTN, BPH and chronic paint presenting with altered mental status and found to have CXR findings concerning for pneuomonia  Pneumonia: Patient is afebrile with  normal WBC (6.9 today). O2 saturation has improved and patient is saturating 96% ORA.  Etiology of pneumonia likely aspiration pneumonia in the setting of acute hypercapnia due to polypharmacy based on location of CXR findings in the right lower lobe.  Patient's is nontoxic and lacks clinical features that would require hospital admission.  Plan to begin PO augmentin for coverage. Coag negative staph detected on 1 blood culture so far.  Likely contaminant.   -- urine and blood cultures and pending -- d/c IV ceftriaxone and PO Azithromycin -- begin PO Augmentin 875-125mg  q12h   Altered Mental Status: Differential includes polypharmacy vs. OSA vs. Infection.  Episode of AMS likely due acute on chronic hypercapnia in the setting of polypharmacy and possible underlying OSA.  Patient recently started taking Ketoconazole which influences the metabolism of oxycodone. This interaction may have increased the oxycodone levels which contributed to hypoxia.  Oxycodone was downtitrated form 15 to 10mg  q4h. Patient is obese, snores at night (per wife), and has a large neck circumference concerning for OSA. Further outpatient workup recommended.   Chronic Pain: On oxycodone 15mg  at home for chronic back pain. Home dose of Oxycodone was downtitrated 10mg  (as explained above) -- continue oxycodone 10mg  q4h -- Voltaren gel 2g QID -- Miralax for opioid induced constipation ppx  BPH -- resume home Tamsulosin 0.4mg  daily  PPX -- DVT: Lovenox  Diet: Regular Code Status: FULL  Dispo: likely today as medically and clinically improved. Recommended f/u with PCP and pain clinic  This is a Careers information officer Note.  The care of the patient was discussed with Dr. Hulen Luster and the assessment and plan formulated with their assistance.  Please see their attached note for official documentation of the daily encounter.   LOS: 1 day   Garald Braver, Med Student 11/03/2015, 11:11 AM

## 2015-11-03 NOTE — Discharge Instructions (Signed)
Set up hospital follow-up appoint with PCP in about a week  Set up follow-up appointment with your pain clinic to reassess pain medications   Please discuss with your PCP plans to slowly taper your valium dose.

## 2015-11-05 LAB — CULTURE, BLOOD (ROUTINE X 2)

## 2015-11-07 LAB — CULTURE, BLOOD (ROUTINE X 2): Culture: NO GROWTH

## 2015-11-08 LAB — CULTURE, BLOOD (ROUTINE X 2)
Culture: NO GROWTH
Culture: NO GROWTH

## 2015-11-11 DIAGNOSIS — J189 Pneumonia, unspecified organism: Secondary | ICD-10-CM | POA: Diagnosis not present

## 2015-11-11 DIAGNOSIS — G4733 Obstructive sleep apnea (adult) (pediatric): Secondary | ICD-10-CM | POA: Diagnosis not present

## 2015-11-18 DIAGNOSIS — G4733 Obstructive sleep apnea (adult) (pediatric): Secondary | ICD-10-CM | POA: Diagnosis not present

## 2015-12-09 DIAGNOSIS — R972 Elevated prostate specific antigen [PSA]: Secondary | ICD-10-CM | POA: Diagnosis not present

## 2015-12-09 DIAGNOSIS — G4733 Obstructive sleep apnea (adult) (pediatric): Secondary | ICD-10-CM | POA: Diagnosis not present

## 2015-12-09 DIAGNOSIS — J309 Allergic rhinitis, unspecified: Secondary | ICD-10-CM | POA: Diagnosis not present

## 2015-12-09 DIAGNOSIS — G5793 Unspecified mononeuropathy of bilateral lower limbs: Secondary | ICD-10-CM | POA: Diagnosis not present

## 2015-12-17 DIAGNOSIS — G894 Chronic pain syndrome: Secondary | ICD-10-CM | POA: Diagnosis not present

## 2015-12-17 DIAGNOSIS — M961 Postlaminectomy syndrome, not elsewhere classified: Secondary | ICD-10-CM | POA: Diagnosis not present

## 2015-12-17 DIAGNOSIS — M47817 Spondylosis without myelopathy or radiculopathy, lumbosacral region: Secondary | ICD-10-CM | POA: Diagnosis not present

## 2015-12-17 DIAGNOSIS — M47812 Spondylosis without myelopathy or radiculopathy, cervical region: Secondary | ICD-10-CM | POA: Diagnosis not present

## 2015-12-17 DIAGNOSIS — Z79891 Long term (current) use of opiate analgesic: Secondary | ICD-10-CM | POA: Diagnosis not present

## 2015-12-20 DIAGNOSIS — Z79891 Long term (current) use of opiate analgesic: Secondary | ICD-10-CM | POA: Diagnosis not present

## 2016-01-09 DIAGNOSIS — J309 Allergic rhinitis, unspecified: Secondary | ICD-10-CM | POA: Diagnosis not present

## 2016-01-09 DIAGNOSIS — G5793 Unspecified mononeuropathy of bilateral lower limbs: Secondary | ICD-10-CM | POA: Diagnosis not present

## 2016-01-09 DIAGNOSIS — R972 Elevated prostate specific antigen [PSA]: Secondary | ICD-10-CM | POA: Diagnosis not present

## 2016-01-09 DIAGNOSIS — G4733 Obstructive sleep apnea (adult) (pediatric): Secondary | ICD-10-CM | POA: Diagnosis not present

## 2016-01-13 DIAGNOSIS — M542 Cervicalgia: Secondary | ICD-10-CM | POA: Diagnosis not present

## 2016-01-18 ENCOUNTER — Other Ambulatory Visit: Payer: Self-pay | Admitting: Surgery

## 2016-01-18 DIAGNOSIS — M47812 Spondylosis without myelopathy or radiculopathy, cervical region: Secondary | ICD-10-CM | POA: Diagnosis not present

## 2016-01-18 DIAGNOSIS — G894 Chronic pain syndrome: Secondary | ICD-10-CM | POA: Diagnosis not present

## 2016-01-18 DIAGNOSIS — M47817 Spondylosis without myelopathy or radiculopathy, lumbosacral region: Secondary | ICD-10-CM | POA: Diagnosis not present

## 2016-01-18 DIAGNOSIS — M4802 Spinal stenosis, cervical region: Secondary | ICD-10-CM

## 2016-01-18 DIAGNOSIS — M542 Cervicalgia: Secondary | ICD-10-CM

## 2016-01-18 DIAGNOSIS — M961 Postlaminectomy syndrome, not elsewhere classified: Secondary | ICD-10-CM | POA: Diagnosis not present

## 2016-01-24 ENCOUNTER — Ambulatory Visit
Admission: RE | Admit: 2016-01-24 | Discharge: 2016-01-24 | Disposition: A | Discharge status: Home / Self Care | Payer: Medicare Other | Source: Ambulatory Visit | Attending: Surgery | Admitting: Surgery

## 2016-01-24 DIAGNOSIS — M542 Cervicalgia: Secondary | ICD-10-CM

## 2016-01-24 DIAGNOSIS — M4802 Spinal stenosis, cervical region: Secondary | ICD-10-CM | POA: Diagnosis not present

## 2016-02-07 DIAGNOSIS — M47812 Spondylosis without myelopathy or radiculopathy, cervical region: Secondary | ICD-10-CM | POA: Diagnosis not present

## 2016-02-09 DIAGNOSIS — G5793 Unspecified mononeuropathy of bilateral lower limbs: Secondary | ICD-10-CM | POA: Diagnosis not present

## 2016-02-09 DIAGNOSIS — J309 Allergic rhinitis, unspecified: Secondary | ICD-10-CM | POA: Diagnosis not present

## 2016-02-09 DIAGNOSIS — G4733 Obstructive sleep apnea (adult) (pediatric): Secondary | ICD-10-CM | POA: Diagnosis not present

## 2016-02-09 DIAGNOSIS — R972 Elevated prostate specific antigen [PSA]: Secondary | ICD-10-CM | POA: Diagnosis not present

## 2016-02-16 DIAGNOSIS — M961 Postlaminectomy syndrome, not elsewhere classified: Secondary | ICD-10-CM | POA: Diagnosis not present

## 2016-02-16 DIAGNOSIS — M47817 Spondylosis without myelopathy or radiculopathy, lumbosacral region: Secondary | ICD-10-CM | POA: Diagnosis not present

## 2016-02-16 DIAGNOSIS — M47812 Spondylosis without myelopathy or radiculopathy, cervical region: Secondary | ICD-10-CM | POA: Diagnosis not present

## 2016-02-16 DIAGNOSIS — G894 Chronic pain syndrome: Secondary | ICD-10-CM | POA: Diagnosis not present

## 2016-02-18 DIAGNOSIS — R0600 Dyspnea, unspecified: Secondary | ICD-10-CM | POA: Diagnosis not present

## 2016-02-24 DIAGNOSIS — R0600 Dyspnea, unspecified: Secondary | ICD-10-CM | POA: Diagnosis not present

## 2016-02-24 DIAGNOSIS — R942 Abnormal results of pulmonary function studies: Secondary | ICD-10-CM | POA: Diagnosis not present

## 2016-03-08 DIAGNOSIS — H25013 Cortical age-related cataract, bilateral: Secondary | ICD-10-CM | POA: Diagnosis not present

## 2016-03-08 DIAGNOSIS — H2513 Age-related nuclear cataract, bilateral: Secondary | ICD-10-CM | POA: Diagnosis not present

## 2016-03-08 DIAGNOSIS — H5203 Hypermetropia, bilateral: Secondary | ICD-10-CM | POA: Diagnosis not present

## 2016-03-08 DIAGNOSIS — H52203 Unspecified astigmatism, bilateral: Secondary | ICD-10-CM | POA: Diagnosis not present

## 2016-03-08 DIAGNOSIS — H524 Presbyopia: Secondary | ICD-10-CM | POA: Diagnosis not present

## 2016-03-10 DIAGNOSIS — G5793 Unspecified mononeuropathy of bilateral lower limbs: Secondary | ICD-10-CM | POA: Diagnosis not present

## 2016-03-10 DIAGNOSIS — G4733 Obstructive sleep apnea (adult) (pediatric): Secondary | ICD-10-CM | POA: Diagnosis not present

## 2016-03-10 DIAGNOSIS — R972 Elevated prostate specific antigen [PSA]: Secondary | ICD-10-CM | POA: Diagnosis not present

## 2016-03-10 DIAGNOSIS — J309 Allergic rhinitis, unspecified: Secondary | ICD-10-CM | POA: Diagnosis not present

## 2016-03-17 DIAGNOSIS — M47812 Spondylosis without myelopathy or radiculopathy, cervical region: Secondary | ICD-10-CM | POA: Diagnosis not present

## 2016-03-17 DIAGNOSIS — G894 Chronic pain syndrome: Secondary | ICD-10-CM | POA: Diagnosis not present

## 2016-03-17 DIAGNOSIS — M47817 Spondylosis without myelopathy or radiculopathy, lumbosacral region: Secondary | ICD-10-CM | POA: Diagnosis not present

## 2016-03-17 DIAGNOSIS — M961 Postlaminectomy syndrome, not elsewhere classified: Secondary | ICD-10-CM | POA: Diagnosis not present

## 2016-04-10 DIAGNOSIS — G5793 Unspecified mononeuropathy of bilateral lower limbs: Secondary | ICD-10-CM | POA: Diagnosis not present

## 2016-04-10 DIAGNOSIS — R972 Elevated prostate specific antigen [PSA]: Secondary | ICD-10-CM | POA: Diagnosis not present

## 2016-04-10 DIAGNOSIS — G4733 Obstructive sleep apnea (adult) (pediatric): Secondary | ICD-10-CM | POA: Diagnosis not present

## 2016-04-10 DIAGNOSIS — J309 Allergic rhinitis, unspecified: Secondary | ICD-10-CM | POA: Diagnosis not present

## 2016-04-17 DIAGNOSIS — M961 Postlaminectomy syndrome, not elsewhere classified: Secondary | ICD-10-CM | POA: Diagnosis not present

## 2016-04-17 DIAGNOSIS — M170 Bilateral primary osteoarthritis of knee: Secondary | ICD-10-CM | POA: Diagnosis not present

## 2016-04-17 DIAGNOSIS — K5900 Constipation, unspecified: Secondary | ICD-10-CM | POA: Diagnosis not present

## 2016-04-17 DIAGNOSIS — G894 Chronic pain syndrome: Secondary | ICD-10-CM | POA: Diagnosis not present

## 2016-04-17 DIAGNOSIS — M47812 Spondylosis without myelopathy or radiculopathy, cervical region: Secondary | ICD-10-CM | POA: Diagnosis not present

## 2016-04-18 DIAGNOSIS — M961 Postlaminectomy syndrome, not elsewhere classified: Secondary | ICD-10-CM | POA: Diagnosis not present

## 2016-04-18 DIAGNOSIS — Z79891 Long term (current) use of opiate analgesic: Secondary | ICD-10-CM | POA: Diagnosis not present

## 2016-04-18 DIAGNOSIS — M47812 Spondylosis without myelopathy or radiculopathy, cervical region: Secondary | ICD-10-CM | POA: Diagnosis not present

## 2016-04-18 DIAGNOSIS — G894 Chronic pain syndrome: Secondary | ICD-10-CM | POA: Diagnosis not present

## 2016-04-18 DIAGNOSIS — M47817 Spondylosis without myelopathy or radiculopathy, lumbosacral region: Secondary | ICD-10-CM | POA: Diagnosis not present

## 2016-05-10 DIAGNOSIS — R972 Elevated prostate specific antigen [PSA]: Secondary | ICD-10-CM | POA: Diagnosis not present

## 2016-05-10 DIAGNOSIS — G4733 Obstructive sleep apnea (adult) (pediatric): Secondary | ICD-10-CM | POA: Diagnosis not present

## 2016-05-10 DIAGNOSIS — J309 Allergic rhinitis, unspecified: Secondary | ICD-10-CM | POA: Diagnosis not present

## 2016-05-10 DIAGNOSIS — G5793 Unspecified mononeuropathy of bilateral lower limbs: Secondary | ICD-10-CM | POA: Diagnosis not present

## 2016-05-19 DIAGNOSIS — M47812 Spondylosis without myelopathy or radiculopathy, cervical region: Secondary | ICD-10-CM | POA: Diagnosis not present

## 2016-05-19 DIAGNOSIS — M961 Postlaminectomy syndrome, not elsewhere classified: Secondary | ICD-10-CM | POA: Diagnosis not present

## 2016-05-19 DIAGNOSIS — M47817 Spondylosis without myelopathy or radiculopathy, lumbosacral region: Secondary | ICD-10-CM | POA: Diagnosis not present

## 2016-05-19 DIAGNOSIS — G894 Chronic pain syndrome: Secondary | ICD-10-CM | POA: Diagnosis not present

## 2016-06-10 DIAGNOSIS — G4733 Obstructive sleep apnea (adult) (pediatric): Secondary | ICD-10-CM | POA: Diagnosis not present

## 2016-06-10 DIAGNOSIS — R972 Elevated prostate specific antigen [PSA]: Secondary | ICD-10-CM | POA: Diagnosis not present

## 2016-06-10 DIAGNOSIS — J309 Allergic rhinitis, unspecified: Secondary | ICD-10-CM | POA: Diagnosis not present

## 2016-06-10 DIAGNOSIS — G5793 Unspecified mononeuropathy of bilateral lower limbs: Secondary | ICD-10-CM | POA: Diagnosis not present

## 2016-06-12 DIAGNOSIS — G4733 Obstructive sleep apnea (adult) (pediatric): Secondary | ICD-10-CM | POA: Diagnosis not present

## 2016-06-19 DIAGNOSIS — M47812 Spondylosis without myelopathy or radiculopathy, cervical region: Secondary | ICD-10-CM | POA: Diagnosis not present

## 2016-06-19 DIAGNOSIS — M961 Postlaminectomy syndrome, not elsewhere classified: Secondary | ICD-10-CM | POA: Diagnosis not present

## 2016-06-19 DIAGNOSIS — G894 Chronic pain syndrome: Secondary | ICD-10-CM | POA: Diagnosis not present

## 2016-06-19 DIAGNOSIS — M47817 Spondylosis without myelopathy or radiculopathy, lumbosacral region: Secondary | ICD-10-CM | POA: Diagnosis not present

## 2016-06-30 DIAGNOSIS — G4733 Obstructive sleep apnea (adult) (pediatric): Secondary | ICD-10-CM | POA: Diagnosis not present

## 2016-07-11 DIAGNOSIS — G5793 Unspecified mononeuropathy of bilateral lower limbs: Secondary | ICD-10-CM | POA: Diagnosis not present

## 2016-07-11 DIAGNOSIS — J309 Allergic rhinitis, unspecified: Secondary | ICD-10-CM | POA: Diagnosis not present

## 2016-07-11 DIAGNOSIS — R972 Elevated prostate specific antigen [PSA]: Secondary | ICD-10-CM | POA: Diagnosis not present

## 2016-07-11 DIAGNOSIS — G4733 Obstructive sleep apnea (adult) (pediatric): Secondary | ICD-10-CM | POA: Diagnosis not present

## 2016-07-17 DIAGNOSIS — G894 Chronic pain syndrome: Secondary | ICD-10-CM | POA: Diagnosis not present

## 2016-07-17 DIAGNOSIS — M47812 Spondylosis without myelopathy or radiculopathy, cervical region: Secondary | ICD-10-CM | POA: Diagnosis not present

## 2016-07-17 DIAGNOSIS — M961 Postlaminectomy syndrome, not elsewhere classified: Secondary | ICD-10-CM | POA: Diagnosis not present

## 2016-07-17 DIAGNOSIS — M47817 Spondylosis without myelopathy or radiculopathy, lumbosacral region: Secondary | ICD-10-CM | POA: Diagnosis not present

## 2016-08-08 DIAGNOSIS — J309 Allergic rhinitis, unspecified: Secondary | ICD-10-CM | POA: Diagnosis not present

## 2016-08-08 DIAGNOSIS — G5793 Unspecified mononeuropathy of bilateral lower limbs: Secondary | ICD-10-CM | POA: Diagnosis not present

## 2016-08-08 DIAGNOSIS — G4733 Obstructive sleep apnea (adult) (pediatric): Secondary | ICD-10-CM | POA: Diagnosis not present

## 2016-08-08 DIAGNOSIS — R972 Elevated prostate specific antigen [PSA]: Secondary | ICD-10-CM | POA: Diagnosis not present

## 2016-08-17 DIAGNOSIS — G894 Chronic pain syndrome: Secondary | ICD-10-CM | POA: Diagnosis not present

## 2016-08-17 DIAGNOSIS — M47817 Spondylosis without myelopathy or radiculopathy, lumbosacral region: Secondary | ICD-10-CM | POA: Diagnosis not present

## 2016-08-17 DIAGNOSIS — M961 Postlaminectomy syndrome, not elsewhere classified: Secondary | ICD-10-CM | POA: Diagnosis not present

## 2016-08-17 DIAGNOSIS — M47812 Spondylosis without myelopathy or radiculopathy, cervical region: Secondary | ICD-10-CM | POA: Diagnosis not present

## 2016-09-08 DIAGNOSIS — R972 Elevated prostate specific antigen [PSA]: Secondary | ICD-10-CM | POA: Diagnosis not present

## 2016-09-08 DIAGNOSIS — J309 Allergic rhinitis, unspecified: Secondary | ICD-10-CM | POA: Diagnosis not present

## 2016-09-08 DIAGNOSIS — G5793 Unspecified mononeuropathy of bilateral lower limbs: Secondary | ICD-10-CM | POA: Diagnosis not present

## 2016-09-08 DIAGNOSIS — G4733 Obstructive sleep apnea (adult) (pediatric): Secondary | ICD-10-CM | POA: Diagnosis not present

## 2016-09-15 DIAGNOSIS — M47812 Spondylosis without myelopathy or radiculopathy, cervical region: Secondary | ICD-10-CM | POA: Diagnosis not present

## 2016-09-15 DIAGNOSIS — G894 Chronic pain syndrome: Secondary | ICD-10-CM | POA: Diagnosis not present

## 2016-09-15 DIAGNOSIS — M961 Postlaminectomy syndrome, not elsewhere classified: Secondary | ICD-10-CM | POA: Diagnosis not present

## 2016-09-15 DIAGNOSIS — M47817 Spondylosis without myelopathy or radiculopathy, lumbosacral region: Secondary | ICD-10-CM | POA: Diagnosis not present

## 2016-09-26 DIAGNOSIS — Z1389 Encounter for screening for other disorder: Secondary | ICD-10-CM | POA: Diagnosis not present

## 2016-09-26 DIAGNOSIS — R609 Edema, unspecified: Secondary | ICD-10-CM | POA: Diagnosis not present

## 2016-09-26 DIAGNOSIS — I872 Venous insufficiency (chronic) (peripheral): Secondary | ICD-10-CM | POA: Diagnosis not present

## 2016-09-26 DIAGNOSIS — I10 Essential (primary) hypertension: Secondary | ICD-10-CM | POA: Diagnosis not present

## 2016-10-03 DIAGNOSIS — R972 Elevated prostate specific antigen [PSA]: Secondary | ICD-10-CM | POA: Diagnosis not present

## 2016-10-03 DIAGNOSIS — N182 Chronic kidney disease, stage 2 (mild): Secondary | ICD-10-CM | POA: Diagnosis not present

## 2016-10-03 DIAGNOSIS — Z79899 Other long term (current) drug therapy: Secondary | ICD-10-CM | POA: Diagnosis not present

## 2016-10-03 DIAGNOSIS — E785 Hyperlipidemia, unspecified: Secondary | ICD-10-CM | POA: Diagnosis not present

## 2016-10-17 DIAGNOSIS — M961 Postlaminectomy syndrome, not elsewhere classified: Secondary | ICD-10-CM | POA: Diagnosis not present

## 2016-10-17 DIAGNOSIS — M47817 Spondylosis without myelopathy or radiculopathy, lumbosacral region: Secondary | ICD-10-CM | POA: Diagnosis not present

## 2016-10-17 DIAGNOSIS — M47812 Spondylosis without myelopathy or radiculopathy, cervical region: Secondary | ICD-10-CM | POA: Diagnosis not present

## 2016-10-17 DIAGNOSIS — G894 Chronic pain syndrome: Secondary | ICD-10-CM | POA: Diagnosis not present

## 2016-10-24 IMAGING — CT CT CERVICAL SPINE W/O CM
2 series · 10 of 14 positions shown, 12 images · non-contrast
Comparison: 05/13/2013

CLINICAL DATA: Left neck pain and extremity weakness for 6 months.

EXAM:
CT CERVICAL SPINE WITHOUT CONTRAST
TECHNIQUE: Multidetector CT imaging of the cervical spine was performed without
intravenous contrast. Multiplanar CT image reconstructions were also
generated.

[Series 2: cspine soft · axial · 0.23mm/px · z∈[-218,-88]mm · 5 of 99 slices shown]
[im 17/99  soft-tissue]
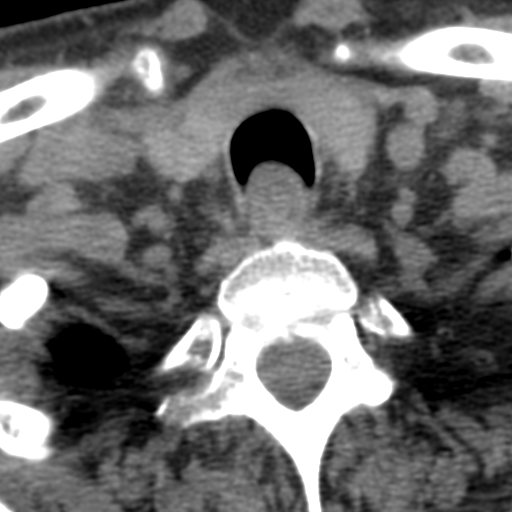
[im 33/99  soft-tissue]
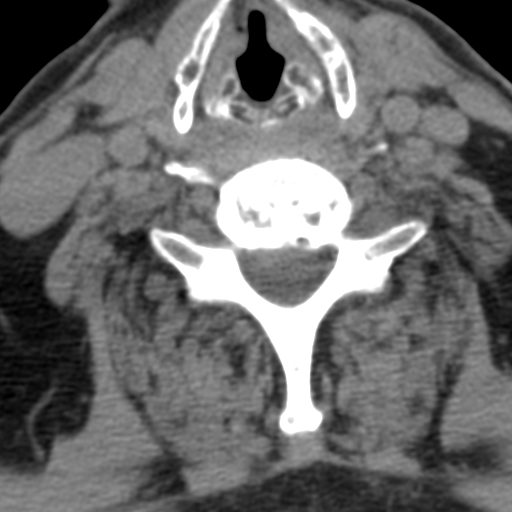
[im 50/99  soft-tissue]
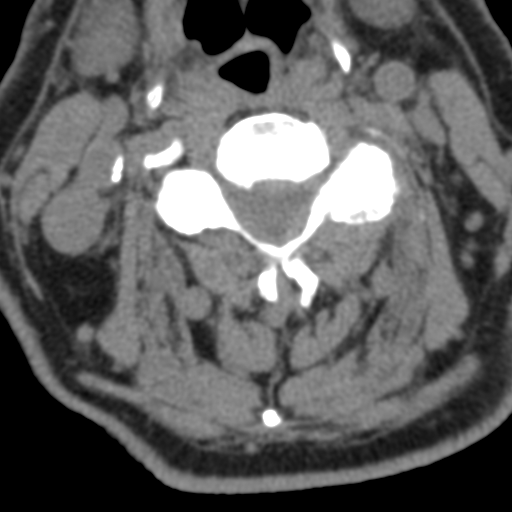
[im 66/99  soft-tissue]
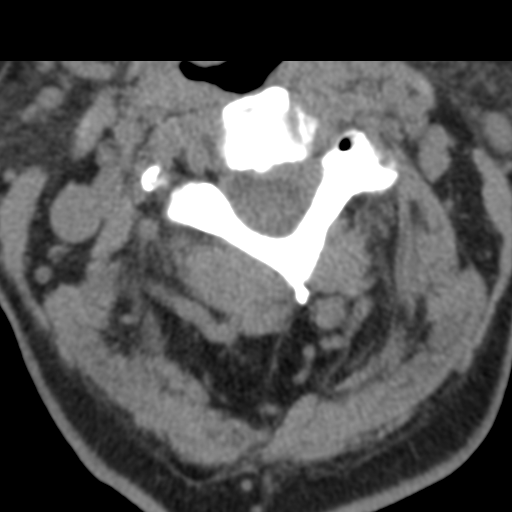
[im 82/99  soft-tissue]
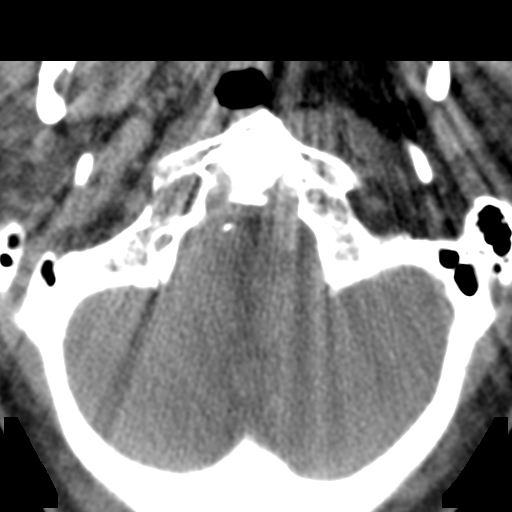

[Series 8: angled axial · axial · 0.23mm/px · z∈[-231,-105]mm · 5 of 99 slices shown, 7 images]
[im 17/99  soft-tissue]
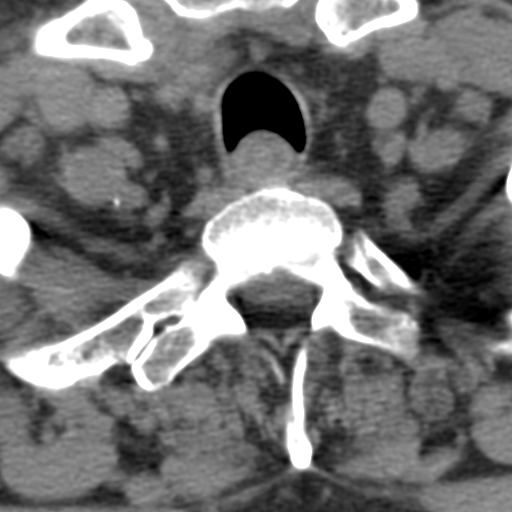
[im 17/99  bone]
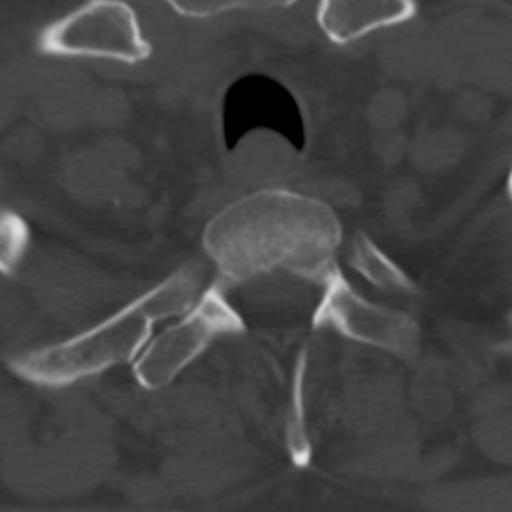
[im 33/99  bone]
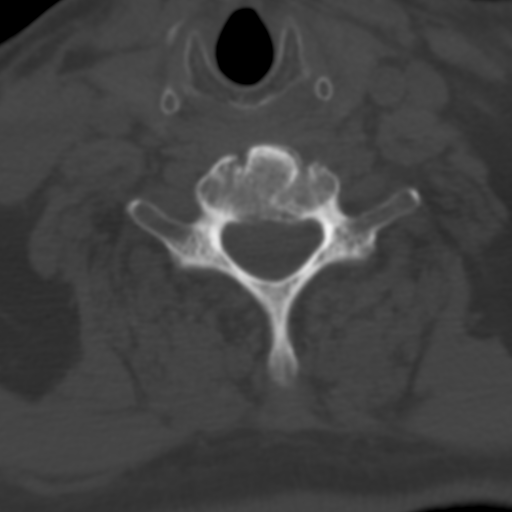
[im 50/99  bone]
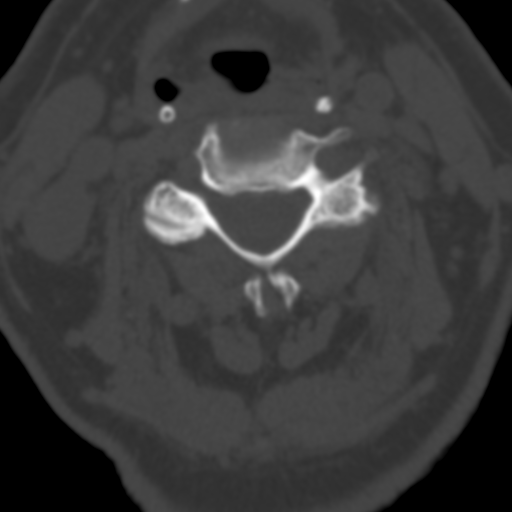
[im 66/99  bone]
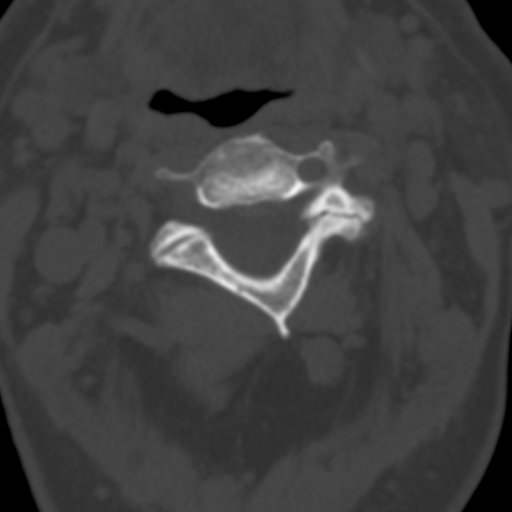
[im 82/99  soft-tissue]
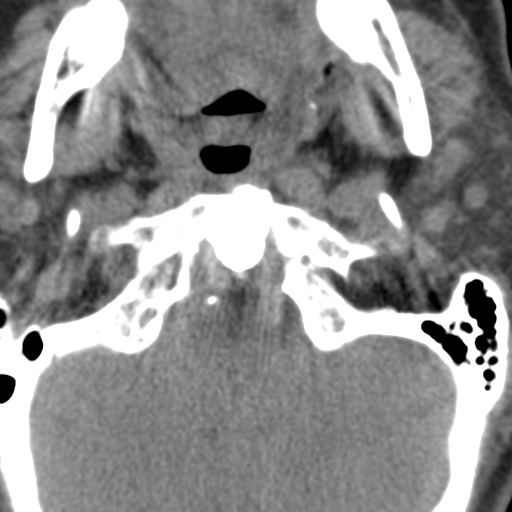
[im 82/99  bone]
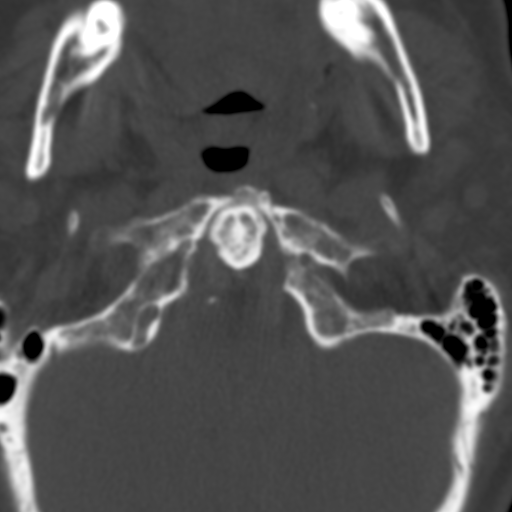

[10 of 14 positions shown; findings below may reference images not displayed]

FINDINGS: Alignment: Slight C5-6 retrolisthesis.

Skull base and vertebrae: No acute fracture. No primary bone lesion
or focal pathologic process. Prominent lucency in the T3 posterior
body is likely venous plexus or hemangioma.

Soft tissues and spinal canal: No prevertebral fluid or swelling. No
visible canal hematoma.

Disc levels:

Craniocervical junction: Non segmentation of the atlanto occipital
joints with hyper trophic degenerative changes at the atlantodental
interval.

C2-3: Facet arthropathy with spurring greater on the left. Mild left
foraminal stenosis. Patent canal

C3-4: Facet arthropathy with spurring greater on the left. Mild disc
narrowing. Mild left foraminal narrowing.

C4-5: Facet arthropathy with asymmetric left hypertrophy. Mild
uncovertebral ridging. Mild bilateral foraminal narrowing. No
evidence of canal stenosis.

C5-6: Degenerative disc narrowing with endplate and uncovertebral
ridging. Mild facet arthropathy. Ventral subarachnoid space is
effaced, overall mild canal stenosis. Moderate bilateral foraminal
stenosis from spurring.

C6-7: Discectomy with solid bony fusion. No evidence of residual
stenosis.

C7-T1:Mild facet spurring.  No evidence of impingement.

Upper chest: Negative

Other: Negative
IMPRESSION: 1. Cervical disc and facet degeneration with up to moderate
foraminal stenosis bilaterally at C5-6.
2. C6-7 discectomy with solid bony fusion.

## 2016-10-25 DIAGNOSIS — M79671 Pain in right foot: Secondary | ICD-10-CM | POA: Diagnosis not present

## 2016-11-08 DIAGNOSIS — M109 Gout, unspecified: Secondary | ICD-10-CM | POA: Diagnosis not present

## 2016-11-17 DIAGNOSIS — G894 Chronic pain syndrome: Secondary | ICD-10-CM | POA: Diagnosis not present

## 2016-11-17 DIAGNOSIS — M961 Postlaminectomy syndrome, not elsewhere classified: Secondary | ICD-10-CM | POA: Diagnosis not present

## 2016-11-17 DIAGNOSIS — M47817 Spondylosis without myelopathy or radiculopathy, lumbosacral region: Secondary | ICD-10-CM | POA: Diagnosis not present

## 2016-11-17 DIAGNOSIS — M7551 Bursitis of right shoulder: Secondary | ICD-10-CM | POA: Diagnosis not present

## 2016-11-17 DIAGNOSIS — M47812 Spondylosis without myelopathy or radiculopathy, cervical region: Secondary | ICD-10-CM | POA: Diagnosis not present

## 2016-12-20 DIAGNOSIS — M47812 Spondylosis without myelopathy or radiculopathy, cervical region: Secondary | ICD-10-CM | POA: Diagnosis not present

## 2016-12-20 DIAGNOSIS — G894 Chronic pain syndrome: Secondary | ICD-10-CM | POA: Diagnosis not present

## 2016-12-20 DIAGNOSIS — Z79891 Long term (current) use of opiate analgesic: Secondary | ICD-10-CM | POA: Diagnosis not present

## 2016-12-20 DIAGNOSIS — M961 Postlaminectomy syndrome, not elsewhere classified: Secondary | ICD-10-CM | POA: Diagnosis not present

## 2016-12-20 DIAGNOSIS — M47817 Spondylosis without myelopathy or radiculopathy, lumbosacral region: Secondary | ICD-10-CM | POA: Diagnosis not present

## 2016-12-21 DIAGNOSIS — M722 Plantar fascial fibromatosis: Secondary | ICD-10-CM | POA: Diagnosis not present

## 2017-01-05 DIAGNOSIS — M5412 Radiculopathy, cervical region: Secondary | ICD-10-CM | POA: Diagnosis not present

## 2017-01-05 DIAGNOSIS — G5601 Carpal tunnel syndrome, right upper limb: Secondary | ICD-10-CM | POA: Diagnosis not present

## 2017-01-19 DIAGNOSIS — M47812 Spondylosis without myelopathy or radiculopathy, cervical region: Secondary | ICD-10-CM | POA: Diagnosis not present

## 2017-01-19 DIAGNOSIS — G894 Chronic pain syndrome: Secondary | ICD-10-CM | POA: Diagnosis not present

## 2017-01-19 DIAGNOSIS — M47817 Spondylosis without myelopathy or radiculopathy, lumbosacral region: Secondary | ICD-10-CM | POA: Diagnosis not present

## 2017-01-19 DIAGNOSIS — M961 Postlaminectomy syndrome, not elsewhere classified: Secondary | ICD-10-CM | POA: Diagnosis not present

## 2017-02-14 DIAGNOSIS — Z0000 Encounter for general adult medical examination without abnormal findings: Secondary | ICD-10-CM | POA: Diagnosis not present

## 2017-02-14 DIAGNOSIS — R1013 Epigastric pain: Secondary | ICD-10-CM | POA: Diagnosis not present

## 2017-02-14 DIAGNOSIS — Z23 Encounter for immunization: Secondary | ICD-10-CM | POA: Diagnosis not present

## 2017-02-14 DIAGNOSIS — I10 Essential (primary) hypertension: Secondary | ICD-10-CM | POA: Diagnosis not present

## 2017-02-15 DIAGNOSIS — M47817 Spondylosis without myelopathy or radiculopathy, lumbosacral region: Secondary | ICD-10-CM | POA: Diagnosis not present

## 2017-02-15 DIAGNOSIS — G894 Chronic pain syndrome: Secondary | ICD-10-CM | POA: Diagnosis not present

## 2017-02-15 DIAGNOSIS — M47812 Spondylosis without myelopathy or radiculopathy, cervical region: Secondary | ICD-10-CM | POA: Diagnosis not present

## 2017-02-15 DIAGNOSIS — M961 Postlaminectomy syndrome, not elsewhere classified: Secondary | ICD-10-CM | POA: Diagnosis not present

## 2017-03-14 DIAGNOSIS — M961 Postlaminectomy syndrome, not elsewhere classified: Secondary | ICD-10-CM | POA: Diagnosis not present

## 2017-03-14 DIAGNOSIS — M47817 Spondylosis without myelopathy or radiculopathy, lumbosacral region: Secondary | ICD-10-CM | POA: Diagnosis not present

## 2017-03-14 DIAGNOSIS — G894 Chronic pain syndrome: Secondary | ICD-10-CM | POA: Diagnosis not present

## 2017-03-14 DIAGNOSIS — M47812 Spondylosis without myelopathy or radiculopathy, cervical region: Secondary | ICD-10-CM | POA: Diagnosis not present

## 2017-03-29 DIAGNOSIS — H52203 Unspecified astigmatism, bilateral: Secondary | ICD-10-CM | POA: Diagnosis not present

## 2017-03-29 DIAGNOSIS — H25013 Cortical age-related cataract, bilateral: Secondary | ICD-10-CM | POA: Diagnosis not present

## 2017-03-29 DIAGNOSIS — H5203 Hypermetropia, bilateral: Secondary | ICD-10-CM | POA: Diagnosis not present

## 2017-03-29 DIAGNOSIS — H2513 Age-related nuclear cataract, bilateral: Secondary | ICD-10-CM | POA: Diagnosis not present

## 2017-03-29 DIAGNOSIS — H524 Presbyopia: Secondary | ICD-10-CM | POA: Diagnosis not present

## 2017-04-02 DIAGNOSIS — G4733 Obstructive sleep apnea (adult) (pediatric): Secondary | ICD-10-CM | POA: Diagnosis not present

## 2017-04-18 DIAGNOSIS — M961 Postlaminectomy syndrome, not elsewhere classified: Secondary | ICD-10-CM | POA: Diagnosis not present

## 2017-04-18 DIAGNOSIS — G894 Chronic pain syndrome: Secondary | ICD-10-CM | POA: Diagnosis not present

## 2017-04-18 DIAGNOSIS — M47812 Spondylosis without myelopathy or radiculopathy, cervical region: Secondary | ICD-10-CM | POA: Diagnosis not present

## 2017-04-18 DIAGNOSIS — M47817 Spondylosis without myelopathy or radiculopathy, lumbosacral region: Secondary | ICD-10-CM | POA: Diagnosis not present

## 2017-04-20 ENCOUNTER — Other Ambulatory Visit: Payer: Self-pay | Admitting: Physical Medicine and Rehabilitation

## 2017-04-20 DIAGNOSIS — M79604 Pain in right leg: Secondary | ICD-10-CM

## 2017-04-25 ENCOUNTER — Ambulatory Visit
Admission: RE | Admit: 2017-04-25 | Discharge: 2017-04-25 | Disposition: A | Discharge status: Home / Self Care | Payer: Medicare Other | Source: Ambulatory Visit | Attending: Physical Medicine and Rehabilitation | Admitting: Physical Medicine and Rehabilitation

## 2017-04-25 DIAGNOSIS — M79604 Pain in right leg: Secondary | ICD-10-CM

## 2017-04-25 DIAGNOSIS — M545 Low back pain: Secondary | ICD-10-CM | POA: Diagnosis not present

## 2017-05-21 DIAGNOSIS — M961 Postlaminectomy syndrome, not elsewhere classified: Secondary | ICD-10-CM | POA: Diagnosis not present

## 2017-05-21 DIAGNOSIS — M47817 Spondylosis without myelopathy or radiculopathy, lumbosacral region: Secondary | ICD-10-CM | POA: Diagnosis not present

## 2017-05-21 DIAGNOSIS — G894 Chronic pain syndrome: Secondary | ICD-10-CM | POA: Diagnosis not present

## 2017-05-21 DIAGNOSIS — M47812 Spondylosis without myelopathy or radiculopathy, cervical region: Secondary | ICD-10-CM | POA: Diagnosis not present

## 2017-06-18 DIAGNOSIS — M47812 Spondylosis without myelopathy or radiculopathy, cervical region: Secondary | ICD-10-CM | POA: Diagnosis not present

## 2017-06-18 DIAGNOSIS — G894 Chronic pain syndrome: Secondary | ICD-10-CM | POA: Diagnosis not present

## 2017-06-18 DIAGNOSIS — M47817 Spondylosis without myelopathy or radiculopathy, lumbosacral region: Secondary | ICD-10-CM | POA: Diagnosis not present

## 2017-06-18 DIAGNOSIS — M961 Postlaminectomy syndrome, not elsewhere classified: Secondary | ICD-10-CM | POA: Diagnosis not present

## 2017-07-16 DIAGNOSIS — G4733 Obstructive sleep apnea (adult) (pediatric): Secondary | ICD-10-CM | POA: Diagnosis not present

## 2017-07-17 DIAGNOSIS — M47812 Spondylosis without myelopathy or radiculopathy, cervical region: Secondary | ICD-10-CM | POA: Diagnosis not present

## 2017-07-17 DIAGNOSIS — M47817 Spondylosis without myelopathy or radiculopathy, lumbosacral region: Secondary | ICD-10-CM | POA: Diagnosis not present

## 2017-07-17 DIAGNOSIS — M961 Postlaminectomy syndrome, not elsewhere classified: Secondary | ICD-10-CM | POA: Diagnosis not present

## 2017-07-17 DIAGNOSIS — G894 Chronic pain syndrome: Secondary | ICD-10-CM | POA: Diagnosis not present

## 2017-08-17 DIAGNOSIS — M47812 Spondylosis without myelopathy or radiculopathy, cervical region: Secondary | ICD-10-CM | POA: Diagnosis not present

## 2017-08-17 DIAGNOSIS — M47817 Spondylosis without myelopathy or radiculopathy, lumbosacral region: Secondary | ICD-10-CM | POA: Diagnosis not present

## 2017-08-17 DIAGNOSIS — Z79891 Long term (current) use of opiate analgesic: Secondary | ICD-10-CM | POA: Diagnosis not present

## 2017-08-17 DIAGNOSIS — G894 Chronic pain syndrome: Secondary | ICD-10-CM | POA: Diagnosis not present

## 2017-08-17 DIAGNOSIS — M961 Postlaminectomy syndrome, not elsewhere classified: Secondary | ICD-10-CM | POA: Diagnosis not present

## 2017-09-14 DIAGNOSIS — G894 Chronic pain syndrome: Secondary | ICD-10-CM | POA: Diagnosis not present

## 2017-09-14 DIAGNOSIS — M47812 Spondylosis without myelopathy or radiculopathy, cervical region: Secondary | ICD-10-CM | POA: Diagnosis not present

## 2017-09-14 DIAGNOSIS — M47817 Spondylosis without myelopathy or radiculopathy, lumbosacral region: Secondary | ICD-10-CM | POA: Diagnosis not present

## 2017-09-14 DIAGNOSIS — M961 Postlaminectomy syndrome, not elsewhere classified: Secondary | ICD-10-CM | POA: Diagnosis not present

## 2017-09-21 DIAGNOSIS — E782 Mixed hyperlipidemia: Secondary | ICD-10-CM | POA: Diagnosis not present

## 2017-09-21 DIAGNOSIS — N400 Enlarged prostate without lower urinary tract symptoms: Secondary | ICD-10-CM | POA: Diagnosis not present

## 2017-09-21 DIAGNOSIS — R972 Elevated prostate specific antigen [PSA]: Secondary | ICD-10-CM | POA: Diagnosis not present

## 2017-09-21 DIAGNOSIS — N182 Chronic kidney disease, stage 2 (mild): Secondary | ICD-10-CM | POA: Diagnosis not present

## 2017-10-17 DIAGNOSIS — M961 Postlaminectomy syndrome, not elsewhere classified: Secondary | ICD-10-CM | POA: Diagnosis not present

## 2017-10-17 DIAGNOSIS — M47812 Spondylosis without myelopathy or radiculopathy, cervical region: Secondary | ICD-10-CM | POA: Diagnosis not present

## 2017-10-17 DIAGNOSIS — M47817 Spondylosis without myelopathy or radiculopathy, lumbosacral region: Secondary | ICD-10-CM | POA: Diagnosis not present

## 2017-10-17 DIAGNOSIS — G894 Chronic pain syndrome: Secondary | ICD-10-CM | POA: Diagnosis not present

## 2017-11-05 DIAGNOSIS — G4733 Obstructive sleep apnea (adult) (pediatric): Secondary | ICD-10-CM | POA: Diagnosis not present

## 2017-11-16 DIAGNOSIS — M47812 Spondylosis without myelopathy or radiculopathy, cervical region: Secondary | ICD-10-CM | POA: Diagnosis not present

## 2017-11-16 DIAGNOSIS — M961 Postlaminectomy syndrome, not elsewhere classified: Secondary | ICD-10-CM | POA: Diagnosis not present

## 2017-11-16 DIAGNOSIS — M47817 Spondylosis without myelopathy or radiculopathy, lumbosacral region: Secondary | ICD-10-CM | POA: Diagnosis not present

## 2017-11-16 DIAGNOSIS — G894 Chronic pain syndrome: Secondary | ICD-10-CM | POA: Diagnosis not present

## 2017-11-28 DIAGNOSIS — L820 Inflamed seborrheic keratosis: Secondary | ICD-10-CM | POA: Diagnosis not present

## 2017-11-28 DIAGNOSIS — Z1331 Encounter for screening for depression: Secondary | ICD-10-CM | POA: Diagnosis not present

## 2017-11-28 DIAGNOSIS — R079 Chest pain, unspecified: Secondary | ICD-10-CM | POA: Diagnosis not present

## 2017-12-12 ENCOUNTER — Encounter: Payer: Self-pay | Admitting: Cardiology

## 2017-12-17 DIAGNOSIS — M47812 Spondylosis without myelopathy or radiculopathy, cervical region: Secondary | ICD-10-CM | POA: Diagnosis not present

## 2017-12-17 DIAGNOSIS — M47817 Spondylosis without myelopathy or radiculopathy, lumbosacral region: Secondary | ICD-10-CM | POA: Diagnosis not present

## 2017-12-17 DIAGNOSIS — M961 Postlaminectomy syndrome, not elsewhere classified: Secondary | ICD-10-CM | POA: Diagnosis not present

## 2017-12-17 DIAGNOSIS — G894 Chronic pain syndrome: Secondary | ICD-10-CM | POA: Diagnosis not present

## 2017-12-21 DIAGNOSIS — R079 Chest pain, unspecified: Secondary | ICD-10-CM | POA: Diagnosis not present

## 2017-12-21 DIAGNOSIS — R0789 Other chest pain: Secondary | ICD-10-CM | POA: Diagnosis not present

## 2017-12-25 ENCOUNTER — Ambulatory Visit: Payer: Medicare Other | Admitting: Cardiology

## 2017-12-25 ENCOUNTER — Encounter: Payer: Self-pay | Admitting: Cardiology

## 2017-12-25 VITALS — BP 167/82 | HR 68 | Ht 66.0 in | Wt 216.4 lb

## 2017-12-25 DIAGNOSIS — G4733 Obstructive sleep apnea (adult) (pediatric): Secondary | ICD-10-CM

## 2017-12-25 DIAGNOSIS — I10 Essential (primary) hypertension: Secondary | ICD-10-CM | POA: Diagnosis not present

## 2017-12-25 DIAGNOSIS — G894 Chronic pain syndrome: Secondary | ICD-10-CM | POA: Diagnosis not present

## 2017-12-25 DIAGNOSIS — R079 Chest pain, unspecified: Secondary | ICD-10-CM

## 2017-12-25 DIAGNOSIS — R0789 Other chest pain: Secondary | ICD-10-CM | POA: Diagnosis not present

## 2017-12-25 DIAGNOSIS — E785 Hyperlipidemia, unspecified: Secondary | ICD-10-CM | POA: Diagnosis not present

## 2017-12-25 LAB — BASIC METABOLIC PANEL
BUN/Creatinine Ratio: 14 (ref 10–24)
BUN: 16 mg/dL (ref 8–27)
CALCIUM: 9.3 mg/dL (ref 8.6–10.2)
CHLORIDE: 100 mmol/L (ref 96–106)
CO2: 24 mmol/L (ref 20–29)
Creatinine, Ser: 1.16 mg/dL (ref 0.76–1.27)
GFR calc Af Amer: 69 mL/min/{1.73_m2} (ref >59)
GFR calc non Af Amer: 60 mL/min/{1.73_m2} (ref >59)
Glucose: 105 mg/dL — ABNORMAL HIGH (ref 65–99)
POTASSIUM: 4.3 mmol/L (ref 3.5–5.2)
Sodium: 139 mmol/L (ref 134–144)

## 2017-12-25 NOTE — Progress Notes (Signed)
Cardiology Consultation:    Date:  12/25/2017   ID:  Ronald [name], DOB July 06, 1940, MRN [id number]  PCP:  Mateo Flow, MD  Cardiologist:  Jenne Campus, MD   Referring MD: Mateo Flow, MD   No chief complaint on file. I have a chest pain  History of Present Illness:    Ronald [name] is a 77 y.o. male who is being seen today for the evaluation of chest pain at the request of Mateo Flow, MD.  His past medical history significant for hypertension, sleep apnea, chronic pain syndrome.  For about a year he experienced pain which he localizes between his scapulas.  Usually pain starts over there and then goes to the neck and then move towards the front.  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  He have to take aspirin and lay down and that usually relieve the pain.  He does note of exertional tightness squeezing pressure burning chest.  He just had a stress test done few days ago stress test was good quality negative for exercise-induced myocardial ischemia. Overall he is doing well he is not does not exercise on the regular basis.  He is a risk factor for coronary artery disease include hypertension family history of coronary artery disease as well as dyslipidemia  Past Medical History:  Diagnosis Date  . Allergic rhinitis   . Anxiety   . BPH (benign prostatic hyperplasia)   . Chronic pain syndrome   . Hypertension   . Lumbar radicular pain   . Major depressive disorder   . Mixed hyperlipidemia   . Neuropathy   . Sleep apnea     Past Surgical History:  Procedure Laterality Date  . BACK SURGERY    . CERVICAL FUSION    . colonoscopy     reported as normal  . LUMBAR NERVE STIMLATOR INSERTION    . THYROIDECTOMY      Current Medications: Current Meds  Medication Sig  . amLODipine (NORVASC) 5 MG tablet Take 5 mg by mouth daily.  Marland Kitchen aspirin EC 81 MG EC tablet Take 1 tablet (81 mg total) by  mouth daily.  . DULoxetine (CYMBALTA) 60 MG capsule Take 60 mg by mouth daily.  . furosemide (LASIX) 20 MG tablet Take 1 tablet by mouth daily.  Marland Kitchen gabapentin (NEURONTIN) 300 MG capsule Take 300 mg by mouth 3 (three) times daily.  Marland Kitchen losartan (COZAAR) 25 MG tablet Take 1 tablet (25 mg total) by mouth daily.  Marland Kitchen oxyCODONE (ROXICODONE) 15 MG immediate release tablet Take 15 mg by mouth every 4 (four) hours as needed for pain. Up to 7 tablets in a day.  Marland Kitchen rOPINIRole (REQUIP) 0.5 MG tablet Take 1 mg by mouth at bedtime.  . tamsulosin (FLOMAX) 0.4 MG CAPS Take 0.4 mg by mouth daily.     Allergies:   Phenobarbital   Social History   Socioeconomic History  . Marital status: Married    Spouse name: Not on file  . Number of children: Not on file  . Years of education: Not on file  . Highest education level: Not on file  Occupational History  . Occupation: Theme park manager    Comment: [name] of a [employer] for over 40 years  Social Needs  . Financial resource strain: Not on file  . Food insecurity:    Worry: Not on file    Inability: Not on file  .  Transportation needs:    Medical: Not on file    Non-medical: Not on file  Tobacco Use  . Smoking status: Never Smoker  . Smokeless tobacco: Never Used  Substance and Sexual Activity  . Alcohol use: No  . Drug use: No  . Sexual activity: Yes    Birth control/protection: None  Lifestyle  . Physical activity:    Days per week: Not on file    Minutes per session: Not on file  . Stress: Not on file  Relationships  . Social connections:    Talks on phone: Not on file    Gets together: Not on file    Attends religious service: Not on file    Active member of club or organization: Not on file    Attends meetings of clubs or organizations: Not on file    Relationship status: Not on file  Other Topics Concern  . Not on file  Social History Narrative   [name] at [employer] - lives in [place]   Lives with Wife and      Family History: The  patient's family history includes Coronary artery disease (age of onset: 27) in his father; Diabetes in his father; Hypertension in his father. ROS:   Please see the history of present illness.    All 14 point review of systems negative except as described per history of present illness.  EKGs/Labs/Other Studies Reviewed:    The following studies were reviewed today: Stress test done in the hospital on 12/20/2017 showed no reversible ischemia or infarction ejection fraction 63%  EKG:  EKG is  ordered today.  The ekg ordered today demonstrates normal sinus rhythm normal P interval normal QS complex duration morphology no ST-T segment changes  Recent Labs: No results found for requested labs within last 8760 hours.  Recent Lipid Panel    Component Value Date/Time   CHOL 216 (H) 11/06/2012 0520   TRIG 138 11/06/2012 0520   HDL 48 11/06/2012 0520   CHOLHDL 4.5 11/06/2012 0520   VLDL 28 11/06/2012 0520   LDLCALC 140 (H) 11/06/2012 0520    Physical Exam:    VS:  BP (!) 167/82 (BP Location: Right Arm, Patient Position: Sitting, Cuff Size: Normal)   Pulse 68   Ht 5\' 6"  (1.676 m)   Wt 216 lb 6.4 oz (98.2 kg)   SpO2 97%   BMI 34.93 kg/m     Wt Readings from Last 3 Encounters:  12/25/17 216 lb 6.4 oz (98.2 kg)  11/02/15 214 lb 8 oz (97.3 kg)  11/05/12 185 lb 11.2 oz (84.2 kg)     GEN:  Well nourished, well developed in no acute distress HEENT: Normal NECK: No JVD; No carotid bruits LYMPHATICS: No lymphadenopathy CARDIAC: RRR, no murmurs, no rubs, no gallops RESPIRATORY:  Clear to auscultation without rales, wheezing or rhonchi  ABDOMEN: Soft, non-tender, non-distended MUSCULOSKELETAL:  No edema; No deformity  SKIN: Warm and dry NEUROLOGIC:  Alert and oriented x 3 PSYCHIATRIC:  Normal affect   ASSESSMENT:    1. Chronic pain syndrome   2. Atypical chest pain   3. Dyslipidemia   4. Essential hypertension   5. Obstructive sleep apnea    PLAN:    In order of problems  listed above:  1. Atypical chest pain.  Stress test negative.  I am worried about potentially having some chest pathology.  I have no good explanation for his symptoms he did try some antiacid with no relief I will suggest to do  CT of his chest if his kidney function is normal.  And that should be done with contrast so we can look at his aorta.  More about the fact the pain radiates towards the back.  I doubt coronary artery disease is a significant problem so far testing is negative stress test was negative.  Pain is atypical.  Still I think in order is to modify his risk of active coronary artery disease. 2. Dyslipidemia: We will call primary care physician to get his fasting lipid profile. 3. Essential hypertension blood pressure well controlled continue present management. 4. Obstructive sleep apnea using mask on the regular basis very happy and satisfied with it I will suggest to do echocardiogram to check left ventricular ejection fraction and more importantly right ventricle size and function.   Medication Adjustments/Labs and Tests Ordered: Current medicines are reviewed at length with the patient today.  Concerns regarding medicines are outlined above.  No orders of the defined types were placed in this encounter.  No orders of the defined types were placed in this encounter.   Signed, Park Liter, MD, [facility]. 12/25/2017 9:03 AM    [place]

## 2017-12-25 NOTE — Patient Instructions (Signed)
Medication Instructions:  Your physician recommends that you continue on your current medications as directed. Please refer to the Current Medication list given to you today.  Labwork: Your physician recommends that you have the following labs drawn: BMP  Testing/Procedures: Your physician has requested that you have an echocardiogram. Echocardiography is a painless test that uses sound waves to create images of your heart. It provides your doctor with information about the size and shape of your heart and how well your heart's chambers and valves are working. This procedure takes approximately one hour. There are no restrictions for this procedure.  Non-Cardiac CT scanning, (CAT scanning), is a noninvasive, special x-ray that produces cross-sectional images of the body using x-rays and a computer. CT scans help physicians diagnose and treat medical conditions. For some CT exams, a contrast material is used to enhance visibility in the area of the body being studied. CT scans provide greater clarity and reveal more details than regular x-ray exams.  Follow-Up: Your physician recommends that you schedule a follow-up appointment in: 1 month  Any Other Special Instructions Will Be Listed Below (If Applicable).     If you need a refill on your cardiac medications before your next appointment, please call your pharmacy.   [place], RN, BSN

## 2017-12-27 DIAGNOSIS — R079 Chest pain, unspecified: Secondary | ICD-10-CM | POA: Diagnosis not present

## 2017-12-27 DIAGNOSIS — I2510 Atherosclerotic heart disease of native coronary artery without angina pectoris: Secondary | ICD-10-CM | POA: Diagnosis not present

## 2017-12-27 DIAGNOSIS — I517 Cardiomegaly: Secondary | ICD-10-CM | POA: Diagnosis not present

## 2017-12-27 DIAGNOSIS — R0789 Other chest pain: Secondary | ICD-10-CM | POA: Diagnosis not present

## 2017-12-28 ENCOUNTER — Telehealth: Payer: Self-pay

## 2017-12-28 NOTE — Telephone Encounter (Signed)
Informed patient of lexi results; informed to call the office with any further questions or concerns.  

## 2018-01-21 DIAGNOSIS — M47812 Spondylosis without myelopathy or radiculopathy, cervical region: Secondary | ICD-10-CM | POA: Diagnosis not present

## 2018-01-21 DIAGNOSIS — G894 Chronic pain syndrome: Secondary | ICD-10-CM | POA: Diagnosis not present

## 2018-01-21 DIAGNOSIS — M47817 Spondylosis without myelopathy or radiculopathy, lumbosacral region: Secondary | ICD-10-CM | POA: Diagnosis not present

## 2018-01-21 DIAGNOSIS — M961 Postlaminectomy syndrome, not elsewhere classified: Secondary | ICD-10-CM | POA: Diagnosis not present

## 2018-02-04 ENCOUNTER — Other Ambulatory Visit: Payer: Self-pay

## 2018-02-04 ENCOUNTER — Ambulatory Visit (INDEPENDENT_AMBULATORY_CARE_PROVIDER_SITE_OTHER): Payer: Medicare Other

## 2018-02-04 DIAGNOSIS — G894 Chronic pain syndrome: Secondary | ICD-10-CM

## 2018-02-04 DIAGNOSIS — R0789 Other chest pain: Secondary | ICD-10-CM

## 2018-02-04 NOTE — Progress Notes (Signed)
Complete echocardiogram has been performed.  Jimmy Tal Neer RDCS, RVT 

## 2018-02-11 ENCOUNTER — Ambulatory Visit (INDEPENDENT_AMBULATORY_CARE_PROVIDER_SITE_OTHER): Payer: Medicare Other | Admitting: Cardiology

## 2018-02-11 ENCOUNTER — Encounter: Payer: Self-pay | Admitting: Cardiology

## 2018-02-11 VITALS — BP 140/64 | HR 68 | Ht 66.5 in | Wt 215.0 lb

## 2018-02-11 DIAGNOSIS — E785 Hyperlipidemia, unspecified: Secondary | ICD-10-CM | POA: Diagnosis not present

## 2018-02-11 DIAGNOSIS — R0789 Other chest pain: Secondary | ICD-10-CM

## 2018-02-11 DIAGNOSIS — I10 Essential (primary) hypertension: Secondary | ICD-10-CM | POA: Diagnosis not present

## 2018-02-11 DIAGNOSIS — G894 Chronic pain syndrome: Secondary | ICD-10-CM | POA: Diagnosis not present

## 2018-02-11 NOTE — Progress Notes (Signed)
Cardiology Office Note:    Date:  02/11/2018   ID:  Ronald [name], DOB 1941-03-10, MRN [id number]  PCP:  Mateo Flow, MD  Cardiologist:  Jenne Campus, MD    Referring MD: Mateo Flow, MD   Chief Complaint  Patient presents with  . 1 month follow up  Doing well denies having any problems  History of Present Illness:    Ronald [name] is a 77 y.o. male with history of atypical chest pain.  So far cardiac work-up negative.  Also dyslipidemia hypertension he supposed to have a CT of his chest to look for any aortic pathology however he did not have it done and he does not want to have it done right now since he is asymptomatic.  Past Medical History:  Diagnosis Date  . Allergic rhinitis   . Anxiety   . BPH (benign prostatic hyperplasia)   . Chronic pain syndrome   . Hypertension   . Lumbar radicular pain   . Major depressive disorder   . Mixed hyperlipidemia   . Neuropathy   . Sleep apnea     Past Surgical History:  Procedure Laterality Date  . BACK SURGERY    . CERVICAL FUSION    . colonoscopy     reported as normal  . LUMBAR NERVE STIMLATOR INSERTION    . THYROIDECTOMY      Current Medications: Current Meds  Medication Sig  . amLODipine (NORVASC) 5 MG tablet Take 5 mg by mouth daily.  Marland Kitchen aspirin EC 81 MG EC tablet Take 1 tablet (81 mg total) by mouth daily.  . DULoxetine (CYMBALTA) 60 MG capsule Take 60 mg by mouth daily.  . furosemide (LASIX) 20 MG tablet Take 1 tablet by mouth daily.  Marland Kitchen gabapentin (NEURONTIN) 300 MG capsule Take 300 mg by mouth 3 (three) times daily.  Marland Kitchen losartan (COZAAR) 25 MG tablet Take 1 tablet (25 mg total) by mouth daily.  Marland Kitchen oxyCODONE (ROXICODONE) 15 MG immediate release tablet Take 15 mg by mouth every 4 (four) hours as needed for pain. Up to 7 tablets in a day.  Marland Kitchen rOPINIRole (REQUIP) 0.5 MG tablet Take 1 mg by mouth at bedtime.  . tamsulosin (FLOMAX) 0.4 MG CAPS Take 0.4 mg by mouth daily.     Allergies:   Phenobarbital    Social History   Socioeconomic History  . Marital status: Married    Spouse name: Not on file  . Number of children: Not on file  . Years of education: Not on file  . Highest education level: Not on file  Occupational History  . Occupation: Theme park manager    Comment: [name] of a [employer] for over 40 years  Social Needs  . Financial resource strain: Not on file  . Food insecurity:    Worry: Not on file    Inability: Not on file  . Transportation needs:    Medical: Not on file    Non-medical: Not on file  Tobacco Use  . Smoking status: Never Smoker  . Smokeless tobacco: Never Used  Substance and Sexual Activity  . Alcohol use: No  . Drug use: No  . Sexual activity: Yes    Birth control/protection: None  Lifestyle  . Physical activity:    Days per week: Not on file    Minutes per session: Not on file  . Stress: Not on file  Relationships  . Social connections:    Talks on phone: Not on file  Gets together: Not on file    Attends religious service: Not on file    Active member of club or organization: Not on file    Attends meetings of clubs or organizations: Not on file    Relationship status: Not on file  Other Topics Concern  . Not on file  Social History Narrative   Ronald [name] at [employer] - lives in [place]   Lives with Wife and      Family History: The patient's family history includes Coronary artery disease (age of onset: 3) in his father; Diabetes in his father; Hypertension in his father. ROS:   Please see the history of present illness.    All 14 point review of systems negative except as described per history of present illness  EKGs/Labs/Other Studies Reviewed:      Recent Labs: 12/25/2017: BUN 16; Creatinine, Ser 1.16; Potassium 4.3; Sodium 139  Recent Lipid Panel    Component Value Date/Time   CHOL 216 (H) 11/06/2012 0520   TRIG 138 11/06/2012 0520   HDL 48 11/06/2012 0520   CHOLHDL 4.5 11/06/2012 0520   VLDL 28 11/06/2012 0520   LDLCALC  140 (H) 11/06/2012 0520    Physical Exam:    VS:  BP 140/64   Pulse 68   Ht 5' 6.5" (1.689 m)   Wt 215 lb (97.5 kg)   SpO2 95%   BMI 34.18 kg/m     Wt Readings from Last 3 Encounters:  02/11/18 215 lb (97.5 kg)  12/25/17 216 lb 6.4 oz (98.2 kg)  11/02/15 214 lb 8 oz (97.3 kg)     GEN:  Well nourished, well developed in no acute distress HEENT: Normal NECK: No JVD; No carotid bruits LYMPHATICS: No lymphadenopathy CARDIAC: RRR, no murmurs, no rubs, no gallops RESPIRATORY:  Clear to auscultation without rales, wheezing or rhonchi  ABDOMEN: Soft, non-tender, non-distended MUSCULOSKELETAL:  No edema; No deformity  SKIN: Warm and dry LOWER EXTREMITIES: no swelling NEUROLOGIC:  Alert and oriented x 3 PSYCHIATRIC:  Normal affect   ASSESSMENT:    1. Essential hypertension   2. Atypical chest pain   3. Chronic pain syndrome   4. Dyslipidemia    PLAN:    In order of problems listed above:  1. Essential hypertension blood pressure appears to be controlled we will continue present management. 2. Atypical chest pain with cardiac work-up negative.  He supposed to have a CT however did not do it and does not want to do it now. 3. Chronic pain syndrome managed by internal medicine team. 4. Dyslipidemia stable   Medication Adjustments/Labs and Tests Ordered: Current medicines are reviewed at length with the patient today.  Concerns regarding medicines are outlined above.  No orders of the defined types were placed in this encounter.  Medication changes: No orders of the defined types were placed in this encounter.   Signed, Park Liter, MD, [facility] 02/11/2018 9:01 AM    [place]

## 2018-02-11 NOTE — Patient Instructions (Signed)
Medication Instructions:  Your physician recommends that you continue on your current medications as directed. Please refer to the Current Medication list given to you today.  Labwork: None  Testing/Procedures: None  Follow-Up: Your physician wants you to follow-up in: 5 months. You will receive a reminder letter in the mail two months in advance. If you don't receive a letter, please call our office to schedule the follow-up appointment.  Any Other Special Instructions Will Be Listed Below (If Applicable).     If you need a refill on your cardiac medications before your next appointment, please call your pharmacy.   

## 2018-02-18 DIAGNOSIS — G894 Chronic pain syndrome: Secondary | ICD-10-CM | POA: Diagnosis not present

## 2018-02-18 DIAGNOSIS — M47817 Spondylosis without myelopathy or radiculopathy, lumbosacral region: Secondary | ICD-10-CM | POA: Diagnosis not present

## 2018-02-18 DIAGNOSIS — M961 Postlaminectomy syndrome, not elsewhere classified: Secondary | ICD-10-CM | POA: Diagnosis not present

## 2018-02-18 DIAGNOSIS — M47812 Spondylosis without myelopathy or radiculopathy, cervical region: Secondary | ICD-10-CM | POA: Diagnosis not present

## 2018-02-20 DIAGNOSIS — G4733 Obstructive sleep apnea (adult) (pediatric): Secondary | ICD-10-CM | POA: Diagnosis not present

## 2018-03-19 DIAGNOSIS — Z79891 Long term (current) use of opiate analgesic: Secondary | ICD-10-CM | POA: Diagnosis not present

## 2018-03-19 DIAGNOSIS — G894 Chronic pain syndrome: Secondary | ICD-10-CM | POA: Diagnosis not present

## 2018-03-19 DIAGNOSIS — M47817 Spondylosis without myelopathy or radiculopathy, lumbosacral region: Secondary | ICD-10-CM | POA: Diagnosis not present

## 2018-03-19 DIAGNOSIS — M47812 Spondylosis without myelopathy or radiculopathy, cervical region: Secondary | ICD-10-CM | POA: Diagnosis not present

## 2018-03-19 DIAGNOSIS — M961 Postlaminectomy syndrome, not elsewhere classified: Secondary | ICD-10-CM | POA: Diagnosis not present

## 2018-04-04 DIAGNOSIS — H2513 Age-related nuclear cataract, bilateral: Secondary | ICD-10-CM | POA: Diagnosis not present

## 2018-04-04 DIAGNOSIS — H52203 Unspecified astigmatism, bilateral: Secondary | ICD-10-CM | POA: Diagnosis not present

## 2018-04-04 DIAGNOSIS — H524 Presbyopia: Secondary | ICD-10-CM | POA: Diagnosis not present

## 2018-04-04 DIAGNOSIS — H25013 Cortical age-related cataract, bilateral: Secondary | ICD-10-CM | POA: Diagnosis not present

## 2018-04-04 DIAGNOSIS — H5203 Hypermetropia, bilateral: Secondary | ICD-10-CM | POA: Diagnosis not present

## 2018-04-18 DIAGNOSIS — M961 Postlaminectomy syndrome, not elsewhere classified: Secondary | ICD-10-CM | POA: Diagnosis not present

## 2018-04-18 DIAGNOSIS — M47817 Spondylosis without myelopathy or radiculopathy, lumbosacral region: Secondary | ICD-10-CM | POA: Diagnosis not present

## 2018-04-18 DIAGNOSIS — M47812 Spondylosis without myelopathy or radiculopathy, cervical region: Secondary | ICD-10-CM | POA: Diagnosis not present

## 2018-04-18 DIAGNOSIS — G894 Chronic pain syndrome: Secondary | ICD-10-CM | POA: Diagnosis not present

## 2018-05-20 DIAGNOSIS — M47817 Spondylosis without myelopathy or radiculopathy, lumbosacral region: Secondary | ICD-10-CM | POA: Diagnosis not present

## 2018-05-20 DIAGNOSIS — M47812 Spondylosis without myelopathy or radiculopathy, cervical region: Secondary | ICD-10-CM | POA: Diagnosis not present

## 2018-05-20 DIAGNOSIS — G894 Chronic pain syndrome: Secondary | ICD-10-CM | POA: Diagnosis not present

## 2018-05-20 DIAGNOSIS — M961 Postlaminectomy syndrome, not elsewhere classified: Secondary | ICD-10-CM | POA: Diagnosis not present

## 2018-06-19 DIAGNOSIS — M961 Postlaminectomy syndrome, not elsewhere classified: Secondary | ICD-10-CM | POA: Diagnosis not present

## 2018-06-19 DIAGNOSIS — M47812 Spondylosis without myelopathy or radiculopathy, cervical region: Secondary | ICD-10-CM | POA: Diagnosis not present

## 2018-06-19 DIAGNOSIS — G894 Chronic pain syndrome: Secondary | ICD-10-CM | POA: Diagnosis not present

## 2018-06-19 DIAGNOSIS — M47817 Spondylosis without myelopathy or radiculopathy, lumbosacral region: Secondary | ICD-10-CM | POA: Diagnosis not present

## 2018-06-28 DIAGNOSIS — G4733 Obstructive sleep apnea (adult) (pediatric): Secondary | ICD-10-CM | POA: Diagnosis not present

## 2018-07-18 DIAGNOSIS — G894 Chronic pain syndrome: Secondary | ICD-10-CM | POA: Diagnosis not present

## 2018-07-18 DIAGNOSIS — M961 Postlaminectomy syndrome, not elsewhere classified: Secondary | ICD-10-CM | POA: Diagnosis not present

## 2018-07-18 DIAGNOSIS — M47812 Spondylosis without myelopathy or radiculopathy, cervical region: Secondary | ICD-10-CM | POA: Diagnosis not present

## 2018-07-18 DIAGNOSIS — M47817 Spondylosis without myelopathy or radiculopathy, lumbosacral region: Secondary | ICD-10-CM | POA: Diagnosis not present

## 2018-08-19 DIAGNOSIS — M47817 Spondylosis without myelopathy or radiculopathy, lumbosacral region: Secondary | ICD-10-CM | POA: Diagnosis not present

## 2018-08-19 DIAGNOSIS — M961 Postlaminectomy syndrome, not elsewhere classified: Secondary | ICD-10-CM | POA: Diagnosis not present

## 2018-08-19 DIAGNOSIS — M47812 Spondylosis without myelopathy or radiculopathy, cervical region: Secondary | ICD-10-CM | POA: Diagnosis not present

## 2018-08-19 DIAGNOSIS — G894 Chronic pain syndrome: Secondary | ICD-10-CM | POA: Diagnosis not present

## 2018-09-17 DIAGNOSIS — G894 Chronic pain syndrome: Secondary | ICD-10-CM | POA: Diagnosis not present

## 2018-09-17 DIAGNOSIS — M47817 Spondylosis without myelopathy or radiculopathy, lumbosacral region: Secondary | ICD-10-CM | POA: Diagnosis not present

## 2018-09-17 DIAGNOSIS — M961 Postlaminectomy syndrome, not elsewhere classified: Secondary | ICD-10-CM | POA: Diagnosis not present

## 2018-09-17 DIAGNOSIS — M47812 Spondylosis without myelopathy or radiculopathy, cervical region: Secondary | ICD-10-CM | POA: Diagnosis not present

## 2018-10-17 DIAGNOSIS — J069 Acute upper respiratory infection, unspecified: Secondary | ICD-10-CM | POA: Diagnosis not present

## 2018-10-18 ENCOUNTER — Other Ambulatory Visit: Payer: Self-pay

## 2018-10-18 ENCOUNTER — Emergency Department (HOSPITAL_COMMUNITY): Payer: Medicare Other

## 2018-10-18 ENCOUNTER — Encounter (HOSPITAL_COMMUNITY): Payer: Self-pay

## 2018-10-18 ENCOUNTER — Observation Stay (HOSPITAL_COMMUNITY)
Admission: EM | Admit: 2018-10-18 | Discharge: 2018-10-19 | Disposition: A | Discharge status: Home / Self Care | Payer: Medicare Other | Attending: Internal Medicine | Admitting: Internal Medicine

## 2018-10-18 DIAGNOSIS — H532 Diplopia: Secondary | ICD-10-CM | POA: Diagnosis not present

## 2018-10-18 DIAGNOSIS — G894 Chronic pain syndrome: Secondary | ICD-10-CM | POA: Diagnosis not present

## 2018-10-18 DIAGNOSIS — N183 Chronic kidney disease, stage 3 (moderate): Secondary | ICD-10-CM | POA: Diagnosis not present

## 2018-10-18 DIAGNOSIS — I6621 Occlusion and stenosis of right posterior cerebral artery: Secondary | ICD-10-CM | POA: Insufficient documentation

## 2018-10-18 DIAGNOSIS — J9811 Atelectasis: Secondary | ICD-10-CM | POA: Diagnosis not present

## 2018-10-18 DIAGNOSIS — M47812 Spondylosis without myelopathy or radiculopathy, cervical region: Secondary | ICD-10-CM | POA: Diagnosis not present

## 2018-10-18 DIAGNOSIS — Z20828 Contact with and (suspected) exposure to other viral communicable diseases: Secondary | ICD-10-CM | POA: Insufficient documentation

## 2018-10-18 DIAGNOSIS — I10 Essential (primary) hypertension: Secondary | ICD-10-CM

## 2018-10-18 DIAGNOSIS — G459 Transient cerebral ischemic attack, unspecified: Secondary | ICD-10-CM

## 2018-10-18 DIAGNOSIS — R51 Headache: Secondary | ICD-10-CM | POA: Diagnosis not present

## 2018-10-18 DIAGNOSIS — M47817 Spondylosis without myelopathy or radiculopathy, lumbosacral region: Secondary | ICD-10-CM | POA: Diagnosis not present

## 2018-10-18 DIAGNOSIS — M961 Postlaminectomy syndrome, not elsewhere classified: Secondary | ICD-10-CM | POA: Diagnosis not present

## 2018-10-18 DIAGNOSIS — I6523 Occlusion and stenosis of bilateral carotid arteries: Secondary | ICD-10-CM | POA: Diagnosis not present

## 2018-10-18 DIAGNOSIS — Z79899 Other long term (current) drug therapy: Secondary | ICD-10-CM | POA: Insufficient documentation

## 2018-10-18 DIAGNOSIS — I129 Hypertensive chronic kidney disease with stage 1 through stage 4 chronic kidney disease, or unspecified chronic kidney disease: Secondary | ICD-10-CM | POA: Insufficient documentation

## 2018-10-18 DIAGNOSIS — I517 Cardiomegaly: Secondary | ICD-10-CM | POA: Diagnosis not present

## 2018-10-18 DIAGNOSIS — R5383 Other fatigue: Secondary | ICD-10-CM | POA: Diagnosis not present

## 2018-10-18 DIAGNOSIS — E785 Hyperlipidemia, unspecified: Secondary | ICD-10-CM

## 2018-10-18 DIAGNOSIS — N400 Enlarged prostate without lower urinary tract symptoms: Secondary | ICD-10-CM

## 2018-10-18 LAB — COMPREHENSIVE METABOLIC PANEL
ALT: 22 U/L (ref 0–44)
AST: 18 U/L (ref 15–41)
Albumin: 4.7 g/dL (ref 3.5–5.0)
Alkaline Phosphatase: 73 U/L (ref 38–126)
Anion gap: 7 (ref 5–15)
BUN: 30 mg/dL — ABNORMAL HIGH (ref 8–23)
CO2: 28 mmol/L (ref 22–32)
Calcium: 8.8 mg/dL — ABNORMAL LOW (ref 8.9–10.3)
Chloride: 97 mmol/L — ABNORMAL LOW (ref 98–111)
Creatinine, Ser: 1.34 mg/dL — ABNORMAL HIGH (ref 0.61–1.24)
GFR calc Af Amer: 58 mL/min — ABNORMAL LOW (ref >60)
GFR calc non Af Amer: 50 mL/min — ABNORMAL LOW (ref >60)
Glucose, Bld: 123 mg/dL — ABNORMAL HIGH (ref 70–99)
Potassium: 3.4 mmol/L — ABNORMAL LOW (ref 3.5–5.1)
Sodium: 132 mmol/L — ABNORMAL LOW (ref 135–145)
Total Bilirubin: 0.5 mg/dL (ref 0.3–1.2)
Total Protein: 7.8 g/dL (ref 6.5–8.1)

## 2018-10-18 LAB — I-STAT CHEM 8, ED
BUN: 28 mg/dL — ABNORMAL HIGH (ref 8–23)
Calcium, Ion: 1.16 mmol/L (ref 1.15–1.40)
Chloride: 102 mmol/L (ref 98–111)
Creatinine, Ser: 1.4 mg/dL — ABNORMAL HIGH (ref 0.61–1.24)
Glucose, Bld: 124 mg/dL — ABNORMAL HIGH (ref 70–99)
HCT: 37 % — ABNORMAL LOW (ref 39.0–52.0)
Hemoglobin: 12.6 g/dL — ABNORMAL LOW (ref 13.0–17.0)
Potassium: 3.6 mmol/L (ref 3.5–5.1)
Sodium: 138 mmol/L (ref 135–145)
TCO2: 27 mmol/L (ref 22–32)

## 2018-10-18 LAB — URINALYSIS, ROUTINE W REFLEX MICROSCOPIC
Bilirubin Urine: NEGATIVE
Glucose, UA: NEGATIVE mg/dL
Hgb urine dipstick: NEGATIVE
Ketones, ur: NEGATIVE mg/dL
Leukocytes,Ua: NEGATIVE
Nitrite: NEGATIVE
Protein, ur: NEGATIVE mg/dL
Specific Gravity, Urine: 1.009 (ref 1.005–1.030)
pH: 6 (ref 5.0–8.0)

## 2018-10-18 LAB — RAPID URINE DRUG SCREEN, HOSP PERFORMED
Amphetamines: NOT DETECTED
Barbiturates: NOT DETECTED
Benzodiazepines: NOT DETECTED
Cocaine: NOT DETECTED
Opiates: POSITIVE — AB
Tetrahydrocannabinol: NOT DETECTED

## 2018-10-18 LAB — ETHANOL: Alcohol, Ethyl (B): <10 mg/dL (ref <10)

## 2018-10-18 LAB — APTT: aPTT: 38 seconds — ABNORMAL HIGH (ref 24–36)

## 2018-10-18 LAB — CBC
HCT: 36.8 % — ABNORMAL LOW (ref 39.0–52.0)
Hemoglobin: 12.5 g/dL — ABNORMAL LOW (ref 13.0–17.0)
MCH: 33.1 pg (ref 26.0–34.0)
MCHC: 34 g/dL (ref 30.0–36.0)
MCV: 97.4 fL (ref 80.0–100.0)
Platelets: 188 10*3/uL (ref 150–400)
RBC: 3.78 MIL/uL — ABNORMAL LOW (ref 4.22–5.81)
RDW: 13.7 % (ref 11.5–15.5)
WBC: 5.1 10*3/uL (ref 4.0–10.5)
nRBC: 0 % (ref 0.0–0.2)

## 2018-10-18 LAB — PROTIME-INR
INR: 1 (ref 0.8–1.2)
Prothrombin Time: 13.5 seconds (ref 11.4–15.2)

## 2018-10-18 LAB — CBG MONITORING, ED: Glucose-Capillary: 142 mg/dL — ABNORMAL HIGH (ref 70–99)

## 2018-10-18 LAB — SARS CORONAVIRUS 2 BY RT PCR (HOSPITAL ORDER, PERFORMED IN ~~LOC~~ HOSPITAL LAB): SARS Coronavirus 2: NEGATIVE

## 2018-10-18 MED ORDER — SODIUM CHLORIDE 0.9% FLUSH: 3.0000 mL | Freq: Once | INTRAVENOUS | Status: DC

## 2018-10-18 MED ORDER — ONDANSETRON HCL 4 MG/2ML IJ SOLN: 4.0000 mg | Freq: Four times a day (QID) | INTRAMUSCULAR | Status: DC | PRN

## 2018-10-18 MED ORDER — SODIUM CHLORIDE (PF) 0.9 % IJ SOLN
INTRAMUSCULAR | Status: AC
  Filled 2018-10-18: qty 50

## 2018-10-18 MED ORDER — DULOXETINE HCL 60 MG PO CPEP
60.0000 mg | ORAL_CAPSULE | Freq: Every day | ORAL | Status: DC
  Administered 2018-10-18: 60 mg via ORAL
  Filled 2018-10-18: qty 1

## 2018-10-18 MED ORDER — ACETAMINOPHEN 650 MG RE SUPP: 650.0000 mg | Freq: Four times a day (QID) | RECTAL | Status: DC | PRN

## 2018-10-18 MED ORDER — HEPARIN SODIUM (PORCINE) 5000 UNIT/ML IJ SOLN
5000.0000 [IU] | Freq: Three times a day (TID) | INTRAMUSCULAR | Status: DC
  Administered 2018-10-18 – 2018-10-19 (×2): 5000 [IU] via SUBCUTANEOUS
  Filled 2018-10-18 (×2): qty 1

## 2018-10-18 MED ORDER — ROPINIROLE HCL 1 MG PO TABS
1.0000 mg | ORAL_TABLET | Freq: Every day | ORAL | Status: DC
  Administered 2018-10-18: 1 mg via ORAL
  Filled 2018-10-18: qty 1

## 2018-10-18 MED ORDER — SODIUM CHLORIDE 0.9% FLUSH
3.0000 mL | Freq: Two times a day (BID) | INTRAVENOUS | Status: DC
  Administered 2018-10-18 – 2018-10-19 (×2): 3 mL via INTRAVENOUS

## 2018-10-18 MED ORDER — TAMSULOSIN HCL 0.4 MG PO CAPS
0.4000 mg | ORAL_CAPSULE | Freq: Two times a day (BID) | ORAL | Status: DC
  Administered 2018-10-18 – 2018-10-19 (×2): 0.4 mg via ORAL
  Filled 2018-10-18 (×2): qty 1

## 2018-10-18 MED ORDER — IOHEXOL 350 MG/ML SOLN
100.0000 mL | Freq: Once | INTRAVENOUS | Status: AC | PRN
  Administered 2018-10-18: 100 mL via INTRAVENOUS

## 2018-10-18 MED ORDER — GABAPENTIN 300 MG PO CAPS: 300.0000 mg | ORAL_CAPSULE | Freq: Three times a day (TID) | ORAL | Status: DC | PRN

## 2018-10-18 MED ORDER — SODIUM CHLORIDE 0.9% FLUSH: 3.0000 mL | Freq: Two times a day (BID) | INTRAVENOUS | Status: DC

## 2018-10-18 MED ORDER — SODIUM CHLORIDE 0.9% FLUSH: 3.0000 mL | INTRAVENOUS | Status: DC | PRN

## 2018-10-18 MED ORDER — SODIUM CHLORIDE 0.9 % IV SOLN: 250.0000 mL | INTRAVENOUS | Status: DC | PRN

## 2018-10-18 MED ORDER — ACETAMINOPHEN 325 MG PO TABS: 650.0000 mg | ORAL_TABLET | Freq: Four times a day (QID) | ORAL | Status: DC | PRN

## 2018-10-18 MED ORDER — ONDANSETRON HCL 4 MG PO TABS: 4.0000 mg | ORAL_TABLET | Freq: Four times a day (QID) | ORAL | Status: DC | PRN

## 2018-10-18 MED ORDER — OXYCODONE HCL 5 MG PO TABS
15.0000 mg | ORAL_TABLET | ORAL | Status: DC | PRN
  Administered 2018-10-18 – 2018-10-19 (×2): 15 mg via ORAL
  Filled 2018-10-18 (×2): qty 3

## 2018-10-18 NOTE — H&P (Signed)
History and Physical    Ronald [name] WSF:[id number] DOB: 13-Jun-1940 DOA: 10/18/2018  PCP: Mateo Flow, MD  Patient coming from: Home  Chief Complaint: double vision and weakness   HPI: Ronald [name] is a 77 y.o. male with medical history significant of HTN, chronic pain on oxycodone, BPH, who presents with double vision and weakness.  He admits that he has been feeling unsteady with ambulation, double vision especially when he is moving, specifically when he moves his head.  This is intermittent in nature and resolved currently.  He is having no other symptoms, feeling well otherwise.  He denies any speech deficits, vision loss, focal weakness or confusion.  ED Course: In the emergency department, labs obtained which revealed potassium 3.4, creatinine 1.34.  CTA head and neck positive for severe right PCA stenosis.  Unable to obtain MRI due to spine stimulator.  Case discussed with neurology, they will consult on patient and recommending patient transferred to [facility].  Review of Systems: As per HPI otherwise 10 point review of systems negative.   Past Medical History:  Diagnosis Date  . Allergic rhinitis   . Anxiety   . BPH (benign prostatic hyperplasia)   . Chronic pain syndrome   . Hypertension   . Lumbar radicular pain   . Major depressive disorder   . Mixed hyperlipidemia   . Neuropathy   . Sleep apnea     Past Surgical History:  Procedure Laterality Date  . BACK SURGERY    . CERVICAL FUSION    . colonoscopy     reported as normal  . LUMBAR NERVE STIMLATOR INSERTION    . THYROIDECTOMY       reports that he has never smoked. He has never used smokeless tobacco. He reports that he does not drink alcohol or use drugs.  Allergies  Allergen Reactions  . Phenobarbital Rash    Family History  Problem Relation Age of Onset  . Coronary artery disease Father 4       CABG  . Hypertension Father   . Diabetes Father     Prior to Admission medications    Medication Sig Start Date End Date Taking? Authorizing Provider  amLODipine (NORVASC) 5 MG tablet Take 5 mg by mouth daily.   Yes [provider]  DULoxetine (CYMBALTA) 60 MG capsule Take 60 mg by mouth daily.   Yes [provider]  furosemide (LASIX) 20 MG tablet Take 20 mg by mouth daily.  01/19/17  Yes [provider]  gabapentin (NEURONTIN) 300 MG capsule Take 300 mg by mouth 3 (three) times daily as needed (pain).    Yes [provider]  losartan (COZAAR) 25 MG tablet Take 1 tablet (25 mg total) by mouth daily. 11/07/12  Yes Gerda Diss, DO  Multiple Vitamins-Minerals (MULTIVITAMIN WITH MINERALS) tablet Take 1 tablet by mouth daily.   Yes [provider]  oxyCODONE (ROXICODONE) 15 MG immediate release tablet Take 15 mg by mouth every 4 (four) hours as needed for pain. Up to 7 tablets in a day.   Yes [provider]  rOPINIRole (REQUIP) 0.5 MG tablet Take 1 mg by mouth at bedtime. 12/03/17  Yes [provider]  tamsulosin (FLOMAX) 0.4 MG CAPS Take 0.4 mg by mouth 2 (two) times a day.    Yes [provider]  aspirin EC 81 MG EC tablet Take 1 tablet (81 mg total) by mouth daily. Patient not taking: Reported on 10/18/2018 11/07/12  Gerda Diss, DO    Physical Exam: Vitals:   10/18/18 1301 10/18/18 1420 10/18/18 1446 10/18/18 1500  BP: (!) 167/77  (!) 173/87 (!) 162/79  Pulse: 67 69 79 68  Resp: 18  13 10   Temp:      TempSrc:      SpO2: 99% 94% 98% 95%  Weight:      Height:        Constitutional: NAD, calm, comfortable Eyes: PERRL, lids and conjunctivae normal ENMT: Mucous membranes are moist. Posterior pharynx clear of any exudate or lesions.Normal dentition.  Neck: normal, supple, no masses, no thyromegaly Respiratory: clear to auscultation bilaterally, no wheezing, no crackles. Normal respiratory effort. No accessory muscle use.  Cardiovascular: Regular rate and rhythm, no murmurs / rubs / gallops. No  extremity edema.  Abdomen: no tenderness, no masses palpated. No hepatosplenomegaly. Bowel sounds positive.  Musculoskeletal: no clubbing / cyanosis. No joint deformity upper and lower extremities. Good ROM, no contractures. Normal muscle tone.  Skin: no rashes, lesions, ulcers. No induration Neurologic: CN 2-12 grossly intact. Strength 5/5 in all 4.  Speech clear.  Finger-nose symmetric Psychiatric: Normal judgment and insight. Alert and oriented x 3. Normal mood.   Labs on Admission: I have personally reviewed following labs and imaging studies  CBC: Recent Labs  Lab 10/18/18 1126 10/18/18 1145  WBC 5.1  --   HGB 12.5* 12.6*  HCT 36.8* 37.0*  MCV 97.4  --   PLT 188  --    Basic Metabolic Panel: Recent Labs  Lab 10/18/18 1126 10/18/18 1145  NA 132* 138  K 3.4* 3.6  CL 97* 102  CO2 28  --   GLUCOSE 123* 124*  BUN 30* 28*  CREATININE 1.34* 1.40*  CALCIUM 8.8*  --    GFR: Estimated Creatinine Clearance: 45.9 mL/min (A) (by C-G formula based on SCr of 1.4 mg/dL (H)). Liver Function Tests: Recent Labs  Lab 10/18/18 1126  AST 18  ALT 22  ALKPHOS 73  BILITOT 0.5  PROT 7.8  ALBUMIN 4.7   No results for input(s): LIPASE, AMYLASE in the last 168 hours. No results for input(s): AMMONIA in the last 168 hours. Coagulation Profile: Recent Labs  Lab 10/18/18 1126  INR 1.0   Cardiac Enzymes: No results for input(s): CKTOTAL, CKMB, CKMBINDEX, TROPONINI in the last 168 hours. BNP (last 3 results) No results for input(s): PROBNP in the last 8760 hours. HbA1C: No results for input(s): HGBA1C in the last 72 hours. CBG: Recent Labs  Lab 10/18/18 1112  GLUCAP 142*   Lipid Profile: No results for input(s): CHOL, HDL, LDLCALC, TRIG, CHOLHDL, LDLDIRECT in the last 72 hours. Thyroid Function Tests: No results for input(s): TSH, T4TOTAL, FREET4, T3FREE, THYROIDAB in the last 72 hours. Anemia Panel: No results for input(s): VITAMINB12, FOLATE, FERRITIN, TIBC, IRON,  RETICCTPCT in the last 72 hours. Urine analysis:    Component Value Date/Time   COLORURINE YELLOW 10/18/2018 1300   APPEARANCEUR HAZY (A) 10/18/2018 1300   LABSPEC 1.009 10/18/2018 1300   PHURINE 6.0 10/18/2018 1300   GLUCOSEU NEGATIVE 10/18/2018 1300   HGBUR NEGATIVE 10/18/2018 1300   BILIRUBINUR NEGATIVE 10/18/2018 1300   KETONESUR NEGATIVE 10/18/2018 1300   PROTEINUR NEGATIVE 10/18/2018 1300   NITRITE NEGATIVE 10/18/2018 1300   LEUKOCYTESUR NEGATIVE 10/18/2018 1300   Sepsis Labs: !!!!!!!!!!!!!!!!!!!!!!!!!!!!!!!!!!!!!!!!!!!! @LABRCNTIP (procalcitonin:4,lacticidven:4) ) Recent Results (from the past 240 hour(s))  SARS Coronavirus 2 (CEPHEID - Performed in [place] hospital lab), Hosp Order     Status: None  Collection Time: 10/18/18 11:26 AM  Result Value Ref Range Status   SARS Coronavirus 2 NEGATIVE NEGATIVE Final    Comment: (NOTE) If result is NEGATIVE SARS-CoV-2 target nucleic acids are NOT DETECTED. The SARS-CoV-2 RNA is generally detectable in upper and lower  respiratory specimens during the acute phase of infection. The lowest  concentration of SARS-CoV-2 viral copies this assay can detect is 250  copies / mL. A negative result does not preclude SARS-CoV-2 infection  and should not be used as the sole basis for treatment or other  patient management decisions.  A negative result may occur with  improper specimen collection / handling, submission of specimen other  than nasopharyngeal swab, presence of viral mutation(s) within the  areas targeted by this assay, and inadequate number of viral copies  (<250 copies / mL). A negative result must be combined with clinical  observations, patient history, and epidemiological information. If result is POSITIVE SARS-CoV-2 target nucleic acids are DETECTED. The SARS-CoV-2 RNA is generally detectable in upper and lower  respiratory specimens dur ing the acute phase of infection.  Positive  results are indicative of active  infection with SARS-CoV-2.  Clinical  correlation with patient history and other diagnostic information is  necessary to determine patient infection status.  Positive results do  not rule out bacterial infection or co-infection with other viruses. If result is PRESUMPTIVE POSTIVE SARS-CoV-2 nucleic acids MAY BE PRESENT.   A presumptive positive result was obtained on the submitted specimen  and confirmed on repeat testing.  While 2019 novel coronavirus  (SARS-CoV-2) nucleic acids may be present in the submitted sample  additional confirmatory testing may be necessary for epidemiological  and / or clinical management purposes  to differentiate between  SARS-CoV-2 and other Sarbecovirus currently known to infect humans.  If clinically indicated additional testing with an alternate test  methodology [phone]) is advised. The SARS-CoV-2 RNA is generally  detectable in upper and lower respiratory sp ecimens during the acute  phase of infection. The expected result is Negative. Fact Sheet for Patients:  StrictlyIdeas.no Fact Sheet for Healthcare Providers: BankingDealers.co.za This test is not yet approved or cleared by the [place] FDA and has been authorized for detection and/or diagnosis of SARS-CoV-2 by FDA under an Emergency Use Authorization (EUA).  This EUA will remain in effect (meaning this test can be used) for the duration of the COVID-19 declaration under Section 564(b)(1) of the Act, 21 U.S.C. section 360bbb-3(b)(1), unless the authorization is terminated or revoked sooner. Performed at [facility], [place] [address], [place], [place] [address]      Radiological Exams on Admission: Ct Angio Head W Or Wo Contrast  Result Date: 10/18/2018 CLINICAL DATA:  78 year old male with intermittent diplopia for several days. EXAM: CT ANGIOGRAPHY HEAD AND NECK TECHNIQUE: Multidetector CT imaging of the head and neck was  performed using the standard protocol during bolus administration of intravenous contrast. Multiplanar CT image reconstructions and MIPs were obtained to evaluate the vascular anatomy. Carotid stenosis measurements (when applicable) are obtained utilizing NASCET criteria, using the distal internal carotid diameter as the denominator. CONTRAST:  144mL OMNIPAQUE IOHEXOL 350 MG/ML SOLN COMPARISON:  Brain MRI 07/28/2006. Head CT 11/02/2015. FINDINGS: CT HEAD Brain: Stable cerebral volume since 2017. 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 Stable gray-white matter differentiation throughout the brain. No midline shift, ventriculomegaly, mass effect, evidence of mass lesion, intracranial hemorrhage  or evidence of cortically based acute infarction. Normal suprasellar cistern. Calvarium and skull base: No acute osseous abnormality identified. Paranasal sinuses: New left sphenoid sinus mucosal thickening. Other paranasal sinuses and mastoids are stable and well pneumatized. Orbits: Orbits soft tissues appears stable and negative. Negative scalp soft tissues. 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 There is also incomplete segmentation in the lower cervical spine at C6-C7. No acute osseous abnormality identified. Upper chest: Negative. Other neck: Grossly negative neck soft tissues. Aortic arch: Calcified aortic atherosclerosis. 3 vessel arch configuration. Right carotid system: Mild soft plaque in the right CCA proximal to the bifurcation without stenosis. Soft and calcified plaque at the right ICA origin and bulb appears mildly ulcerated (series 14, image 62) but there is less than 50% stenosis with respect to the distal vessel. Left carotid system: No left CCA stenosis despite mild soft plaque. Soft and  calcified plaque at the left ICA origin and bulb with up to 50% stenosis with respect to the distal vessel. Vertebral arteries: No proximal right subclavian artery stenosis despite calcified plaque. Mild plaque and stenosis at the right vertebral artery origin. Otherwise patent right vertebral artery to the skull base without stenosis. No proximal left subclavian artery stenosis. Normal left vertebral artery origin. Tortuous left V1 segment. Mild left V3 calcified plaque. Mildly dominant and patent left vertebral artery to the skull base without stenosis. CTA HEAD Posterior circulation: Mildly dominant left vertebral artery. Right V4 segment calcified plaque with only mild stenosis. No distal left vertebral stenosis. Patent PICA origins and vertebrobasilar junction. Patent basilar artery without stenosis. Patent AICA, SCA and PCA origins. Mild left PCA irregularity without stenosis. There is severe right PCA stenosis at the P1/P2 junction (series 17, image 16) but distal right PCA branches remain normal. Posterior communicating arteries are diminutive or absent. Anterior circulation: Both ICA siphons are patent. For both siphons are mildly dolichoectatic. Mild calcified plaque on the left without stenosis. Mild to moderate calcified plaque on the right without stenosis. The right ophthalmic artery origin is normal. The left is not identified. Patent carotid termini. Normal MCA and ACA origins. Anterior communicating artery and bilateral ACA branches are within normal limits. Left MCA M1 segment, bifurcation, and left MCA branches are within normal limits. Right MCA M1 segment, bifurcation, and right MCA branches are within normal limits. Venous sinuses: Patent. Anatomic variants: Mildly dominant left vertebral artery. Review of the MIP images confirms the above findings IMPRESSION: 1. 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 Are there left side  vision symptoms to suggest right PCA territory ischemia? 2. Arterial tortuosity and atherosclerosis in the head and neck with no other significant stenosis and no large vessel occlusion identified. 3. Stable noncontrast CT appearance of the brain since 2017. No acute cortically based infarct or intracranial hemorrhage. 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 4. 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 Electronically Signed   By: Genevie Ann M.D.   On: 10/18/2018 13:37   Dg Chest 2 View  Result Date: 10/18/2018 CLINICAL DATA:  Double vision. EXAM: CHEST - 2 VIEW COMPARISON:  CT 12/27/2017.  Chest x-ray 11/02/2015. FINDINGS: Mediastinum hilar structures are normal. Mild bibasilar atelectasis. No acute infiltrate. No pleural effusion or pneumothorax. Stable mild cardiomegaly. No pulmonary venous congestion. Cervicothoracic  spine scoliosis and degenerative change. Neurostimulator noted in stable position over the thoracic spine. Carotid vascular calcification. IMPRESSION: 1.  Stable cardiomegaly.  No pulmonary venous congestion. 2.  Mild bibasilar atelectasis. 3. Cervicothoracic spine scoliosis and degenerative change. Neurostimulator noted in stable position over thoracic spine. 4.  Carotid vascular disease. Electronically Signed   By: Marcello Moores  Register   On: 10/18/2018 12:01   Ct Angio Neck W And/or Wo Contrast  Result Date: 10/18/2018 CLINICAL DATA:  78 year old male with intermittent diplopia for several days. EXAM: CT ANGIOGRAPHY HEAD AND NECK TECHNIQUE: Multidetector CT imaging of the head and neck was performed using the standard protocol during bolus administration of intravenous contrast. Multiplanar CT image reconstructions and MIPs were obtained to evaluate the vascular anatomy. Carotid stenosis measurements (when applicable) are obtained utilizing NASCET criteria, using the distal  internal carotid diameter as the denominator. CONTRAST:  120mL OMNIPAQUE IOHEXOL 350 MG/ML SOLN COMPARISON:  Brain MRI 07/28/2006. Head CT 11/02/2015. FINDINGS: CT HEAD Brain: Stable cerebral volume since 2017. 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 Stable gray-white matter differentiation throughout the brain. No midline shift, ventriculomegaly, mass effect, evidence of mass lesion, intracranial hemorrhage or evidence of cortically based acute infarction. Normal suprasellar cistern. Calvarium and skull base: No acute osseous abnormality identified. Paranasal sinuses: New left sphenoid sinus mucosal thickening. Other paranasal sinuses and mastoids are stable and well pneumatized. Orbits: Orbits soft tissues appears stable and negative. Negative scalp soft tissues. 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 There is also incomplete segmentation in the lower cervical spine at C6-C7. No acute osseous abnormality identified. Upper chest: Negative. Other neck: Grossly negative neck soft tissues. Aortic arch: Calcified aortic atherosclerosis. 3 vessel arch configuration. Right carotid system: Mild soft plaque in the right CCA proximal to the bifurcation without stenosis. Soft and calcified plaque at the right ICA origin and bulb appears mildly ulcerated (series 14, image 62) but there is less than 50% stenosis with respect to the distal vessel. Left carotid system: No left CCA stenosis despite mild soft plaque. Soft and calcified plaque at the left ICA origin and bulb with up to 50% stenosis with respect to the distal vessel. Vertebral arteries: No proximal right subclavian artery stenosis despite calcified plaque. Mild plaque and stenosis at the right vertebral artery origin. Otherwise patent right vertebral  artery to the skull base without stenosis. No proximal left subclavian artery stenosis. Normal left vertebral artery origin. Tortuous left V1 segment. Mild left V3 calcified plaque. Mildly dominant and patent left vertebral artery to the skull base without stenosis. CTA HEAD Posterior circulation: Mildly dominant left vertebral artery. Right V4 segment calcified plaque with only mild stenosis. No distal left vertebral stenosis. Patent PICA origins and vertebrobasilar junction. Patent basilar artery without stenosis. Patent AICA, SCA and PCA origins. Mild left PCA irregularity without stenosis. There is severe right PCA stenosis at the P1/P2 junction (series 17, image 16) but distal right PCA branches remain normal. Posterior communicating arteries are diminutive or absent. Anterior circulation: Both ICA siphons are patent. For both siphons are mildly dolichoectatic. Mild calcified plaque on the left without stenosis. Mild to moderate calcified plaque on the right without stenosis. The right ophthalmic artery origin is normal. The left is not identified. Patent carotid termini. Normal MCA and ACA origins. Anterior communicating artery and bilateral ACA  branches are within normal limits. Left MCA M1 segment, bifurcation, and left MCA branches are within normal limits. Right MCA M1 segment, bifurcation, and right MCA branches are within normal limits. Venous sinuses: Patent. Anatomic variants: Mildly dominant left vertebral artery. Review of the MIP images confirms the above findings IMPRESSION: 1. 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 Are there left side vision symptoms to suggest right PCA territory ischemia? 2. Arterial tortuosity and atherosclerosis in the head and neck with no other significant stenosis and no large vessel occlusion identified. 3. Stable noncontrast CT appearance of the brain since 2017. No acute cortically based infarct or  intracranial hemorrhage. 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 4. 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 Electronically Signed   By: Genevie Ann M.D.   On: 10/18/2018 13:37    EKG: Independently reviewed.  Normal sinus rhythm with rate 77  Assessment/Plan Principal Problem:   Diplopia Active Problems:   HTN (hypertension)   Chronic pain syndrome   BPH (benign prostatic hyperplasia)  Diplopia with severe right PCA stenosis -Neurology consult at [facility] to obtain MRI due to spine stimulator in place -PT OT SLP -Further work-up plan per neurology  Chronic back pain -Continue home oxycodone, Cymbalta, Neurontin  Essential hypertension -We will hold Norvasc, Cozaar, lasix for now  CKD stage III -Baseline creatinine around 1.1-1.3 -Stable, continue to monitor  BPH -Continue Flomax    DVT prophylaxis: Subcutaneous heparin Code Status: Full  Family Communication: None, patient declined for me to call his wife Disposition Plan: Transfer to [facility] hospital for neurology consultation  Consults called: Neurology notified by EDP  Admission status: Inpatient    * I certify that at the point of admission it is my clinical judgment that the patient will require inpatient hospital care spanning beyond 2 midnights from the point of admission due to high intensity of service, high risk for further deterioration and high frequency of surveillance required.Dessa Phi, DO [facility] 10/18/2018, 3:42 PM    How to contact the [facility] Attending or Consulting provider [place] or covering provider during after hours [place], for this patient?  1. Check the care team in [facility] and look for a) attending/consulting [facility] provider listed and b) the [facility] team listed 2. Log into [email or web address] and use [place]'s universal password to access. If you do  not have the password, please contact the hospital operator. 3. Locate the [facility] provider you are looking for under [facility] and page to a number that you can be directly reached. 4. If you still have difficulty reaching the provider, please page the [facility] (Director on Call) for the Hospitalists listed on amion for assistance.

## 2018-10-18 NOTE — ED Notes (Signed)
ED TO INPATIENT HANDOFF REPORT  ED Nurse Name and Phone #: [phone]   S Name/Age/Gender Ronald [name] 78 y.o. male Room/Bed: WA02/WA02  Code Status   Code Status: Prior  Home/SNF/Other Home Patient oriented to: self, place, time and situation Is this baseline? Yes   Triage Complete: Triage complete  Chief Complaint double vision  head pain   Triage Note Pt states that he has been having double vision in his right eye for several minutes to a half hour when changing position for several days. Pt states this problem corrects itself when he closes the other eye. Pt also c/o headaches in rear of head and neck. Pt c/o extremely weak legs and feet since Sunday. Pt has also been very fatigued, along with balance problems.    Allergies Allergies  Allergen Reactions  . Phenobarbital Rash    Level of Care/Admitting Diagnosis ED Disposition    ED Disposition Condition Comment   Admit  Hospital Area: [facility] [[id number]]  Level of Care: Telemetry Medical [104]  Covid Evaluation: Confirmed COVID Negative  Diagnosis: Diplopia [368.2.ICD-9-CM]  Admitting Physician: Dessa Phi [[id number]]  Attending Physician: Dessa Phi [phone]  Estimated length of stay: past midnight tomorrow  Certification:: I certify this patient will need inpatient services for at least 2 midnights  PT Class (Do Not Modify): Inpatient [101]  PT Acc Code (Do Not Modify): Private [1]       B Medical/Surgery History Past Medical History:  Diagnosis Date  . Allergic rhinitis   . Anxiety   . BPH (benign prostatic hyperplasia)   . Chronic pain syndrome   . Hypertension   . Lumbar radicular pain   . Major depressive disorder   . Mixed hyperlipidemia   . Neuropathy   . Sleep apnea    Past Surgical History:  Procedure Laterality Date  . BACK SURGERY    . CERVICAL FUSION    . colonoscopy     reported as normal  . LUMBAR NERVE STIMLATOR INSERTION    . THYROIDECTOMY        A IV Location/Drains/Wounds Patient Lines/Drains/Airways Status   Active Line/Drains/Airways    Name:   Placement date:   Placement time:   Site:   Days:   Peripheral IV 10/18/18 Right Antecubital   10/18/18    1127    Antecubital   less than 1          Intake/Output Last 24 hours No intake or output data in the 24 hours ending 10/18/18 1714  Labs/Imaging Results for orders placed or performed during the hospital encounter of 10/18/18 (from the past 48 hour(s))  CBG monitoring, ED     Status: Abnormal   Collection Time: 10/18/18 11:12 AM  Result Value Ref Range   Glucose-Capillary 142 (H) 70 - 99 mg/dL  CBC     Status: Abnormal   Collection Time: 10/18/18 11:26 AM  Result Value Ref Range   WBC 5.1 4.0 - 10.5 K/uL   RBC 3.78 (L) 4.22 - 5.81 MIL/uL   Hemoglobin 12.5 (L) 13.0 - 17.0 g/dL   HCT 36.8 (L) 39.0 - 52.0 %   MCV 97.4 80.0 - 100.0 fL   MCH 33.1 26.0 - 34.0 pg   MCHC 34.0 30.0 - 36.0 g/dL   RDW 13.7 11.5 - 15.5 %   Platelets 188 150 - 400 K/uL   nRBC 0.0 0.0 - 0.2 %    Comment: Performed at [facility], [address]  [place], [place] [address]  Ethanol     Status: None   Collection Time: 10/18/18 11:26 AM  Result Value Ref Range   Alcohol, Ethyl (B) <10 <10 mg/dL    Comment: (NOTE) Lowest detectable limit for serum alcohol is 10 mg/dL. For medical purposes only. Performed at [facility], [place] [address] [place], [place] [address]   Protime-INR     Status: None   Collection Time: 10/18/18 11:26 AM  Result Value Ref Range   Prothrombin Time 13.5 11.4 - 15.2 seconds   INR 1.0 0.8 - 1.2    Comment: (NOTE) INR goal varies based on device and disease states. Performed at [facility], [place] [address] [place], [place] [address]   APTT     Status: Abnormal   Collection Time: 10/18/18 11:26 AM  Result Value Ref Range   aPTT 38 (H) 24 - 36 seconds    Comment:        IF BASELINE aPTT IS  ELEVATED, SUGGEST PATIENT RISK ASSESSMENT BE USED TO DETERMINE APPROPRIATE ANTICOAGULANT THERAPY. Performed at [facility], [place] [address], [place], [place] [address]   Comprehensive metabolic panel     Status: Abnormal   Collection Time: 10/18/18 11:26 AM  Result Value Ref Range   Sodium 132 (L) 135 - 145 mmol/L   Potassium 3.4 (L) 3.5 - 5.1 mmol/L   Chloride 97 (L) 98 - 111 mmol/L   CO2 28 22 - 32 mmol/L   Glucose, Bld 123 (H) 70 - 99 mg/dL   BUN 30 (H) 8 - 23 mg/dL   Creatinine, Ser 1.34 (H) 0.61 - 1.24 mg/dL   Calcium 8.8 (L) 8.9 - 10.3 mg/dL   Total Protein 7.8 6.5 - 8.1 g/dL   Albumin 4.7 3.5 - 5.0 g/dL   AST 18 15 - 41 U/L   ALT 22 0 - 44 U/L   Alkaline Phosphatase 73 38 - 126 U/L   Total Bilirubin 0.5 0.3 - 1.2 mg/dL   GFR calc non Af Amer 50 (L) >60 mL/min   GFR calc Af Amer 58 (L) >60 mL/min   Anion gap 7 5 - 15    Comment: Performed at [facility], [place] [address], [place], [place] [address]  SARS Coronavirus 2 (CEPHEID - Performed in [place] hospital lab), Hosp Order     Status: None   Collection Time: 10/18/18 11:26 AM  Result Value Ref Range   SARS Coronavirus 2 NEGATIVE NEGATIVE    Comment: (NOTE) If result is NEGATIVE SARS-CoV-2 target nucleic acids are NOT DETECTED. The SARS-CoV-2 RNA is generally detectable in upper and lower  respiratory specimens during the acute phase of infection. The lowest  concentration of SARS-CoV-2 viral copies this assay can detect is 250  copies / mL. A negative result does not preclude SARS-CoV-2 infection  and should not be used as the sole basis for treatment or other  patient management decisions.  A negative result may occur with  improper specimen collection / handling, submission of specimen other  than nasopharyngeal swab, presence of viral mutation(s) within the  areas targeted by this assay, and inadequate number of viral copies  (<250 copies / mL). A negative result must be  combined with clinical  observations, patient history, and epidemiological information. If result is POSITIVE SARS-CoV-2 target nucleic acids are DETECTED. The SARS-CoV-2 RNA is generally detectable in upper and lower  respiratory specimens dur ing the acute phase of infection.  Positive  results are indicative of active infection with SARS-CoV-2.  Clinical  correlation with patient history and other diagnostic information is  necessary to determine patient infection status.  Positive results do  not rule out bacterial infection or co-infection with other viruses. If result is PRESUMPTIVE POSTIVE SARS-CoV-2 nucleic acids MAY BE PRESENT.   A presumptive positive result was obtained on the submitted specimen  and confirmed on repeat testing.  While 2019 novel coronavirus  (SARS-CoV-2) nucleic acids may be present in the submitted sample  additional confirmatory testing may be necessary for epidemiological  and / or clinical management purposes  to differentiate between  SARS-CoV-2 and other Sarbecovirus currently known to infect humans.  If clinically indicated additional testing with an alternate test  methodology [phone]) is advised. The SARS-CoV-2 RNA is generally  detectable in upper and lower respiratory sp ecimens during the acute  phase of infection. The expected result is Negative. Fact Sheet for Patients:  StrictlyIdeas.no Fact Sheet for Healthcare Providers: BankingDealers.co.za This test is not yet approved or cleared by the [place] FDA and has been authorized for detection and/or diagnosis of SARS-CoV-2 by FDA under an Emergency Use Authorization (EUA).  This EUA will remain in effect (meaning this test can be used) for the duration of the COVID-19 declaration under Section 564(b)(1) of the Act, 21 U.S.C. section 360bbb-3(b)(1), unless the authorization is terminated or revoked sooner. Performed at [facility], [place] [address] [place], [place] [address]   Ginger Carne 8, ED     Status: Abnormal   Collection Time: 10/18/18 11:45 AM  Result Value Ref Range   Sodium 138 135 - 145 mmol/L   Potassium 3.6 3.5 - 5.1 mmol/L   Chloride 102 98 - 111 mmol/L   BUN 28 (H) 8 - 23 mg/dL   Creatinine, Ser 1.40 (H) 0.61 - 1.24 mg/dL   Glucose, Bld 124 (H) 70 - 99 mg/dL   Calcium, Ion 1.16 1.15 - 1.40 mmol/L   TCO2 27 22 - 32 mmol/L   Hemoglobin 12.6 (L) 13.0 - 17.0 g/dL   HCT 37.0 (L) 39.0 - 52.0 %  Urinalysis, Routine w reflex microscopic     Status: Abnormal   Collection Time: 10/18/18  1:00 PM  Result Value Ref Range   Color, Urine YELLOW YELLOW   APPearance HAZY (A) CLEAR   Specific Gravity, Urine 1.009 1.005 - 1.030   pH 6.0 5.0 - 8.0   Glucose, UA NEGATIVE NEGATIVE mg/dL   Hgb urine dipstick NEGATIVE NEGATIVE   Bilirubin Urine NEGATIVE NEGATIVE   Ketones, ur NEGATIVE NEGATIVE mg/dL   Protein, ur NEGATIVE NEGATIVE mg/dL   Nitrite NEGATIVE NEGATIVE   Leukocytes,Ua NEGATIVE NEGATIVE    Comment: Performed at [facility], [place] [address], [place], [place] [address]  Urine rapid drug screen (hosp performed)     Status: Abnormal   Collection Time: 10/18/18  1:00 PM  Result Value Ref Range   Opiates POSITIVE (A) NONE DETECTED   Cocaine NONE DETECTED NONE DETECTED   Benzodiazepines NONE DETECTED NONE DETECTED   Amphetamines NONE DETECTED NONE DETECTED   Tetrahydrocannabinol NONE DETECTED NONE DETECTED   Barbiturates NONE DETECTED NONE DETECTED    Comment: (NOTE) DRUG SCREEN FOR MEDICAL PURPOSES ONLY.  IF CONFIRMATION IS NEEDED FOR ANY PURPOSE, NOTIFY LAB WITHIN 5 DAYS. LOWEST DETECTABLE LIMITS FOR URINE DRUG SCREEN Drug Class  Cutoff (ng/mL) Amphetamine and metabolites    1000 Barbiturate and metabolites    200 Benzodiazepine                 710 Tricyclics and metabolites     300 Opiates and metabolites        300 Cocaine and metabolites         300 THC                            50 Performed at [facility], [place] [address] [place], [place] [address]    Ct Angio Head W Or Wo Contrast  Result Date: 10/18/2018 CLINICAL DATA:  78 year old male with intermittent diplopia for several days. EXAM: CT ANGIOGRAPHY HEAD AND NECK TECHNIQUE: Multidetector CT imaging of the head and neck was performed using the standard protocol during bolus administration of intravenous contrast. Multiplanar CT image reconstructions and MIPs were obtained to evaluate the vascular anatomy. Carotid stenosis measurements (when applicable) are obtained utilizing NASCET criteria, using the distal internal carotid diameter as the denominator. CONTRAST:  128mL OMNIPAQUE IOHEXOL 350 MG/ML SOLN COMPARISON:  Brain MRI 07/28/2006. Head CT 11/02/2015. FINDINGS: CT HEAD Brain: Stable cerebral volume since 2017. 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 Stable gray-white matter differentiation throughout the brain. No midline shift, ventriculomegaly, mass effect, evidence of mass lesion, intracranial hemorrhage or evidence of cortically based acute infarction. Normal suprasellar cistern. Calvarium and skull base: No acute osseous abnormality identified. Paranasal sinuses: New left sphenoid sinus mucosal thickening. Other paranasal sinuses and mastoids are stable and well pneumatized. Orbits: Orbits soft tissues appears stable and negative. Negative scalp soft tissues. 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 There is also incomplete segmentation in the lower cervical spine at C6-C7. No acute osseous abnormality identified. Upper chest: Negative. Other neck: Grossly negative neck soft tissues. Aortic arch: Calcified aortic atherosclerosis. 3 vessel arch  configuration. Right carotid system: Mild soft plaque in the right CCA proximal to the bifurcation without stenosis. Soft and calcified plaque at the right ICA origin and bulb appears mildly ulcerated (series 14, image 62) but there is less than 50% stenosis with respect to the distal vessel. Left carotid system: No left CCA stenosis despite mild soft plaque. Soft and calcified plaque at the left ICA origin and bulb with up to 50% stenosis with respect to the distal vessel. Vertebral arteries: No proximal right subclavian artery stenosis despite calcified plaque. Mild plaque and stenosis at the right vertebral artery origin. Otherwise patent right vertebral artery to the skull base without stenosis. No proximal left subclavian artery stenosis. Normal left vertebral artery origin. Tortuous left V1 segment. Mild left V3 calcified plaque. Mildly dominant and patent left vertebral artery to the skull base without stenosis. CTA HEAD Posterior circulation: Mildly dominant left vertebral artery. Right V4 segment calcified plaque with only mild stenosis. No distal left vertebral stenosis. Patent PICA origins and vertebrobasilar junction. Patent basilar artery without stenosis. Patent AICA, SCA and PCA origins. Mild left PCA irregularity without stenosis. There is severe right PCA stenosis at the P1/P2 junction (series 17, image 16) but distal right PCA branches remain normal. Posterior communicating arteries are diminutive or absent. Anterior circulation: Both ICA siphons are patent. For both siphons are mildly dolichoectatic. Mild calcified  plaque on the left without stenosis. Mild to moderate calcified plaque on the right without stenosis. The right ophthalmic artery origin is normal. The left is not identified. Patent carotid termini. Normal MCA and ACA origins. Anterior communicating artery and bilateral ACA branches are within normal limits. Left MCA M1 segment, bifurcation, and left MCA branches are within normal  limits. Right MCA M1 segment, bifurcation, and right MCA branches are within normal limits. Venous sinuses: Patent. Anatomic variants: Mildly dominant left vertebral artery. Review of the MIP images confirms the above findings IMPRESSION: 1. 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 Are there left side vision symptoms to suggest right PCA territory ischemia? 2. Arterial tortuosity and atherosclerosis in the head and neck with no other significant stenosis and no large vessel occlusion identified. 3. Stable noncontrast CT appearance of the brain since 2017. No acute cortically based infarct or intracranial hemorrhage. 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 4. 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 Electronically Signed   By: Genevie Ann M.D.   On: 10/18/2018 13:37   Dg Chest 2 View  Result Date: 10/18/2018 CLINICAL DATA:  Double vision. EXAM: CHEST - 2 VIEW COMPARISON:  CT 12/27/2017.  Chest x-ray 11/02/2015. FINDINGS: Mediastinum hilar structures are normal. Mild bibasilar atelectasis. No acute infiltrate. No pleural effusion or pneumothorax. Stable mild cardiomegaly. No pulmonary venous congestion. Cervicothoracic spine scoliosis and degenerative change. Neurostimulator noted in stable position over the thoracic spine. Carotid vascular calcification. IMPRESSION: 1.  Stable cardiomegaly.  No pulmonary venous congestion. 2.  Mild bibasilar atelectasis. 3. Cervicothoracic spine scoliosis and degenerative change. Neurostimulator noted in stable position over thoracic spine. 4.  Carotid vascular disease. Electronically Signed   By: Marcello Moores  Register   On: 10/18/2018 12:01   Ct Angio Neck W And/or Wo Contrast  Result Date: 10/18/2018 CLINICAL DATA:  78 year old male with intermittent diplopia for several days. EXAM: CT  ANGIOGRAPHY HEAD AND NECK TECHNIQUE: Multidetector CT imaging of the head and neck was performed using the standard protocol during bolus administration of intravenous contrast. Multiplanar CT image reconstructions and MIPs were obtained to evaluate the vascular anatomy. Carotid stenosis measurements (when applicable) are obtained utilizing NASCET criteria, using the distal internal carotid diameter as the denominator. CONTRAST:  141mL OMNIPAQUE IOHEXOL 350 MG/ML SOLN COMPARISON:  Brain MRI 07/28/2006. Head CT 11/02/2015. FINDINGS: CT HEAD Brain: Stable cerebral volume since 2017. 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 Stable gray-white matter differentiation throughout the brain. No midline shift, ventriculomegaly, mass effect, evidence of mass lesion, intracranial hemorrhage or evidence of cortically based acute infarction. Normal suprasellar cistern. Calvarium and skull base: No acute osseous abnormality identified. Paranasal sinuses: New left sphenoid sinus mucosal thickening. Other paranasal sinuses and mastoids are stable and well pneumatized. Orbits: Orbits soft tissues appears stable and negative. Negative scalp soft tissues. 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 There is also incomplete segmentation in the lower cervical spine at C6-C7. No acute osseous abnormality identified. Upper chest: Negative. Other neck: Grossly negative neck soft tissues. Aortic arch: Calcified aortic atherosclerosis. 3 vessel arch configuration. Right carotid system: Mild soft plaque in the right CCA proximal to the bifurcation without stenosis. Soft and calcified plaque  at the right ICA origin and bulb appears mildly ulcerated (series 14, image 62) but there is less than 50% stenosis with respect to the distal  vessel. Left carotid system: No left CCA stenosis despite mild soft plaque. Soft and calcified plaque at the left ICA origin and bulb with up to 50% stenosis with respect to the distal vessel. Vertebral arteries: No proximal right subclavian artery stenosis despite calcified plaque. Mild plaque and stenosis at the right vertebral artery origin. Otherwise patent right vertebral artery to the skull base without stenosis. No proximal left subclavian artery stenosis. Normal left vertebral artery origin. Tortuous left V1 segment. Mild left V3 calcified plaque. Mildly dominant and patent left vertebral artery to the skull base without stenosis. CTA HEAD Posterior circulation: Mildly dominant left vertebral artery. Right V4 segment calcified plaque with only mild stenosis. No distal left vertebral stenosis. Patent PICA origins and vertebrobasilar junction. Patent basilar artery without stenosis. Patent AICA, SCA and PCA origins. Mild left PCA irregularity without stenosis. There is severe right PCA stenosis at the P1/P2 junction (series 17, image 16) but distal right PCA branches remain normal. Posterior communicating arteries are diminutive or absent. Anterior circulation: Both ICA siphons are patent. For both siphons are mildly dolichoectatic. Mild calcified plaque on the left without stenosis. Mild to moderate calcified plaque on the right without stenosis. The right ophthalmic artery origin is normal. The left is not identified. Patent carotid termini. Normal MCA and ACA origins. Anterior communicating artery and bilateral ACA branches are within normal limits. Left MCA M1 segment, bifurcation, and left MCA branches are within normal limits. Right MCA M1 segment, bifurcation, and right MCA branches are within normal limits. Venous sinuses: Patent. Anatomic variants: Mildly dominant left vertebral artery. Review of the MIP images confirms the above findings IMPRESSION: 1. Positive for Severe Right PCA stenosis at the  P1/P2 junction, but preserved distal right PCA branch enhancement. Are there left side vision symptoms to suggest right PCA territory ischemia? 2. Arterial tortuosity and atherosclerosis in the head and neck with no other significant stenosis and no large vessel occlusion identified. 3. Stable noncontrast CT appearance of the brain since 2017. No acute cortically based infarct or intracranial hemorrhage. 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 4. 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 Electronically Signed   By: Genevie Ann M.D.   On: 10/18/2018 13:37    Pending Labs [employer] (From admission, onward)    Start     Ordered   Signed and Held  CBC  Tomorrow morning,   R     Signed and Held   Signed and Occupational hygienist morning,   R     Signed and Held   Signed and Held  Hemoglobin A1c  Tomorrow morning,   R     Signed and Held   Signed and Held  Lipid panel  Tomorrow morning,   R     Signed and Held          Vitals/Pain Today's Vitals   10/18/18 1420 10/18/18 1446 10/18/18 1500 10/18/18 1613  BP:  (!) 173/87 (!) 162/79 (!) 175/81  Pulse: 69 79 68 73  Resp:  13 10 18   Temp:      TempSrc:      SpO2: 94% 98% 95% 95%  Weight:      Height:      PainSc:  Isolation Precautions No active isolations  Medications Medications  sodium chloride flush (NS) 0.9 % injection 3 mL (has no administration in time range)  sodium chloride (PF) 0.9 % injection (has no administration in time range)  oxyCODONE (Oxy IR/ROXICODONE) immediate release tablet 15 mg (has no administration in time range)  iohexol (OMNIPAQUE) 350 MG/ML injection 100 mL (100 mLs Intravenous Contrast Given 10/18/18 1245)    Mobility walks Low fall risk   Focused Assessments    R Recommendations: See Admitting Provider Note  Report given to:   Additional Notes:  [phone]

## 2018-10-18 NOTE — ED Triage Notes (Signed)
Pt states that he has been having double vision in his right eye for several minutes to a half hour when changing position for several days. Pt states this problem corrects itself when he closes the other eye. Pt also c/o headaches in rear of head and neck. Pt c/o extremely weak legs and feet since Sunday. Pt has also been very fatigued, along with balance problems.

## 2018-10-18 NOTE — ED Notes (Signed)
Pt is alert and oriented x 4 and is verbally responsive. Pt reports that he " I just feel fatigue". Pt reports that he has had increased weakness and legs have been too week to ambulate.

## 2018-10-18 NOTE — ED Provider Notes (Signed)
[facility] DEPT Provider Note   CSN: [id number] Arrival date & time: 10/18/18  1044    History   Chief Complaint Chief Complaint  Patient presents with  . Fatigue  . Headache  . Diplopia    HPI Ronald [name] is a 78 y.o. male.     HPI Patient presents with fatigue and vision changes.  States over the last week he has been feeling generally weak.  States it feels particularly bad in his legs.  States he feels unsteady sometimes when he walks.  States he is had a headache on the back of his head.  States sometimes when he stands up and other times when he turns his head quickly he will get double vision.  States that will resolve when he closes 1 of his eyes.  It will last for a few minutes and then resolve on his own usually.  No chest pain.  No cough.  No difficulty speaking.  Has not had symptoms like this before.  Has had good oral intake.  No recent change in medications. Past Medical History:  Diagnosis Date  . Allergic rhinitis   . Anxiety   . BPH (benign prostatic hyperplasia)   . Chronic pain syndrome   . Hypertension   . Lumbar radicular pain   . Major depressive disorder   . Mixed hyperlipidemia   . Neuropathy   . Sleep apnea     Patient Active Problem List   Diagnosis Date Noted  . Atypical chest pain 12/25/2017  . Dyslipidemia 12/25/2017  . Obstructive sleep apnea 12/25/2017  . Acute respiratory failure with hypoxia and hypercarbia ([place]) 11/02/2015  . Chronic pain syndrome 11/02/2015  . Polypharmacy 11/02/2015  . HTN (hypertension) 11/06/2012    Past Surgical History:  Procedure Laterality Date  . BACK SURGERY    . CERVICAL FUSION    . colonoscopy     reported as normal  . LUMBAR NERVE STIMLATOR INSERTION    . THYROIDECTOMY          Home Medications    Prior to Admission medications   Medication Sig Start Date End Date Taking? Authorizing Provider  amLODipine (NORVASC) 5 MG tablet Take 5 mg by mouth daily.    Yes [provider]  DULoxetine (CYMBALTA) 60 MG capsule Take 60 mg by mouth daily.   Yes [provider]  furosemide (LASIX) 20 MG tablet Take 20 mg by mouth daily.  01/19/17  Yes [provider]  gabapentin (NEURONTIN) 300 MG capsule Take 300 mg by mouth 3 (three) times daily as needed (pain).    Yes [provider]  losartan (COZAAR) 25 MG tablet Take 1 tablet (25 mg total) by mouth daily. 11/07/12  Yes Gerda Diss, DO  Multiple Vitamins-Minerals (MULTIVITAMIN WITH MINERALS) tablet Take 1 tablet by mouth daily.   Yes [provider]  oxyCODONE (ROXICODONE) 15 MG immediate release tablet Take 15 mg by mouth every 4 (four) hours as needed for pain. Up to 7 tablets in a day.   Yes [provider]  rOPINIRole (REQUIP) 0.5 MG tablet Take 1 mg by mouth at bedtime. 12/03/17  Yes [provider]  tamsulosin (FLOMAX) 0.4 MG CAPS Take 0.4 mg by mouth 2 (two) times a day.    Yes [provider]  aspirin EC 81 MG EC tablet Take 1 tablet (81 mg total) by mouth daily. Patient not taking: Reported on 10/18/2018 11/07/12   Gerda Diss, DO  Family History Family History  Problem Relation Age of Onset  . Coronary artery disease Father 22       CABG  . Hypertension Father   . Diabetes Father     Social History Social History   Tobacco Use  . Smoking status: Never Smoker  . Smokeless tobacco: Never Used  Substance Use Topics  . Alcohol use: No  . Drug use: No     Allergies   Phenobarbital   Review of Systems Review of Systems  Constitutional: Positive for fatigue. Negative for appetite change and fever.  HENT: Negative for congestion.   Eyes: Positive for visual disturbance.  Respiratory: Negative for chest tightness.   Cardiovascular: Negative for chest pain.  Gastrointestinal: Negative for abdominal distention.  Musculoskeletal: Negative for back pain.  Skin: Negative for rash.  Neurological: Positive for  weakness and headaches. Negative for numbness.  Psychiatric/Behavioral: Negative for confusion.     Physical Exam Updated Vital Signs BP (!) 167/77 (BP Location: Left Arm)   Pulse 69   Temp 98.3 F (36.8 C) (Oral)   Resp 18   Ht 5' 6.5" (1.689 m)   Wt 95.3 kg   SpO2 94%   BMI 33.39 kg/m   Physical Exam Vitals signs and nursing note reviewed.  HENT:     Head: Normocephalic and atraumatic.  Eyes:     Extraocular Movements: Extraocular movements intact.     Pupils: Pupils are equal, round, and reactive to light.     Comments: Patient with conjugate gaze.  Eye movements intact.  No diplopia.  Neck:     Musculoskeletal: Neck supple.  Cardiovascular:     Rate and Rhythm: Normal rate and regular rhythm.  Pulmonary:     Breath sounds: Normal breath sounds.  Abdominal:     Palpations: Abdomen is soft.  Skin:    General: Skin is warm.  Neurological:     Mental Status: He is alert and oriented to person, place, and time.     Comments: Finger-nose intact bilaterally.  Heel shin intact bilaterally.  Good straight leg bilaterally.  Normal gait.  No double vision.  Eye moves intact.  Visual fields intact by confrontation.  Psychiatric:        Mood and Affect: Mood normal.      ED Treatments / Results  Labs (all labs ordered are listed, but only abnormal results are displayed) Labs Reviewed  CBC - Abnormal; Notable for the following components:      Result Value   RBC 3.78 (*)    Hemoglobin 12.5 (*)    HCT 36.8 (*)    All other components within normal limits  URINALYSIS, ROUTINE W REFLEX MICROSCOPIC - Abnormal; Notable for the following components:   APPearance HAZY (*)    All other components within normal limits  APTT - Abnormal; Notable for the following components:   aPTT 38 (*)    All other components within normal limits  COMPREHENSIVE METABOLIC PANEL - Abnormal; Notable for the following components:   Sodium 132 (*)    Potassium 3.4 (*)    Chloride 97 (*)     Glucose, Bld 123 (*)    BUN 30 (*)    Creatinine, Ser 1.34 (*)    Calcium 8.8 (*)    GFR calc non Af Amer 50 (*)    GFR calc Af Amer 58 (*)    All other components within normal limits  RAPID URINE DRUG SCREEN, HOSP PERFORMED - Abnormal; Notable for the  following components:   Opiates POSITIVE (*)    All other components within normal limits  CBG MONITORING, ED - Abnormal; Notable for the following components:   Glucose-Capillary 142 (*)    All other components within normal limits  I-STAT CHEM 8, ED - Abnormal; Notable for the following components:   BUN 28 (*)    Creatinine, Ser 1.40 (*)    Glucose, Bld 124 (*)    Hemoglobin 12.6 (*)    HCT 37.0 (*)    All other components within normal limits  SARS CORONAVIRUS 2 (HOSPITAL ORDER, [facility])  ETHANOL  PROTIME-INR    EKG EKG Interpretation  Date/Time:  Friday October 18 2018 10:54:39 EDT Ventricular Rate:  77 PR Interval:    QRS Duration: 96 QT Interval:  382 QTC Calculation: 433 R Axis:   69 Text Interpretation:  Sinus rhythm Short PR interval Confirmed by Davonna Belling [phone]) on 10/18/2018 11:45:22 AM   Radiology Ct Angio Head W Or Wo Contrast  Result Date: 10/18/2018 CLINICAL DATA:  78 year old male with intermittent diplopia for several days. EXAM: CT ANGIOGRAPHY HEAD AND NECK TECHNIQUE: Multidetector CT imaging of the head and neck was performed using the standard protocol during bolus administration of intravenous contrast. Multiplanar CT image reconstructions and MIPs were obtained to evaluate the vascular anatomy. Carotid stenosis measurements (when applicable) are obtained utilizing NASCET criteria, using the distal internal carotid diameter as the denominator. CONTRAST:  159mL OMNIPAQUE IOHEXOL 350 MG/ML SOLN COMPARISON:  Brain MRI 07/28/2006. Head CT 11/02/2015. FINDINGS: CT HEAD Brain: Stable cerebral volume since 2017. 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 Stable gray-white matter differentiation throughout the brain. No midline shift, ventriculomegaly, mass effect, evidence of mass lesion, intracranial hemorrhage or evidence of cortically based acute infarction. Normal suprasellar cistern. Calvarium and skull base: No acute osseous abnormality identified. Paranasal sinuses: New left sphenoid sinus mucosal thickening. Other paranasal sinuses and mastoids are stable and well pneumatized. Orbits: Orbits soft tissues appears stable and negative. Negative scalp soft tissues. 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 There is also incomplete segmentation in the lower cervical spine at C6-C7. No acute osseous abnormality identified. Upper chest: Negative. Other neck: Grossly negative neck soft tissues. Aortic arch: Calcified aortic atherosclerosis. 3 vessel arch configuration. Right carotid system: Mild soft plaque in the right CCA proximal to the bifurcation without stenosis. Soft and calcified plaque at the right ICA origin and bulb appears mildly ulcerated (series 14, image 62) but there is less than 50% stenosis with respect to the distal vessel. Left carotid system: No left CCA stenosis despite mild soft plaque. Soft and calcified plaque at the left ICA origin and bulb with up to 50% stenosis with respect to the distal vessel. Vertebral arteries: No proximal right subclavian artery stenosis despite calcified plaque. Mild plaque and stenosis at the right vertebral artery origin. Otherwise patent right vertebral artery to the skull base without stenosis. No proximal left subclavian artery stenosis. Normal left vertebral artery origin. Tortuous left V1 segment. Mild left V3 calcified plaque. Mildly dominant and patent left vertebral artery to the skull base without stenosis. CTA  HEAD Posterior circulation: Mildly dominant left vertebral artery. Right V4 segment calcified plaque with only mild stenosis. No distal left vertebral stenosis. Patent PICA origins and vertebrobasilar junction. Patent  basilar artery without stenosis. Patent AICA, SCA and PCA origins. Mild left PCA irregularity without stenosis. There is severe right PCA stenosis at the P1/P2 junction (series 17, image 16) but distal right PCA branches remain normal. Posterior communicating arteries are diminutive or absent. Anterior circulation: Both ICA siphons are patent. For both siphons are mildly dolichoectatic. Mild calcified plaque on the left without stenosis. Mild to moderate calcified plaque on the right without stenosis. The right ophthalmic artery origin is normal. The left is not identified. Patent carotid termini. Normal MCA and ACA origins. Anterior communicating artery and bilateral ACA branches are within normal limits. Left MCA M1 segment, bifurcation, and left MCA branches are within normal limits. Right MCA M1 segment, bifurcation, and right MCA branches are within normal limits. Venous sinuses: Patent. Anatomic variants: Mildly dominant left vertebral artery. Review of the MIP images confirms the above findings IMPRESSION: 1. 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 Are there left side vision symptoms to suggest right PCA territory ischemia? 2. Arterial tortuosity and atherosclerosis in the head and neck with no other significant stenosis and no large vessel occlusion identified. 3. Stable noncontrast CT appearance of the brain since 2017. No acute cortically based infarct or intracranial hemorrhage. 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 4. 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 Electronically Signed   By: Genevie Ann  M.D.   On: 10/18/2018 13:37   Dg Chest 2 View  Result Date: 10/18/2018 CLINICAL DATA:  Double vision. EXAM: CHEST - 2 VIEW COMPARISON:  CT 12/27/2017.  Chest x-ray 11/02/2015. FINDINGS: Mediastinum hilar structures are normal. Mild bibasilar atelectasis. No acute infiltrate. No pleural effusion or pneumothorax. Stable mild cardiomegaly. No pulmonary venous congestion. Cervicothoracic spine scoliosis and degenerative change. Neurostimulator noted in stable position over the thoracic spine. Carotid vascular calcification. IMPRESSION: 1.  Stable cardiomegaly.  No pulmonary venous congestion. 2.  Mild bibasilar atelectasis. 3. Cervicothoracic spine scoliosis and degenerative change. Neurostimulator noted in stable position over thoracic spine. 4.  Carotid vascular disease. Electronically Signed   By: Marcello Moores  Register   On: 10/18/2018 12:01   Ct Angio Neck W And/or Wo Contrast  Result Date: 10/18/2018 CLINICAL DATA:  78 year old male with intermittent diplopia for several days. EXAM: CT ANGIOGRAPHY HEAD AND NECK TECHNIQUE: Multidetector CT imaging of the head and neck was performed using the standard protocol during bolus administration of intravenous contrast. Multiplanar CT image reconstructions and MIPs were obtained to evaluate the vascular anatomy. Carotid stenosis measurements (when applicable) are obtained utilizing NASCET criteria, using the distal internal carotid diameter as the denominator. CONTRAST:  139mL OMNIPAQUE IOHEXOL 350 MG/ML SOLN COMPARISON:  Brain MRI 07/28/2006. Head CT 11/02/2015. FINDINGS: CT HEAD Brain: Stable cerebral volume since 2017. 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 Stable gray-white matter differentiation throughout the brain. No midline shift, ventriculomegaly, mass effect, evidence of mass lesion, intracranial hemorrhage or evidence of cortically based acute infarction.  Normal suprasellar cistern. Calvarium and skull base: No acute osseous abnormality identified. Paranasal sinuses: New left sphenoid sinus mucosal thickening. Other paranasal sinuses and mastoids are stable and well pneumatized. Orbits: Orbits soft tissues appears stable and negative. Negative scalp soft tissues. CTA NECK Skeleton: Advanced degenerative changes at the craniocervical junction appear related to chronic or congenital ankylosis of C1 to  the skull base. There is also incomplete segmentation in the lower cervical spine at C6-C7. No acute osseous abnormality identified. Upper chest: Negative. Other neck: Grossly negative neck soft tissues. Aortic arch: Calcified aortic atherosclerosis. 3 vessel arch configuration. Right carotid system: Mild soft plaque in the right CCA proximal to the bifurcation without stenosis. Soft and calcified plaque at the right ICA origin and bulb appears mildly ulcerated (series 14, image 62) but there is less than 50% stenosis with respect to the distal vessel. Left carotid system: No left CCA stenosis despite mild soft plaque. Soft and calcified plaque at the left ICA origin and bulb with up to 50% stenosis with respect to the distal vessel. Vertebral arteries: No proximal right subclavian artery stenosis despite calcified plaque. Mild plaque and stenosis at the right vertebral artery origin. Otherwise patent right vertebral artery to the skull base without stenosis. No proximal left subclavian artery stenosis. Normal left vertebral artery origin. Tortuous left V1 segment. Mild left V3 calcified plaque. Mildly dominant and patent left vertebral artery to the skull base without stenosis. CTA HEAD Posterior circulation: Mildly dominant left vertebral artery. Right V4 segment calcified plaque with only mild stenosis. No distal left vertebral stenosis. Patent PICA origins and vertebrobasilar junction. Patent basilar artery without stenosis. Patent AICA, SCA and PCA origins. Mild left  PCA irregularity without stenosis. There is severe right PCA stenosis at the P1/P2 junction (series 17, image 16) but distal right PCA branches remain normal. Posterior communicating arteries are diminutive or absent. Anterior circulation: Both ICA siphons are patent. For both siphons are mildly dolichoectatic. Mild calcified plaque on the left without stenosis. Mild to moderate calcified plaque on the right without stenosis. The right ophthalmic artery origin is normal. The left is not identified. Patent carotid termini. Normal MCA and ACA origins. Anterior communicating artery and bilateral ACA branches are within normal limits. Left MCA M1 segment, bifurcation, and left MCA branches are within normal limits. Right MCA M1 segment, bifurcation, and right MCA branches are within normal limits. Venous sinuses: Patent. Anatomic variants: Mildly dominant left vertebral artery. Review of the MIP images confirms the above findings IMPRESSION: 1. 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 Are there left side vision symptoms to suggest right PCA territory ischemia? 2. Arterial tortuosity and atherosclerosis in the head and neck with no other significant stenosis and no large vessel occlusion identified. 3. Stable noncontrast CT appearance of the brain since 2017. No acute cortically based infarct or intracranial hemorrhage. 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 4. 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 Electronically Signed   By: Genevie Ann M.D.   On: 10/18/2018 13:37    Procedures Procedures (including critical care time)  Medications Ordered in ED Medications  sodium chloride flush (NS) 0.9 % injection 3 mL (has no administration in time range)  sodium chloride (PF) 0.9 % injection (has no administration in time range)   iohexol (OMNIPAQUE) 350 MG/ML injection 100 mL (100 mLs Intravenous Contrast Given 10/18/18 1245)     Initial Impression / Assessment and Plan / ED Course  I have reviewed the triage vital signs and the nursing notes.  Pertinent labs & imaging results that were available during my care of the patient were reviewed by me and considered in my medical decision making (see chart for details).        Patient  with double vision and generalized weakness.  Has been episodes over the last week.  States they get worse when he stands up at times or when he turns his head.  Head CT does show some vascular abnormalities but no clear stroke.  Had does have a spinal stimulator so likely cannot get an MRI.  However with recurrent neuro deficits I feel patient benefit from admission to the hospital.  Has some worsening kidney function.  Initially wanted to leave AMA and follow-up as an outpatient but now is willing to be admitted.   Final Clinical Impressions(s) / ED Diagnoses   Final diagnoses:  Diplopia    ED Discharge Orders    None       Davonna Belling, MD 10/18/18 1440

## 2018-10-18 NOTE — ED Notes (Signed)
[facility] called/ report called to [facility].

## 2018-10-18 NOTE — Consult Note (Addendum)
Neurology Consultation  Reason for Consult: Diplopia Referring Physician: Dr. Maylene Roes  CC: Transient diplopia  History is obtained from: Patient  HPI: Ronald [name] is a 78 y.o. male with sleep apnea, neuropathy, hyperlipidemia, lumbar radicular pain, hypertension.  Patient states over the past 3 days has been having intermittent queued diplopia on the right side of his vision.  He says it only last for a few seconds but then goes away.  He often notes that it occurs when he is moving his body in a position usually to the right.  He has not had any of these problems while in the hospital.  He denies any eye pain, headache afterwards, facial pain, blurred or lack of vision, numbness, tingling, dysarthria, aphasia.  He does state that he has been having a headache over the past couple days but it is very minor and is constant in the back of his head.  He states he has had this issue before.  He went to the ophthalmologist and he told him it would go away in 2 days.  He does not call exactly what the ophthalmologist said.    LKW: Not specific but about 3 days ago tpa given?: no, having no symptoms Premorbid modified Rankin scale (mRS): 0 NIH stroke scale 0  ROS: A 14 point ROS was performed and is negative except as noted in the HPI.   Past Medical History:  Diagnosis Date  . Allergic rhinitis   . Anxiety   . BPH (benign prostatic hyperplasia)   . Chronic pain syndrome   . Hypertension   . Lumbar radicular pain   . Major depressive disorder   . Mixed hyperlipidemia   . Neuropathy   . Sleep apnea     Family History  Problem Relation Age of Onset  . Coronary artery disease Father 8       CABG  . Hypertension Father   . Diabetes Father     Social History:   reports that he has never smoked. He has never used smokeless tobacco. He reports that he does not drink alcohol or use drugs.  Medications  Current Facility-Administered Medications:  .  oxyCODONE (Oxy IR/ROXICODONE)  immediate release tablet 15 mg, 15 mg, Oral, Q4H PRN, Dessa Phi, DO .  sodium chloride (PF) 0.9 % injection, , , ,  .  sodium chloride flush (NS) 0.9 % injection 3 mL, 3 mL, Intravenous, Once, Davonna Belling, MD  Current Outpatient Medications:  .  amLODipine (NORVASC) 5 MG tablet, Take 5 mg by mouth daily., Disp: , Rfl:  .  DULoxetine (CYMBALTA) 60 MG capsule, Take 60 mg by mouth daily., Disp: , Rfl:  .  furosemide (LASIX) 20 MG tablet, Take 20 mg by mouth daily. , Disp: , Rfl:  .  gabapentin (NEURONTIN) 300 MG capsule, Take 300 mg by mouth 3 (three) times daily as needed (pain). , Disp: , Rfl:  .  losartan (COZAAR) 25 MG tablet, Take 1 tablet (25 mg total) by mouth daily., Disp: 30 tablet, Rfl: 1 .  Multiple Vitamins-Minerals (MULTIVITAMIN WITH MINERALS) tablet, Take 1 tablet by mouth daily., Disp: , Rfl:  .  oxyCODONE (ROXICODONE) 15 MG immediate release tablet, Take 15 mg by mouth every 4 (four) hours as needed for pain. Up to 7 tablets in a day., Disp: , Rfl:  .  rOPINIRole (REQUIP) 0.5 MG tablet, Take 1 mg by mouth at bedtime., Disp: , Rfl: 2 .  tamsulosin (FLOMAX) 0.4 MG CAPS, Take 0.4 mg by  mouth 2 (two) times a day. , Disp: , Rfl:  .  aspirin EC 81 MG EC tablet, Take 1 tablet (81 mg total) by mouth daily. (Patient not taking: Reported on 10/18/2018), Disp: , Rfl:    Exam: Current vital signs: BP (!) 175/81 (BP Location: Left Arm)   Pulse 73   Temp 98.3 F (36.8 C) (Oral)   Resp 18   Ht 5' 6.5" (1.689 m)   Wt 95.3 kg   SpO2 95%   BMI 33.39 kg/m  Vital signs in last 24 hours: Temp:  [98.3 F (36.8 C)] 98.3 F (36.8 C) (06/05 1053) Pulse Rate:  [67-81] 73 (06/05 1613) Resp:  [10-20] 18 (06/05 1613) BP: (132-175)/(68-90) 175/81 (06/05 1613) SpO2:  [93 %-99 %] 95 % (06/05 1613) Weight:  [95.3 kg] 95.3 kg (06/05 1054)  Physical Exam  Constitutional: Appears well-developed and well-nourished.  Psych: Affect appropriate to situation Eyes: No scleral injection HENT:  No OP obstrucion Head: Normocephalic.  Cardiovascular: Normal rate and regular rhythm.  Respiratory: Effort normal, non-labored breathing GI: Soft.  No distension. There is no tenderness.  Skin: WDI  Neuro: Mental Status: Patient is awake, alert, oriented to person, place, month, year, and situation. Patient is able to give a clear and coherent history. No signs of aphasia or neglect Cranial Nerves: II: Visual Fields are full.  III,IV, VI: EOMI without ptosis or diploplia. Pupils equal, round and reactive to light V: Facial sensation is symmetric to temperature VII: Facial movement is symmetric.  VIII: hearing is intact to voice X: Palat elevates symmetrically XI: Shoulder shrug is symmetric. XII: tongue is midline without atrophy or fasciculations.  Motor: Tone is normal. Bulk is normal. 5/5 strength was present in all four extremities.  Sensory: Sensation is symmetric to light touch and temperature in the arms and legs. Deep Tendon Reflexes: 2+ and symmetric in the biceps and patellae.  Plantars: Toes are downgoing bilaterally.  Cerebellar: FNF and HKS are intact bilaterally  Labs I have reviewed labs in epic and the results pertinent to this consultation are:   CBC    Component Value Date/Time   WBC 5.1 10/18/2018 1126   RBC 3.78 (L) 10/18/2018 1126   HGB 12.6 (L) 10/18/2018 1145   HCT 37.0 (L) 10/18/2018 1145   PLT 188 10/18/2018 1126   MCV 97.4 10/18/2018 1126   MCH 33.1 10/18/2018 1126   MCHC 34.0 10/18/2018 1126   RDW 13.7 10/18/2018 1126   LYMPHSABS 0.5 (L) 11/02/2015 1033   MONOABS 0.6 11/02/2015 1033   EOSABS 0.1 11/02/2015 1033   BASOSABS 0.0 11/02/2015 1033    CMP     Component Value Date/Time   NA 138 10/18/2018 1145   NA 139 12/25/2017 0924   K 3.6 10/18/2018 1145   CL 102 10/18/2018 1145   CO2 28 10/18/2018 1126   GLUCOSE 124 (H) 10/18/2018 1145   BUN 28 (H) 10/18/2018 1145   BUN 16 12/25/2017 0924   CREATININE 1.40 (H) 10/18/2018 1145    CALCIUM 8.8 (L) 10/18/2018 1126   PROT 7.8 10/18/2018 1126   ALBUMIN 4.7 10/18/2018 1126   AST 18 10/18/2018 1126   ALT 22 10/18/2018 1126   ALKPHOS 73 10/18/2018 1126   BILITOT 0.5 10/18/2018 1126   GFRNONAA 50 (L) 10/18/2018 1126   GFRAA 58 (L) 10/18/2018 1126    Lipid Panel     Component Value Date/Time   CHOL 216 (H) 11/06/2012 0520   TRIG 138 11/06/2012 0520   HDL  48 11/06/2012 0520   CHOLHDL 4.5 11/06/2012 0520   VLDL 28 11/06/2012 0520   LDLCALC 140 (H) 11/06/2012 0520     Imaging I have reviewed the images obtained:  CT Angie head and neck- positive for 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  Arterial tortuosity and atherosclerosis in the head and neck with no other significant stenosis in the large vessel occlusion.  Stable noncontrast CT appearance of the brain since 2017  MRI examination of the brain-pending  Etta Quill PA-C [facility] [phone]  M-F  (9:00 am- 5:00 PM)  10/18/2018, 4:33 PM      Impression: -Diplopia -Hyperlipidemia - Headache  Recommend # MRI of the brain without contrast #Transthoracic Echo, may need TEE and loop monitor  # Start patient on ASA 325mg  daily  #Start or continue Atorvastatin 80 mg/other high intensity statin # BP goal: permissive HTN upto 220/120 mmHg # HBAIC and Lipid profile # Telemetry monitoring # Frequent neuro checks # NPO until passes stroke swallow screen # please page stroke NP  Or  PA  Or MD from 8am -4 pm  as this patient from this time will be  followed by the stroke.   You can look them up on [email or web address]  Password [email or web address]   NEUROHOSPITALIST ADDENDUM Performed a face to face diagnostic evaluation.   I have reviewed the contents of history and physical exam as documented by PA/ARNP/Resident and agree with above documentation.  I have discussed and formulated the above plan as documented. Edits to the note have been made as  needed.  Intermittent double binocular double vision,mostly looking towards right side as well as gait imbalance for last 2-3 days.  CTA shows severe R PCA stenosis, do not suspect this would cause patients symptoms. Possible small vessel disease.  MRI Aaron Edelman to r./o stroke, will need to touch base the spinal stimulator rep.   D/D also includes Myasthenia  (low suspicion), basilar migraine- however symptoms have been occuring only for few days.   Karena Addison Aroor MD [facility] [phone]   If 7pm to 7am, please call on call as listed on AMION.

## 2018-10-18 NOTE — Discharge Instructions (Signed)
Your double vision could be due to something such as a stroke.  Feel free to return for further work-up at any time.  He also look like he could be a little dehydrated.  Follow-up with your doctor soon as possible.

## 2018-10-18 NOTE — ED Notes (Signed)
Pt states that he is unable to provide urine sample at this time.

## 2018-10-19 DIAGNOSIS — I10 Essential (primary) hypertension: Secondary | ICD-10-CM | POA: Diagnosis not present

## 2018-10-19 DIAGNOSIS — H532 Diplopia: Secondary | ICD-10-CM | POA: Diagnosis not present

## 2018-10-19 LAB — HEMOGLOBIN A1C
Hgb A1c MFr Bld: 5.9 % — ABNORMAL HIGH (ref 4.8–5.6)
Mean Plasma Glucose: 122.63 mg/dL

## 2018-10-19 LAB — BASIC METABOLIC PANEL
Anion gap: 12 (ref 5–15)
BUN: 19 mg/dL (ref 8–23)
CO2: 26 mmol/L (ref 22–32)
Calcium: 9.3 mg/dL (ref 8.9–10.3)
Chloride: 103 mmol/L (ref 98–111)
Creatinine, Ser: 1.13 mg/dL (ref 0.61–1.24)
GFR calc Af Amer: >60 mL/min (ref >60)
GFR calc non Af Amer: >60 mL/min (ref >60)
Glucose, Bld: 97 mg/dL (ref 70–99)
Potassium: 3.7 mmol/L (ref 3.5–5.1)
Sodium: 141 mmol/L (ref 135–145)

## 2018-10-19 LAB — CBC
HCT: 36.8 % — ABNORMAL LOW (ref 39.0–52.0)
Hemoglobin: 12.6 g/dL — ABNORMAL LOW (ref 13.0–17.0)
MCH: 32.1 pg (ref 26.0–34.0)
MCHC: 34.2 g/dL (ref 30.0–36.0)
MCV: 93.9 fL (ref 80.0–100.0)
Platelets: 206 10*3/uL (ref 150–400)
RBC: 3.92 MIL/uL — ABNORMAL LOW (ref 4.22–5.81)
RDW: 13.6 % (ref 11.5–15.5)
WBC: 6.6 10*3/uL (ref 4.0–10.5)
nRBC: 0 % (ref 0.0–0.2)

## 2018-10-19 LAB — LIPID PANEL
Cholesterol: 259 mg/dL — ABNORMAL HIGH (ref 0–200)
HDL: 46 mg/dL (ref >40)
LDL Cholesterol: 159 mg/dL — ABNORMAL HIGH (ref 0–99)
Total CHOL/HDL Ratio: 5.6 RATIO
Triglycerides: 272 mg/dL — ABNORMAL HIGH (ref <150)
VLDL: 54 mg/dL — ABNORMAL HIGH (ref 0–40)

## 2018-10-19 MED ORDER — CLOPIDOGREL BISULFATE 75 MG PO TABS
75.0000 mg | ORAL_TABLET | Freq: Every day | ORAL | Status: DC
  Administered 2018-10-19: 11:00:00 75 mg via ORAL
  Filled 2018-10-19: qty 1

## 2018-10-19 MED ORDER — OXYCODONE HCL 5 MG PO TABS
15.0000 mg | ORAL_TABLET | ORAL | Status: DC
  Administered 2018-10-19 (×2): 15 mg via ORAL
  Filled 2018-10-19 (×2): qty 3

## 2018-10-19 MED ORDER — CLOPIDOGREL BISULFATE 75 MG PO TABS: 75.0000 mg | ORAL_TABLET | Freq: Every day | ORAL | 0 refills | Status: DC

## 2018-10-19 MED ORDER — ATORVASTATIN CALCIUM 40 MG PO TABS: 40.0000 mg | ORAL_TABLET | Freq: Every day | ORAL | Status: DC

## 2018-10-19 MED ORDER — ATORVASTATIN CALCIUM 40 MG PO TABS: 40.0000 mg | ORAL_TABLET | Freq: Every day | ORAL | 0 refills | Status: DC

## 2018-10-19 MED ORDER — ASPIRIN EC 81 MG PO TBEC
81.0000 mg | DELAYED_RELEASE_TABLET | Freq: Every day | ORAL | Status: DC
  Administered 2018-10-19: 81 mg via ORAL
  Filled 2018-10-19: qty 1

## 2018-10-19 NOTE — Progress Notes (Signed)
SLP Cancellation Note  Patient Details Name: Ronald [name] MRN: [id number] DOB: Apr 19, 1941   Cancelled treatment:       Reason Eval/Treat Not Completed: Other (comment) SLP swallow eval ordered, pt passed Yale swallow protocol and was started on diet appropriately. Will sign off.    Dera Vanaken, Katherene Ponto 10/19/2018, 7:38 AM

## 2018-10-19 NOTE — Care Management CC44 (Signed)
Condition Code 44 Documentation Completed  Patient Details  Name: AVYAY COGER MRN: [id number] Date of Birth: 07/16/1940   Condition Code 44 given:  Yes Patient signature on Condition Code 44 notice:  Yes Documentation of 2 MD's agreement:  Yes Code 44 added to claim:  Yes    Carles Collet, RN 10/19/2018, 2:24 PM

## 2018-10-19 NOTE — Progress Notes (Signed)
OT Cancellation Note  Patient Details Name: LANDY DUNNAVANT MRN: [id number] DOB: 1940-11-04   Cancelled Treatment:    Reason Eval/Treat Not Completed: OT screened, no needs identified, will sign off. Per chart review and conversation with pt, he is back to baseline from OT standpoint.   Tyrone Schimke, OT Acute Rehabilitation Services Pager: [phone] Office: [phone]  10/19/2018, 1:34 PM

## 2018-10-19 NOTE — Care Management Obs Status (Signed)
[facility] NOTIFICATION   Patient Details  Name: Ronald [name] MRN: [id number] Date of Birth: 1940/06/07   Medicare Observation Status Notification Given:  Yes    Carles Collet, RN 10/19/2018, 2:24 PM

## 2018-10-19 NOTE — Plan of Care (Signed)
Adequate for discharge.

## 2018-10-19 NOTE — Evaluation (Signed)
Physical Therapy Evaluation and Discharge Patient Details Name: Ronald [name] MRN: [id number] DOB: 06-04-1940 Today's Date: 10/19/2018   History of Present Illness  Pt is an 78 y/o male admitted secondary to diplopia and weakness in BLE. CTA revealed R PCA stenosis. Pt unable to get MRI. PMH includes HTN and neuropathy.   Clinical Impression  Patient evaluated by Physical Therapy with no further acute PT needs identified. All education has been completed and the patient has no further questions. Pt overall steady with gait and stair navigation this session. Required gross min guard to supervision for safety and no LOB noted. Pt reports some "tiredness" in LEs, however, reports that is baseline. Pt reports he is at his baseline with ambulation. See below for any follow-up Physical Therapy or equipment needs. PT is signing off. Thank you for this referral. If needs change, please re-consult.      Follow Up Recommendations No PT follow up    Equipment Recommendations  None recommended by PT    Recommendations for Other Services       Precautions / Restrictions Precautions Precautions: Fall Restrictions Weight Bearing Restrictions: No      Mobility  Bed Mobility Overal bed mobility: Modified Independent                Transfers Overall transfer level: Needs assistance Equipment used: None Transfers: Sit to/from Stand Sit to Stand: Supervision         General transfer comment: Supervision for safety.   Ambulation/Gait Ambulation/Gait assistance: Min guard;Supervision Gait Distance (Feet): 200 Feet Assistive device: None Gait Pattern/deviations: Step-through pattern;Decreased stride length Gait velocity: Decreased    General Gait Details: Slower gait, however, overall steady. Pt reporting legs feel "tired", however, reports this is baseline.   Stairs Stairs: Yes Stairs assistance: Min guard Stair Management: Two rails;Alternating pattern;Forwards Number of  Stairs: 4 General stair comments: Slow, overall steady stair navigation. No LOB noted.   Wheelchair Mobility    Modified Rankin (Stroke Patients Only) Modified Rankin (Stroke Patients Only) Pre-Morbid Rankin Score: No symptoms Modified Rankin: Slight disability     Balance Overall balance assessment: Mild deficits observed, not formally tested                                           Pertinent Vitals/Pain Pain Assessment: No/denies pain    Home Living Family/patient expects to be discharged to:: Private residence Living Arrangements: Spouse/significant other Available Help at Discharge: Family;Available 24 hours/day Type of Home: House Home Access: Stairs to enter Entrance Stairs-Rails: Right;Left;Can reach both Entrance Stairs-Number of Steps: 4 Home Layout: One level Home Equipment: Walker - 2 wheels      Prior Function Level of Independence: Independent               Hand Dominance        Extremity/Trunk Assessment   Upper Extremity Assessment Upper Extremity Assessment: Defer to OT evaluation    Lower Extremity Assessment Lower Extremity Assessment: Generalized weakness(reports weakness and "tiredness" of LEs at baseline)    Cervical / Trunk Assessment Cervical / Trunk Assessment: Other exceptions Cervical / Trunk Exceptions: hx of spinal surgeries.   Communication   Communication: No difficulties  Cognition Arousal/Alertness: Awake/alert Behavior During Therapy: WFL for tasks assessed/performed Overall Cognitive Status: Within Functional Limits for tasks assessed  General Comments General comments (skin integrity, edema, etc.): Educated on "BE FAST" acronym.     Exercises     Assessment/Plan    PT Assessment Patent does not need any further PT services  PT Problem List         PT Treatment Interventions      PT Goals (Current goals can be found in the Care  Plan section)  Acute Rehab PT Goals Patient Stated Goal: to go home today PT Goal Formulation: With patient Time For Goal Achievement: 11/02/18 Potential to Achieve Goals: Good    Frequency     Barriers to discharge        Co-evaluation               AM-PAC PT "6 Clicks" Mobility  Outcome Measure Help needed turning from your back to your side while in a flat bed without using bedrails?: None Help needed moving from lying on your back to sitting on the side of a flat bed without using bedrails?: None Help needed moving to and from a bed to a chair (including a wheelchair)?: None Help needed standing up from a chair using your arms (e.g., wheelchair or bedside chair)?: None Help needed to walk in hospital room?: A Little Help needed climbing 3-5 steps with a railing? : A Little 6 Click Score: 22    End of Session Equipment Utilized During Treatment: Gait belt Activity Tolerance: Patient tolerated treatment well Patient left: in bed;with call bell/phone within reach;with bed alarm set Nurse Communication: Mobility status PT Visit Diagnosis: Other abnormalities of gait and mobility (R26.89);Other symptoms and signs involving the nervous system (R29.898)    Time: 0233-4356 PT Time Calculation (min) (ACUTE ONLY): 21 min   Charges:   PT Evaluation $PT Eval Low Complexity: [place], PT, DPT  Acute Rehabilitation Services  Pager: [phone] Office: [phone]   Rudean Hitt 10/19/2018, 11:03 AM

## 2018-10-19 NOTE — Progress Notes (Signed)
Pt has MR unsafe stimulator, RN aware, Dr. Lorraine Lax aware.

## 2018-10-19 NOTE — Progress Notes (Signed)
STROKE TEAM PROGRESS NOTE   HISTORY OF PRESENT ILLNESS (per Dr. Lorraine Lax) Ronald [name] is a 78 y.o. male with sleep apnea, neuropathy, hyperlipidemia, lumbar radicular pain, hypertension.  Patient states over the past 3 days has been having intermittent binocular diplopia on the right side of his vision.  He says it only last for a few seconds but then goes away.  He often notes that it occurs when he is moving his body in a position usually to the right.  He has not had any of these problems while in the hospital.  He denies any eye pain, headache afterwards, facial pain, blurred or lack of vision, numbness, tingling, dysarthria, aphasia.  He does state that he has been having a headache over the past couple days but it is very minor and is constant in the back of his head.  He states he has had this issue before.  He went to the ophthalmologist and he told him it would go away in 2 days.  He does not call exactly what the ophthalmologist said.    LKW: Not specific but about 3 days ago tpa given?: no, having no symptoms Premorbid modified Rankin scale (mRS): 0 NIH stroke scale 0   SUBJECTIVE (INTERVAL HISTORY) He states that his diplopia and gait ataxia have resolved yesterday.  He is back to his baseline.  He gives prior history of TIA several years ago when he also had diplopia and saw an eye doctor.  He has not been taking any antiplatelet or lipid-lowering therapy at baseline.  LDL cholesterol is elevated.  He cannot have an MRI due to spinal cord stimulator which was implanted 10 years ago by Dr. Ellene Route    OBJECTIVE Vitals:   10/18/18 1745 10/18/18 1840 10/18/18 2012 10/18/18 2240  BP:  (!) 151/83 (!) 155/70 (!) 151/83  Pulse: 72 77 69   Resp:  14 18 16   Temp:   98 F (36.7 C)   TempSrc:   Oral   SpO2: 91% 96% 96%   Weight:      Height:        CBC:  Recent Labs  Lab 10/18/18 1126 10/18/18 1145 10/19/18 0347  WBC 5.1  --  6.6  HGB 12.5* 12.6* 12.6*  HCT 36.8* 37.0*  36.8*  MCV 97.4  --  93.9  PLT 188  --  846    Basic Metabolic Panel:  Recent Labs  Lab 10/18/18 1126 10/18/18 1145 10/19/18 0347  NA 132* 138 141  K 3.4* 3.6 3.7  CL 97* 102 103  CO2 28  --  26  GLUCOSE 123* 124* 97  BUN 30* 28* 19  CREATININE 1.34* 1.40* 1.13  CALCIUM 8.8*  --  9.3    Lipid Panel:     Component Value Date/Time   CHOL 259 (H) 10/19/2018 0347   TRIG 272 (H) 10/19/2018 0347   HDL 46 10/19/2018 0347   CHOLHDL 5.6 10/19/2018 0347   VLDL 54 (H) 10/19/2018 0347   LDLCALC 159 (H) 10/19/2018 0347   HgbA1c:  Lab Results  Component Value Date   HGBA1C 5.9 (H) 10/19/2018   Urine Drug Screen:     Component Value Date/Time   LABOPIA POSITIVE (A) 10/18/2018 1300   COCAINSCRNUR NONE DETECTED 10/18/2018 1300   LABBENZ NONE DETECTED 10/18/2018 1300   AMPHETMU NONE DETECTED 10/18/2018 1300   THCU NONE DETECTED 10/18/2018 1300   LABBARB NONE DETECTED 10/18/2018 1300    Alcohol Level     Component  Value Date/Time   ETH <10 10/18/2018 1126    IMAGING  Ct Angio Head W Or Wo Contrast  Ct Angio Neck W And/or Wo Contrast 10/18/2018 IMPRESSION:  1. 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 Are there left side vision symptoms to suggest right PCA territory ischemia?  2. Arterial tortuosity and atherosclerosis in the head and neck with no other significant stenosis and no large vessel occlusion identified.  3. Stable noncontrast CT appearance of the brain since 2017. No acute cortically based infarct or intracranial hemorrhage. 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  4. 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   Dg Chest 2 View 10/18/2018 IMPRESSION:  1.  Stable cardiomegaly.  No pulmonary venous congestion.  2.  Mild bibasilar atelectasis.  3. Cervicothoracic spine  scoliosis and degenerative change. Neurostimulator noted in stable position over thoracic spine.  4.  Carotid vascular disease.   MRI Brain Wo Contrast -not possible due to spinal cord stimulator     Transthoracic Echocardiogram  10/19/2018 Study Conclusions - Left ventricle: The cavity size was normal. Wall thickness was   normal. Systolic function was normal. The estimated ejection   fraction was in the range of 60% to 65%. Wall motion was normal;   there were no regional wall motion abnormalities. Doppler   parameters are consistent with abnormal left ventricular   relaxation (grade 1 diastolic dysfunction). Impressions: - 1. Normal systolic function. Impaired relaxation.   2. Mild MR.   EKG - SR rate 77 BPM. (See cardiology reading for complete details)    PHYSICAL EXAM Blood pressure (!) 151/83, pulse 69, temperature 98 F (36.7 C), temperature source Oral, resp. rate 16, height 5' 6.5" (1.689 m), weight 95.3 kg, SpO2 96 %. Pleasant elderly Caucasian male not in distress.  . Afebrile. Head is nontraumatic. Neck is supple without bruit.    Cardiac exam no murmur or gallop. Lungs are clear to auscultation. Distal pulses are well felt. Neurological Exam ;  Awake  Alert oriented x 3. Normal speech and language.eye movements full without nystagmus.fundi were not visualized. Vision acuity and fields appear normal. Hearing is normal. Palatal movements are normal. Face symmetric. Tongue midline. Normal strength, tone, reflexes and coordination. Normal sensation. Gait deferred.    ASSESSMENT/PLAN Ronald [name] is a 78 y.o. male with history of sleep apnea, neuropathy, hyperlipidemia, lumbar radicular pain, hypertension  presenting with intermittent diplopia on the right side of his vision.  He did not receive IV t-PA due to late presentation (>4.5 hours from time of onset).  Suspected brainstem TIA versus small infarct not visualized on CT  Resultant no deficits CT head -  Stable noncontrast CT appearance of the brain since 2017. No acute cortically based infarct or intracranial hemorrhage.  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  Carotid Doppler - not performed  2D Echo - EF 60 - 65%. No cardiac source of emboli identified.   Sars Corona Virus 2  - negative  LDL - 159  HgbA1c - 5.9  UDS - opiates  VTE prophylaxis -  Heparin  Diet  - Heart healthy with thin liquids.  No antithrombotic prior to admission, now on aspirin 81  mg daily and clopidogrel 75 mg daily  Patient counseled to be compliant with his antithrombotic medications  Ongoing aggressive stroke risk factor management  Therapy recommendations: None Disposition: Home Hypertension  Stable . Permissive hypertension (OK if < 220/120) but gradually normalize in 5-7 days . 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  F/U Dr Leonie Man in 6 weeks (arrangements made)  Add Lipitor 40 mg daily   Hospital day # 1  I have personally obtained history,examined this patient, reviewed notes, independently viewed imaging studies, participated in medical decision making and plan of care.ROS completed by me personally and pertinent positives fully documented  I have made any additions or clarifications directly to the above note.  He presented  with intermittent vertical diplopia and gait ataxia to the right which lasted several days and appears to have resolved.  CT scan does not show definite stroke but MRI cannot be done due to spinal cord stimulator.  He had previous history of TIA few years ago and does have risk factors of hyperlipidemia and age.  Recommend dual antiplatelet therapy of aspirin 81 mg and Plavix 75 mg daily for 3 weeks followed by aspirin alone.  Add Lipitor 40 mg daily.  Maintain aggressive risk factor modification.  Follow-up as an outpatient in the stroke clinic in 6 weeks.I have spent a total of  25  minutes with the patient reviewing hospital notes,  test results, labs and examining the patient as well as establishing an assessment and plan that was discussed personally with the patient.  > 50% of time was spent in direct patient care.       Antony Contras, MD Medical Director [facility] Pager: [phone] 10/19/2018 11:57 AM   To contact Stroke Continuity provider, please refer to [email or web address] After hours, contact General Neurology

## 2018-10-19 NOTE — Plan of Care (Signed)
Progressing towards discharge 

## 2018-10-19 NOTE — Discharge Summary (Signed)
Physician Discharge Summary  GOR VESTAL WUX:[id number] DOB: 03-Apr-1941 DOA: 10/18/2018  PCP: Mateo Flow, MD  Admit date: 10/18/2018 Discharge date: 10/19/2018  Admitted From: Home  Disposition: Home   Recommendations for Outpatient Follow-up:  1. Follow up with PCP, ophthalmology in 1-2 weeks  Home Health: No Equipment/Devices: None  Discharge Condition: Stable CODE STATUS: Full Diet recommendation: Heart healthy, low salt  Brief/Interim Summary: Ronald [name] is a 78 y.o. male with medical history significant of HTN, chronic pain on oxycodone, BPH, who presents with double vision and weakness.  He admits that he has been feeling unsteady with ambulation, double vision especially when he is moving, specifically when he moves his head. This is intermittent in nature and resolved currently.  He is having no other symptoms, feeling well otherwise. He denies any speech deficits, vision loss, focal weakness or confusion. In the emergency department, labs obtained which revealed potassium 3.4, creatinine 1.34. CTA head and neck positive for severe right PCA stenosis. Unable to obtain MRI due to spine stimulator. Case discussed with neurology, they will consult on patient and recommending patient transferred to [facility].  Admitted as above for subacute onset of diplopia, recurrent.  Neurology following, appreciate insight and recommendations, work-up as below essentially unremarkable other than severe right PCA stenosis noted on CTA.  At this time patient is stable and neurology recommending further outpatient work-up with dual antiplatelet therapy and statin as this cannot be ruled out as a TIA versus CVA setting of stenosis. Echo unremarkable as below. Patient will follow with neurology and pcp as scheduled in the next 1-2 weeks.   Discharge Diagnoses:  Principal Problem:   Diplopia Active Problems:   HTN (hypertension)   Chronic pain syndrome   BPH (benign prostatic  hyperplasia)  Diplopia with severe right PCA stenosis, POA, resolved Cannot rule out brainstem TIA vs small CVA -Neurology consult at [facility] - no further imaging - follow up outpatient -Unable to obtain MRI due to spine stimulator in place -Continue dual antiplatelet therapy with 81 mg aspirin and 75 mg Plavix, statin -TTE as below -PT OT SLP - no indication for assistant device or therapy; regular diet  Chronic back pain, stable -Continue home oxycodone, Cymbalta, Neurontin  Essential hypertension -Resume home medications at discharge  Hyperlipidemia  -Continue new statin as above  CKD stage III, stable -Baseline creatinine around 1.1-1.3 -Stable, continue to monitor  BPH -Continue Flomax  Discharge Instructions  Discharge Instructions    Ambulatory referral to Neurology   Complete by:  As directed    An appointment is requested in approximately 6 weeks     Allergies as of 10/19/2018      Reactions   Phenobarbital Rash      Medication List    TAKE these medications   amLODipine 5 MG tablet Commonly known as:  NORVASC Take 5 mg by mouth daily.   aspirin 81 MG EC tablet Take 1 tablet (81 mg total) by mouth daily.   atorvastatin 40 MG tablet Commonly known as:  LIPITOR Take 1 tablet (40 mg total) by mouth daily at 6 PM.   clopidogrel 75 MG tablet Commonly known as:  PLAVIX Take 1 tablet (75 mg total) by mouth daily. Start taking on:  October 20, 2018   DULoxetine 60 MG capsule Commonly known as:  CYMBALTA Take 60 mg by mouth daily.   furosemide 20 MG tablet Commonly known as:  LASIX Take 20 mg by mouth daily.  gabapentin 300 MG capsule Commonly known as:  NEURONTIN Take 300 mg by mouth 3 (three) times daily as needed (pain).   losartan 25 MG tablet Commonly known as:  COZAAR Take 1 tablet (25 mg total) by mouth daily.   multivitamin with minerals tablet Take 1 tablet by mouth daily.   oxyCODONE 15 MG immediate release  tablet Commonly known as:  ROXICODONE Take 15 mg by mouth every 4 (four) hours as needed for pain. Up to 7 tablets in a day.   rOPINIRole 0.5 MG tablet Commonly known as:  REQUIP Take 1 mg by mouth at bedtime.   tamsulosin 0.4 MG Caps capsule Commonly known as:  FLOMAX Take 0.4 mg by mouth 2 (two) times a day.      Follow-up Information    Schedule an appointment as soon as possible for a visit  with Mateo Flow, MD.   Specialty:  [facility] Contact information: [place] [address] [phone]        Garvin Fila, MD Follow up in 6 week(s).   Specialties:  Neurology, Radiology Why:  Office will call you for appointment Contact information: [address] [place] [address] [phone]          Allergies  Allergen Reactions  . Phenobarbital Rash    Consultations:  Neurology  Procedures/Studies: Ct Angio Head W Or Wo Contrast  Result Date: 10/18/2018 CLINICAL DATA:  78 year old male with intermittent diplopia for several days. EXAM: CT ANGIOGRAPHY HEAD AND NECK TECHNIQUE: Multidetector CT imaging of the head and neck was performed using the standard protocol during bolus administration of intravenous contrast. Multiplanar CT image reconstructions and MIPs were obtained to evaluate the vascular anatomy. Carotid stenosis measurements (when applicable) are obtained utilizing NASCET criteria, using the distal internal carotid diameter as the denominator. CONTRAST:  141mL OMNIPAQUE IOHEXOL 350 MG/ML SOLN COMPARISON:  Brain MRI 07/28/2006. Head CT 11/02/2015. FINDINGS: CT HEAD Brain: Stable cerebral volume since 2017. 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 Stable gray-white matter differentiation throughout the brain. No midline shift, ventriculomegaly, mass effect, evidence of mass lesion, intracranial hemorrhage or evidence of  cortically based acute infarction. Normal suprasellar cistern. Calvarium and skull base: No acute osseous abnormality identified. Paranasal sinuses: New left sphenoid sinus mucosal thickening. Other paranasal sinuses and mastoids are stable and well pneumatized. Orbits: Orbits soft tissues appears stable and negative. Negative scalp soft tissues. 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 There is also incomplete segmentation in the lower cervical spine at C6-C7. No acute osseous abnormality identified. Upper chest: Negative. Other neck: Grossly negative neck soft tissues. Aortic arch: Calcified aortic atherosclerosis. 3 vessel arch configuration. Right carotid system: Mild soft plaque in the right CCA proximal to the bifurcation without stenosis. Soft and calcified plaque at the right ICA origin and bulb appears mildly ulcerated (series 14, image 62) but there is less than 50% stenosis with respect to the distal vessel. Left carotid system: No left CCA stenosis despite mild soft plaque. Soft and calcified plaque at the left ICA origin and bulb with up to 50% stenosis with respect to the distal vessel. Vertebral arteries: No proximal right subclavian artery stenosis despite calcified plaque. Mild plaque and stenosis at the right vertebral artery origin. Otherwise patent right vertebral artery to the skull  base without stenosis. No proximal left subclavian artery stenosis. Normal left vertebral artery origin. Tortuous left V1 segment. Mild left V3 calcified plaque. Mildly dominant and patent left vertebral artery to the skull base without stenosis. CTA HEAD Posterior circulation: Mildly dominant left vertebral artery. Right V4 segment calcified plaque with only mild stenosis. No distal left vertebral stenosis. Patent PICA origins and vertebrobasilar junction. Patent basilar artery without stenosis. Patent  AICA, SCA and PCA origins. Mild left PCA irregularity without stenosis. There is severe right PCA stenosis at the P1/P2 junction (series 17, image 16) but distal right PCA branches remain normal. Posterior communicating arteries are diminutive or absent. Anterior circulation: Both ICA siphons are patent. For both siphons are mildly dolichoectatic. Mild calcified plaque on the left without stenosis. Mild to moderate calcified plaque on the right without stenosis. The right ophthalmic artery origin is normal. The left is not identified. Patent carotid termini. Normal MCA and ACA origins. Anterior communicating artery and bilateral ACA branches are within normal limits. Left MCA M1 segment, bifurcation, and left MCA branches are within normal limits. Right MCA M1 segment, bifurcation, and right MCA branches are within normal limits. Venous sinuses: Patent. Anatomic variants: Mildly dominant left vertebral artery. Review of the MIP images confirms the above findings IMPRESSION: 1. 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 Are there left side vision symptoms to suggest right PCA territory ischemia? 2. Arterial tortuosity and atherosclerosis in the head and neck with no other significant stenosis and no large vessel occlusion identified. 3. Stable noncontrast CT appearance of the brain since 2017. No acute cortically based infarct or intracranial hemorrhage. 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 4. 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 Electronically Signed   By: Genevie Ann M.D.   On: 10/18/2018 13:37   Dg Chest 2 View  Result Date: 10/18/2018 CLINICAL DATA:  Double vision. EXAM: CHEST - 2 VIEW COMPARISON:  CT 12/27/2017.  Chest x-ray 11/02/2015. FINDINGS: Mediastinum hilar structures are normal. Mild bibasilar atelectasis.  No acute infiltrate. No pleural effusion or pneumothorax. Stable mild cardiomegaly. No pulmonary venous congestion. Cervicothoracic spine scoliosis and degenerative change. Neurostimulator noted in stable position over the thoracic spine. Carotid vascular calcification. IMPRESSION: 1.  Stable cardiomegaly.  No pulmonary venous congestion. 2.  Mild bibasilar atelectasis. 3. Cervicothoracic spine scoliosis and degenerative change. Neurostimulator noted in stable position over thoracic spine. 4.  Carotid vascular disease. Electronically Signed   By: Marcello Moores  Register   On: 10/18/2018 12:01   Ct Angio Neck W And/or Wo Contrast  Result Date: 10/18/2018 CLINICAL DATA:  78 year old male with intermittent diplopia for several days. EXAM: CT ANGIOGRAPHY HEAD AND NECK TECHNIQUE: Multidetector CT imaging of the head and neck was performed using the standard protocol during bolus administration of intravenous contrast. Multiplanar CT image reconstructions and MIPs were obtained to evaluate the vascular anatomy. Carotid stenosis measurements (when applicable) are obtained utilizing NASCET criteria, using the distal internal carotid diameter as the denominator. CONTRAST:  155mL OMNIPAQUE IOHEXOL 350 MG/ML SOLN COMPARISON:  Brain MRI 07/28/2006. Head CT 11/02/2015. FINDINGS: CT HEAD Brain: Stable cerebral volume since 2017. 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 Stable gray-white matter differentiation throughout the brain. No midline shift, ventriculomegaly, mass effect, evidence of mass lesion, intracranial hemorrhage or evidence of  cortically based acute infarction. Normal suprasellar cistern. Calvarium and skull base: No acute osseous abnormality identified. Paranasal sinuses: New left sphenoid sinus mucosal thickening. Other paranasal sinuses and mastoids are stable and well pneumatized. Orbits: Orbits soft tissues  appears stable and negative. Negative scalp soft tissues. 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 There is also incomplete segmentation in the lower cervical spine at C6-C7. No acute osseous abnormality identified. Upper chest: Negative. Other neck: Grossly negative neck soft tissues. Aortic arch: Calcified aortic atherosclerosis. 3 vessel arch configuration. Right carotid system: Mild soft plaque in the right CCA proximal to the bifurcation without stenosis. Soft and calcified plaque at the right ICA origin and bulb appears mildly ulcerated (series 14, image 62) but there is less than 50% stenosis with respect to the distal vessel. Left carotid system: No left CCA stenosis despite mild soft plaque. Soft and calcified plaque at the left ICA origin and bulb with up to 50% stenosis with respect to the distal vessel. Vertebral arteries: No proximal right subclavian artery stenosis despite calcified plaque. Mild plaque and stenosis at the right vertebral artery origin. Otherwise patent right vertebral artery to the skull base without stenosis. No proximal left subclavian artery stenosis. Normal left vertebral artery origin. Tortuous left V1 segment. Mild left V3 calcified plaque. Mildly dominant and patent left vertebral artery to the skull base without stenosis. CTA HEAD Posterior circulation: Mildly dominant left vertebral artery. Right V4 segment calcified plaque with only mild stenosis. No distal left vertebral stenosis. Patent PICA origins and vertebrobasilar junction. Patent basilar artery without stenosis. Patent AICA, SCA and PCA origins. Mild left PCA irregularity without stenosis. There is severe right PCA stenosis at the P1/P2 junction (series 17, image 16) but distal right PCA branches remain normal. Posterior communicating arteries are diminutive or absent. Anterior circulation: Both ICA  siphons are patent. For both siphons are mildly dolichoectatic. Mild calcified plaque on the left without stenosis. Mild to moderate calcified plaque on the right without stenosis. The right ophthalmic artery origin is normal. The left is not identified. Patent carotid termini. Normal MCA and ACA origins. Anterior communicating artery and bilateral ACA branches are within normal limits. Left MCA M1 segment, bifurcation, and left MCA branches are within normal limits. Right MCA M1 segment, bifurcation, and right MCA branches are within normal limits. Venous sinuses: Patent. Anatomic variants: Mildly dominant left vertebral artery. Review of the MIP images confirms the above findings IMPRESSION: 1. 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 Are there left side vision symptoms to suggest right PCA territory ischemia? 2. Arterial tortuosity and atherosclerosis in the head and neck with no other significant stenosis and no large vessel occlusion identified. 3. Stable noncontrast CT appearance of the brain since 2017. No acute cortically based infarct or intracranial hemorrhage. 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 4. 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 Electronically Signed   By: Genevie Ann M.D.   On: 10/18/2018 13:37    Subjective: No events or issues overnight; diplopia resolved previously. Denies chest pain, shortness of breath, nausea, vomiting, diarrhea, constipation, headache, fevers, chills.  Discharge Exam: Vitals:   10/18/18 2240 10/19/18 1111  BP: (!) 151/83 (!) 143/72  Pulse:  80  Resp: 16 16  Temp:  98.2 F (36.8 C)  SpO2:  97%   Vitals:   10/18/18 1840 10/18/18 2012 10/18/18 2240 10/19/18 1111  BP: (!) 151/83 (!) 155/70 (!) 151/83 (!) 143/72  Pulse: 77 69  80  Resp: 14 18 16 16   Temp:  98 F  (36.7 C)  98.2 F (36.8 C)  TempSrc:  Oral  Oral  SpO2: 96% 96%  97%  Weight:      Height:        General:  Pleasantly resting in bed, No acute distress. HEENT:  Normocephalic atraumatic.  Sclerae nonicteric, noninjected.  Extraocular movements intact bilaterally. Neck:  Without mass or deformity.  Trachea is midline. Lungs:  Clear to auscultate bilaterally without rhonchi, wheeze, or rales. Heart:  Regular rate and rhythm.  Without murmurs, rubs, or gallops. Abdomen:  Soft, nontender, nondistended.  Without guarding or rebound. Extremities: Without cyanosis, clubbing, edema, or obvious deformity. Vascular:  Dorsalis pedis and posterior tibial pulses palpable bilaterally. Skin:  Warm and dry, no erythema, no ulcerations.   The results of significant diagnostics from this hospitalization (including imaging, microbiology, ancillary and laboratory) are listed below for reference.     Microbiology: Recent Results (from the past 240 hour(s))  SARS Coronavirus 2 (CEPHEID - Performed in [place] hospital lab), Hosp Order     Status: None   Collection Time: 10/18/18 11:26 AM  Result Value Ref Range Status   SARS Coronavirus 2 NEGATIVE NEGATIVE Final    Comment: (NOTE) If result is NEGATIVE SARS-CoV-2 target nucleic acids are NOT DETECTED. The SARS-CoV-2 RNA is generally detectable in upper and lower  respiratory specimens during the acute phase of infection. The lowest  concentration of SARS-CoV-2 viral copies this assay can detect is 250  copies / mL. A negative result does not preclude SARS-CoV-2 infection  and should not be used as the sole basis for treatment or other  patient management decisions.  A negative result may occur with  improper specimen collection / handling, submission of specimen other  than nasopharyngeal swab, presence of viral mutation(s) within the  areas targeted by this assay, and inadequate number of viral copies  (<250 copies / mL). A negative result  must be combined with clinical  observations, patient history, and epidemiological information. If result is POSITIVE SARS-CoV-2 target nucleic acids are DETECTED. The SARS-CoV-2 RNA is generally detectable in upper and lower  respiratory specimens dur ing the acute phase of infection.  Positive  results are indicative of active infection with SARS-CoV-2.  Clinical  correlation with patient history and other diagnostic information is  necessary to determine patient infection status.  Positive results do  not rule out bacterial infection or co-infection with other viruses. If result is PRESUMPTIVE POSTIVE SARS-CoV-2 nucleic acids MAY BE PRESENT.   A presumptive positive result was obtained on the submitted specimen  and confirmed on repeat testing.  While 2019 novel coronavirus  (SARS-CoV-2) nucleic acids may be present in the submitted sample  additional confirmatory testing may be necessary for epidemiological  and / or clinical management purposes  to differentiate between  SARS-CoV-2 and other Sarbecovirus currently known to infect humans.  If clinically indicated additional testing with an alternate test  methodology [phone]) is advised. The SARS-CoV-2 RNA is generally  detectable in upper and lower respiratory sp ecimens during the acute  phase of infection. The expected result is Negative. Fact Sheet for Patients:  StrictlyIdeas.no Fact Sheet for Healthcare Providers: BankingDealers.co.za This test is not yet approved or cleared by the [place] FDA and  has been authorized for detection and/or diagnosis of SARS-CoV-2 by FDA under an Emergency Use Authorization (EUA).  This EUA will remain in effect (meaning this test can be used) for the duration of the COVID-19 declaration under Section 564(b)(1) of the Act, 21 U.S.C. section 360bbb-3(b)(1), unless the authorization is terminated or revoked sooner. Performed at [facility], [place] [address], [place], [place] [address]      Labs: BNP (last 3 results) No results for input(s): BNP in the last 8760 hours. Basic Metabolic Panel: Recent Labs  Lab 10/18/18 1126 10/18/18 1145 10/19/18 0347  NA 132* 138 141  K 3.4* 3.6 3.7  CL 97* 102 103  CO2 28  --  26  GLUCOSE 123* 124* 97  BUN 30* 28* 19  CREATININE 1.34* 1.40* 1.13  CALCIUM 8.8*  --  9.3   Liver Function Tests: Recent Labs  Lab 10/18/18 1126  AST 18  ALT 22  ALKPHOS 73  BILITOT 0.5  PROT 7.8  ALBUMIN 4.7   No results for input(s): LIPASE, AMYLASE in the last 168 hours. No results for input(s): AMMONIA in the last 168 hours. CBC: Recent Labs  Lab 10/18/18 1126 10/18/18 1145 10/19/18 0347  WBC 5.1  --  6.6  HGB 12.5* 12.6* 12.6*  HCT 36.8* 37.0* 36.8*  MCV 97.4  --  93.9  PLT 188  --  206   Cardiac Enzymes: No results for input(s): CKTOTAL, CKMB, CKMBINDEX, TROPONINI in the last 168 hours. BNP: Invalid input(s): POCBNP CBG: Recent Labs  Lab 10/18/18 1112  GLUCAP 142*   D-Dimer No results for input(s): DDIMER in the last 72 hours. Hgb A1c Recent Labs    10/19/18 0347  HGBA1C 5.9*   Lipid Profile Recent Labs    10/19/18 0347  CHOL 259*  HDL 46  LDLCALC 159*  TRIG 272*  CHOLHDL 5.6   Thyroid function studies No results for input(s): TSH, T4TOTAL, T3FREE, THYROIDAB in the last 72 hours.  Invalid input(s): FREET3 Anemia work up No results for input(s): VITAMINB12, FOLATE, FERRITIN, TIBC, IRON, RETICCTPCT in the last 72 hours. Urinalysis    Component Value Date/Time   COLORURINE YELLOW 10/18/2018 1300   APPEARANCEUR HAZY (A) 10/18/2018 1300   LABSPEC 1.009 10/18/2018 1300   PHURINE 6.0 10/18/2018 1300   GLUCOSEU NEGATIVE 10/18/2018 1300   HGBUR NEGATIVE 10/18/2018 1300   BILIRUBINUR NEGATIVE 10/18/2018 1300   KETONESUR NEGATIVE 10/18/2018 1300   PROTEINUR NEGATIVE 10/18/2018 1300   NITRITE NEGATIVE 10/18/2018 1300   LEUKOCYTESUR  NEGATIVE 10/18/2018 1300   Sepsis Labs Invalid input(s): PROCALCITONIN,  WBC,  LACTICIDVEN Microbiology Recent Results (from the past 240 hour(s))  SARS Coronavirus 2 (CEPHEID - Performed in [place] hospital lab), Hosp Order     Status: None   Collection Time: 10/18/18 11:26 AM  Result Value Ref Range Status   SARS Coronavirus 2 NEGATIVE NEGATIVE Final    Comment: (NOTE) If result is NEGATIVE SARS-CoV-2 target nucleic acids are NOT DETECTED. The SARS-CoV-2 RNA is generally detectable in upper and lower  respiratory specimens during the acute phase of infection. The lowest  concentration of SARS-CoV-2 viral copies this assay can detect is 250  copies / mL. A negative result does not preclude SARS-CoV-2 infection  and should not be used as the sole basis for treatment or other  patient management decisions.  A negative result may occur with  improper specimen collection / handling, submission of specimen other  than nasopharyngeal swab, presence of viral  mutation(s) within the  areas targeted by this assay, and inadequate number of viral copies  (<250 copies / mL). A negative result must be combined with clinical  observations, patient history, and epidemiological information. If result is POSITIVE SARS-CoV-2 target nucleic acids are DETECTED. The SARS-CoV-2 RNA is generally detectable in upper and lower  respiratory specimens dur ing the acute phase of infection.  Positive  results are indicative of active infection with SARS-CoV-2.  Clinical  correlation with patient history and other diagnostic information is  necessary to determine patient infection status.  Positive results do  not rule out bacterial infection or co-infection with other viruses. If result is PRESUMPTIVE POSTIVE SARS-CoV-2 nucleic acids MAY BE PRESENT.   A presumptive positive result was obtained on the submitted specimen  and confirmed on repeat testing.  While 2019 novel coronavirus  (SARS-CoV-2) nucleic  acids may be present in the submitted sample  additional confirmatory testing may be necessary for epidemiological  and / or clinical management purposes  to differentiate between  SARS-CoV-2 and other Sarbecovirus currently known to infect humans.  If clinically indicated additional testing with an alternate test  methodology [id number]) is advised. The SARS-CoV-2 RNA is generally  detectable in upper and lower respiratory sp ecimens during the acute  phase of infection. The expected result is Negative. Fact Sheet for Patients:  StrictlyIdeas.no Fact Sheet for Healthcare Providers: BankingDealers.co.za This test is not yet approved or cleared by the [place] FDA and has been authorized for detection and/or diagnosis of SARS-CoV-2 by FDA under an Emergency Use Authorization (EUA).  This EUA will remain in effect (meaning this test can be used) for the duration of the COVID-19 declaration under Section 564(b)(1) of the Act, 21 U.S.C. section 360bbb-3(b)(1), unless the authorization is terminated or revoked sooner. Performed at [facility], [place] [address] [place], [place] [address]      Time coordinating discharge: Over 30 minutes  SIGNED:   Little Ishikawa, DO [facility] 10/19/2018, 1:54 PM Pager   If 7PM-7AM, please contact night-coverage [email or web address] Password [email or web address]

## 2018-10-24 DIAGNOSIS — E782 Mixed hyperlipidemia: Secondary | ICD-10-CM | POA: Diagnosis not present

## 2018-10-24 DIAGNOSIS — I6621 Occlusion and stenosis of right posterior cerebral artery: Secondary | ICD-10-CM | POA: Diagnosis not present

## 2018-11-06 DIAGNOSIS — M47817 Spondylosis without myelopathy or radiculopathy, lumbosacral region: Secondary | ICD-10-CM | POA: Diagnosis not present

## 2018-11-06 DIAGNOSIS — G894 Chronic pain syndrome: Secondary | ICD-10-CM | POA: Diagnosis not present

## 2018-11-06 DIAGNOSIS — M961 Postlaminectomy syndrome, not elsewhere classified: Secondary | ICD-10-CM | POA: Diagnosis not present

## 2018-11-06 DIAGNOSIS — M47812 Spondylosis without myelopathy or radiculopathy, cervical region: Secondary | ICD-10-CM | POA: Diagnosis not present

## 2018-11-07 DIAGNOSIS — G4733 Obstructive sleep apnea (adult) (pediatric): Secondary | ICD-10-CM | POA: Diagnosis not present

## 2018-11-28 ENCOUNTER — Ambulatory Visit (INDEPENDENT_AMBULATORY_CARE_PROVIDER_SITE_OTHER): Payer: Medicare Other | Admitting: Adult Health

## 2018-11-28 ENCOUNTER — Other Ambulatory Visit: Payer: Self-pay

## 2018-11-28 ENCOUNTER — Encounter: Payer: Self-pay | Admitting: Adult Health

## 2018-11-28 VITALS — BP 159/75 | HR 68 | Temp 98.4°F | Ht 66.5 in | Wt 215.2 lb

## 2018-11-28 DIAGNOSIS — G459 Transient cerebral ischemic attack, unspecified: Secondary | ICD-10-CM

## 2018-11-28 DIAGNOSIS — I6621 Occlusion and stenosis of right posterior cerebral artery: Secondary | ICD-10-CM

## 2018-11-28 DIAGNOSIS — G4733 Obstructive sleep apnea (adult) (pediatric): Secondary | ICD-10-CM | POA: Diagnosis not present

## 2018-11-28 DIAGNOSIS — I10 Essential (primary) hypertension: Secondary | ICD-10-CM | POA: Diagnosis not present

## 2018-11-28 DIAGNOSIS — E785 Hyperlipidemia, unspecified: Secondary | ICD-10-CM

## 2018-11-28 DIAGNOSIS — Z9989 Dependence on other enabling machines and devices: Secondary | ICD-10-CM

## 2018-11-28 NOTE — Progress Notes (Signed)
[facility] [address] [place]. [place] [address] [phone]       HOSPITAL FOLLOW UP NOTE  Ronald [name] Date of Birth:  [birth year] Medical Record Number:  [id number]   Reason for Referral:  hospital stroke follow up    CHIEF COMPLAINT:  Chief Complaint  Patient presents with  . Hospitalization Follow-up    Rm 8,alone.  Has not seen opthamologist, diploplia resolved.  Only taking  81 aspirin  . Cerebrovascular Accident    HPI: Ronald [name] is being seen today for in office hospital follow-up regarding suspected brainstem TIA versus small infarct not visualized on CT on 10/18/2018. History obtained from patient and chart review. Reviewed all radiology images and labs personally.  Ronald [name] is a 78 y.o. male with history of sleep apnea, neuropathy, hyperlipidemia, lumbar radicular pain, hypertension   who presented to [facility] ED on 10/18/2018 with intermittent diplopia on the right side of his vision.  He did not receive IV t-PA due to late presentation (>4.5 hours from time of onset).  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  CT head unremarkable.  Unable to perform MRI due to spinal cord stimulator.  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  2D echo unremarkable.  LDL 159 and A1c 5.9.  Initiated atorvastatin 40 mg daily.  HTN stable.  Recommended DAPT for 3 weeks and aspirin 81 mg alone.  Other stroke risk factors include advanced age, obesity and OSA.  Discharged home in stable condition without therapy needs.  Stable from stroke standpoint without new or recurring stroke/TIA symptoms Returned back to all prior activities without difficulties  Completed 3 weeks DAPT and continues on aspirin alone without bleeding or bruising He attempted to take lipitor but due to side  effects, he was started on Crestor by PCP. He has been able to tolerate Crestor without myalgias.  Blood pressure 159/75 - typically lower at home Ongoing compliance with CPAP for OSA  No further concerns at this time    ROS:   14 system review of systems performed and negative with exception of no complaints  PMH:  Past Medical History:  Diagnosis Date  . Allergic rhinitis   . Anxiety   . BPH (benign prostatic hyperplasia)   . Chronic pain syndrome   . Hypertension   . Lumbar radicular pain   . Major depressive disorder   . Mixed hyperlipidemia   . Neuropathy   . Sleep apnea     PSH:  Past Surgical History:  Procedure Laterality Date  . BACK SURGERY    . CERVICAL FUSION    . colonoscopy     reported as normal  . LUMBAR NERVE STIMLATOR INSERTION    . THYROIDECTOMY      Social History:  Social History   Socioeconomic History  . Marital status: Married    Spouse name: Not on file  . Number of children: Not on file  . Years of education: Not on file  . Highest education level: Not on file  Occupational History  . Occupation: Theme park manager    Comment: [name] of a [employer] for over 40 years  Social Needs  . Financial resource strain: Not on file  . Food insecurity    Worry: Not on file    Inability: Not on file  . Transportation  needs    Medical: Not on file    Non-medical: Not on file  Tobacco Use  . Smoking status: Never Smoker  . Smokeless tobacco: Never Used  Substance and Sexual Activity  . Alcohol use: No  . Drug use: No  . Sexual activity: Yes    Birth control/protection: None  Lifestyle  . Physical activity    Days per week: Not on file    Minutes per session: Not on file  . Stress: Not on file  Relationships  . Social Herbalist on phone: Not on file    Gets together: Not on file    Attends religious service: Not on file    Active member of club or organization: Not on file    Attends meetings of clubs or organizations: Not on  file    Relationship status: Not on file  . Intimate partner violence    Fear of current or ex partner: Not on file    Emotionally abused: Not on file    Physically abused: Not on file    Forced sexual activity: Not on file  Other Topics Concern  . Not on file  Social History Narrative   [name] at [employer] - lives in [place]   Lives with Wife and     Family History:  Family History  Problem Relation Age of Onset  . Coronary artery disease Father 65       CABG  . Hypertension Father   . Diabetes Father     Medications:   Current Outpatient Medications on File Prior to Visit  Medication Sig Dispense Refill  . amLODipine (NORVASC) 5 MG tablet Take 5 mg by mouth daily.    Marland Kitchen aspirin EC 81 MG EC tablet Take 1 tablet (81 mg total) by mouth daily.    . busPIRone (BUSPAR) 5 MG tablet TAKE 1 TABLET BY MOUTH TWICE A DAY AS NEEDED FOR ANXIETY    . DULoxetine (CYMBALTA) 60 MG capsule Take 60 mg by mouth daily.    . furosemide (LASIX) 20 MG tablet Take 20 mg by mouth daily.     Marland Kitchen gabapentin (NEURONTIN) 300 MG capsule Take 300 mg by mouth 3 (three) times daily as needed (pain).     . Multiple Vitamins-Minerals (MULTIVITAMIN WITH MINERALS) tablet Take 1 tablet by mouth daily.    Marland Kitchen oxyCODONE (ROXICODONE) 15 MG immediate release tablet Take 15 mg by mouth every 4 (four) hours as needed for pain. Up to 7 tablets in a day.    Marland Kitchen rOPINIRole (REQUIP) 0.5 MG tablet Take 2 mg by mouth at bedtime.   2  . rosuvastatin (CRESTOR) 5 MG tablet 1 (ONE) TABLET EVERY MONDAY, WEDNESDAY, FRIDAY    . tamsulosin (FLOMAX) 0.4 MG CAPS Take 0.4 mg by mouth 2 (two) times a day.     . telmisartan (MICARDIS) 40 MG tablet Take 40 mg by mouth daily. for high blood pressure    . clopidogrel (PLAVIX) 75 MG tablet Take 1 tablet (75 mg total) by mouth daily. (Patient not taking: Reported on 11/28/2018) 30 tablet 0   No current facility-administered medications on file prior to visit.     Allergies:   Allergies  Allergen  Reactions  . Phenobarbital Rash     Physical Exam  Vitals:   11/28/18 1252  BP: (!) 159/75  Pulse: 68  Temp: 98.4 F (36.9 C)  Weight: 215 lb 3.2 oz (97.6 kg)  Height: 5' 6.5" (1.689 m)   Body  mass index is 34.21 kg/m.  Hearing Screening   125Hz  250Hz  500Hz  1000Hz  2000Hz  3000Hz  4000Hz  6000Hz  8000Hz   Right ear:           Left ear:             Visual Acuity Screening   Right eye Left eye Both eyes  Without correction:     With correction: 20/30 20/30     Depression screen [facility] 2/9 11/28/2018  Decreased Interest 0  Down, Depressed, Hopeless 0  PHQ - 2 Score 0     General: well developed, well nourished,  pleasant elderly Caucasian male, seated, in no evident distress Head: head normocephalic and atraumatic.   Neck: supple with no carotid or supraclavicular bruits Cardiovascular: regular rate and rhythm, no murmurs Musculoskeletal: no deformity Skin:  no rash/petichiae Vascular:  Normal pulses all extremities   Neurologic Exam Mental Status: Awake and fully alert. Oriented to place and time. Recent and remote memory intact. Attention span, concentration and fund of knowledge appropriate. Mood and affect appropriate.  Cranial Nerves: Fundoscopic exam reveals sharp disc margins. Pupils equal, briskly reactive to light. Extraocular movements full without nystagmus. Visual fields full to confrontation. Hearing intact. Facial sensation intact. Face, tongue, palate moves normally and symmetrically.  Motor: Normal bulk and tone. Normal strength in all tested extremity muscles. Sensory.: intact to touch , pinprick , position and vibratory sensation.  Coordination: Rapid alternating movements normal in all extremities. Finger-to-nose and heel-to-shin performed accurately bilaterally. Gait and Station: Arises from chair without difficulty. Stance is normal. Gait demonstrates normal stride length and balance Reflexes: 1+ and symmetric. Toes downgoing.     NIHSS  0 Modified  Rankin  0    Diagnostic Data (Labs, Imaging, Testing)  Ct Angio Head W Or Wo Contrast  Ct Angio Neck W And/or Wo Contrast 10/18/2018 IMPRESSION:  1. 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 Are there left side vision symptoms to suggest right PCA territory ischemia?  2. Arterial tortuosity and atherosclerosis in the head and neck with no other significant stenosis and no large vessel occlusion identified.  3. Stable noncontrast CT appearance of the brain since 2017. No acute cortically based infarct or intracranial hemorrhage. 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  4. 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   Dg Chest 2 View 10/18/2018 IMPRESSION:  1.  Stable cardiomegaly.  No pulmonary venous congestion.  2.  Mild bibasilar atelectasis.  3. Cervicothoracic spine scoliosis and degenerative change. Neurostimulator noted in stable position over thoracic spine.  4.  Carotid vascular disease.   MRI Brain Wo Contrast -not possible due to spinal cord stimulator     Transthoracic Echocardiogram  10/19/2018 Study Conclusions - Left ventricle: The cavity size was normal. Wall thickness was normal. Systolic function was normal. The estimated ejection fraction was in the range of 60% to 65%. Wall motion was normal; there were no regional wall motion abnormalities. Doppler parameters are consistent with abnormal left ventricular relaxation (grade 1 diastolic dysfunction). Impressions: - 1. Normal systolic function. Impaired relaxation. 2. Mild MR.   EKG - SR rate 77 BPM. (See cardiology reading for complete details)   ASSESSMENT: Ronald [name] is a 78 y.o. year old male presented to [facility] ED on 10/18/2018 with transient diplopia and right-sided vision and diagnosed with suspected  brainstem TIA versus small infarct not visualized on  CT.  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  Stable from stroke standpoint without new or recurring stroke/TIA symptoms.    PLAN:  1. TIA: Continue aspirin 81 mg daily  and atorvastatin for secondary stroke prevention. Maintain strict control of hypertension with blood pressure goal below 130/90, diabetes with hemoglobin A1c goal below 6.5% and cholesterol with LDL cholesterol (bad cholesterol) goal below 70 mg/dL.  I also advised the patient to eat a healthy diet with plenty of whole grains, cereals, fruits and vegetables, exercise regularly with at least 30 minutes of continuous activity daily and maintain ideal body weight. 2. HTN: Advised to continue current treatment regimen.  Today's BP 159/75 -monitors at home is typically lower.  Advised to continue to monitor at home along with continued follow-up with PCP for management 3. HLD: Advised to continue current treatment regimen along with continued follow-up with PCP for future prescribing and monitoring of lipid panel 4. OSA on CPAP: Ongoing compliance 5. Intracranial stenosis: Ongoing management of HTN, and HLD along with maintaining healthy diet and adequate exercise    Follow up in 5 months or call earlier if needed   Greater than 50% of time during this 45 minute visit was spent on counseling, explanation of diagnosis of TIA, reviewing risk factor management of HTN and HLD, planning of further management along with potential future management, and discussion with patient and family answering all questions.    Venancio Poisson, AGNP-BC  [facility] [address] [place], [place] [address]  Phone [phone] Fax [phone] Note: This document was prepared with digital dictation and possible smart phrase technology. Any transcriptional errors that result from this  process are unintentional.

## 2018-11-28 NOTE — Patient Instructions (Signed)
Continue aspirin 81 mg daily  and rosuvastatin   for secondary stroke prevention  We will check cholesterol levels today to ensure adequate management   Continue to follow up with PCP regarding cholesterol and blood pressure management   Continue to stay active and maintain a healthy diet  Continue to monitor blood pressure at home  Maintain strict control of hypertension with blood pressure goal below 130/90, diabetes with hemoglobin A1c goal below 6.5% and cholesterol with LDL cholesterol (bad cholesterol) goal below 70 mg/dL. I also advised the patient to eat a healthy diet with plenty of whole grains, cereals, fruits and vegetables, exercise regularly and maintain ideal body weight.  Followup in the future with me in 5 months or call earlier if needed       Thank you for coming to see [place] at [facility]. I hope we have been able to provide you high quality care today.  You may receive a patient satisfaction survey over the next few weeks. We would appreciate your feedback and comments so that we may continue to improve ourselves and the health of our patients.

## 2018-11-29 LAB — LIPID PANEL
Chol/HDL Ratio: 3.3 ratio (ref 0.0–5.0)
Cholesterol, Total: 146 mg/dL (ref 100–199)
HDL: 44 mg/dL (ref >39)
LDL Calculated: 67 mg/dL (ref 0–99)
Triglycerides: 173 mg/dL — ABNORMAL HIGH (ref 0–149)
VLDL Cholesterol Cal: 35 mg/dL (ref 5–40)

## 2018-11-29 NOTE — Progress Notes (Signed)
I agree with the above plan 

## 2018-12-02 ENCOUNTER — Telehealth: Payer: Self-pay | Admitting: *Deleted

## 2018-12-02 NOTE — Telephone Encounter (Signed)
-----   Message from Venancio Poisson, NP sent at 12/02/2018  8:08 AM EDT ----- Please advise patient that his recent cholesterol levels showed satisfactory LDL or bad cholesterol 67 with goal less than 70.  Please continue to follow current regimen.

## 2018-12-02 NOTE — Telephone Encounter (Signed)
Spoke to pt and relayed that the labs per JV/NP that his recent cholesterol levels showed satisfactory LDL or bad cholesterol 67 with goal less than 70. Please continue to follow current regimen.  He verbalized understanding.

## 2018-12-18 DIAGNOSIS — G894 Chronic pain syndrome: Secondary | ICD-10-CM | POA: Diagnosis not present

## 2018-12-18 DIAGNOSIS — Z79891 Long term (current) use of opiate analgesic: Secondary | ICD-10-CM | POA: Diagnosis not present

## 2018-12-18 DIAGNOSIS — M961 Postlaminectomy syndrome, not elsewhere classified: Secondary | ICD-10-CM | POA: Diagnosis not present

## 2018-12-18 DIAGNOSIS — M47812 Spondylosis without myelopathy or radiculopathy, cervical region: Secondary | ICD-10-CM | POA: Diagnosis not present

## 2018-12-18 DIAGNOSIS — M47817 Spondylosis without myelopathy or radiculopathy, lumbosacral region: Secondary | ICD-10-CM | POA: Diagnosis not present

## 2018-12-19 DIAGNOSIS — J209 Acute bronchitis, unspecified: Secondary | ICD-10-CM | POA: Diagnosis not present

## 2018-12-27 DIAGNOSIS — J189 Pneumonia, unspecified organism: Secondary | ICD-10-CM | POA: Diagnosis not present

## 2019-01-22 DIAGNOSIS — M961 Postlaminectomy syndrome, not elsewhere classified: Secondary | ICD-10-CM | POA: Diagnosis not present

## 2019-01-22 DIAGNOSIS — G894 Chronic pain syndrome: Secondary | ICD-10-CM | POA: Diagnosis not present

## 2019-01-22 DIAGNOSIS — M47817 Spondylosis without myelopathy or radiculopathy, lumbosacral region: Secondary | ICD-10-CM | POA: Diagnosis not present

## 2019-01-22 DIAGNOSIS — M47812 Spondylosis without myelopathy or radiculopathy, cervical region: Secondary | ICD-10-CM | POA: Diagnosis not present

## 2019-02-07 DIAGNOSIS — G4733 Obstructive sleep apnea (adult) (pediatric): Secondary | ICD-10-CM | POA: Diagnosis not present

## 2019-02-11 DIAGNOSIS — J209 Acute bronchitis, unspecified: Secondary | ICD-10-CM | POA: Diagnosis not present

## 2019-02-17 DIAGNOSIS — G894 Chronic pain syndrome: Secondary | ICD-10-CM | POA: Diagnosis not present

## 2019-02-17 DIAGNOSIS — M47817 Spondylosis without myelopathy or radiculopathy, lumbosacral region: Secondary | ICD-10-CM | POA: Diagnosis not present

## 2019-02-17 DIAGNOSIS — M47812 Spondylosis without myelopathy or radiculopathy, cervical region: Secondary | ICD-10-CM | POA: Diagnosis not present

## 2019-02-17 DIAGNOSIS — M961 Postlaminectomy syndrome, not elsewhere classified: Secondary | ICD-10-CM | POA: Diagnosis not present

## 2019-02-18 DIAGNOSIS — N182 Chronic kidney disease, stage 2 (mild): Secondary | ICD-10-CM | POA: Diagnosis not present

## 2019-02-18 DIAGNOSIS — R1310 Dysphagia, unspecified: Secondary | ICD-10-CM | POA: Diagnosis not present

## 2019-02-18 DIAGNOSIS — Z0000 Encounter for general adult medical examination without abnormal findings: Secondary | ICD-10-CM | POA: Diagnosis not present

## 2019-02-18 DIAGNOSIS — E782 Mixed hyperlipidemia: Secondary | ICD-10-CM | POA: Diagnosis not present

## 2019-02-18 DIAGNOSIS — J209 Acute bronchitis, unspecified: Secondary | ICD-10-CM | POA: Diagnosis not present

## 2019-02-18 DIAGNOSIS — Z79899 Other long term (current) drug therapy: Secondary | ICD-10-CM | POA: Diagnosis not present

## 2019-02-18 DIAGNOSIS — Z23 Encounter for immunization: Secondary | ICD-10-CM | POA: Diagnosis not present

## 2019-02-24 DIAGNOSIS — J209 Acute bronchitis, unspecified: Secondary | ICD-10-CM | POA: Diagnosis not present

## 2019-03-06 DIAGNOSIS — R0602 Shortness of breath: Secondary | ICD-10-CM | POA: Diagnosis not present

## 2019-03-06 DIAGNOSIS — J0100 Acute maxillary sinusitis, unspecified: Secondary | ICD-10-CM | POA: Diagnosis not present

## 2019-03-06 DIAGNOSIS — R05 Cough: Secondary | ICD-10-CM | POA: Diagnosis not present

## 2019-03-06 DIAGNOSIS — J209 Acute bronchitis, unspecified: Secondary | ICD-10-CM | POA: Diagnosis not present

## 2019-03-18 DIAGNOSIS — R1310 Dysphagia, unspecified: Secondary | ICD-10-CM | POA: Diagnosis not present

## 2019-03-20 DIAGNOSIS — G894 Chronic pain syndrome: Secondary | ICD-10-CM | POA: Diagnosis not present

## 2019-03-20 DIAGNOSIS — M961 Postlaminectomy syndrome, not elsewhere classified: Secondary | ICD-10-CM | POA: Diagnosis not present

## 2019-03-20 DIAGNOSIS — M47817 Spondylosis without myelopathy or radiculopathy, lumbosacral region: Secondary | ICD-10-CM | POA: Diagnosis not present

## 2019-03-20 DIAGNOSIS — M47812 Spondylosis without myelopathy or radiculopathy, cervical region: Secondary | ICD-10-CM | POA: Diagnosis not present

## 2019-03-21 DIAGNOSIS — R1314 Dysphagia, pharyngoesophageal phase: Secondary | ICD-10-CM | POA: Diagnosis not present

## 2019-03-21 DIAGNOSIS — K222 Esophageal obstruction: Secondary | ICD-10-CM | POA: Diagnosis not present

## 2019-03-21 DIAGNOSIS — K228 Other specified diseases of esophagus: Secondary | ICD-10-CM | POA: Diagnosis not present

## 2019-03-21 DIAGNOSIS — K208 Other esophagitis without bleeding: Secondary | ICD-10-CM | POA: Diagnosis not present

## 2019-03-21 DIAGNOSIS — K209 Esophagitis, unspecified without bleeding: Secondary | ICD-10-CM | POA: Diagnosis not present

## 2019-03-21 DIAGNOSIS — K2100 Gastro-esophageal reflux disease with esophagitis, without bleeding: Secondary | ICD-10-CM | POA: Diagnosis not present

## 2019-04-12 DIAGNOSIS — J3489 Other specified disorders of nose and nasal sinuses: Secondary | ICD-10-CM | POA: Diagnosis not present

## 2019-04-21 DIAGNOSIS — H2513 Age-related nuclear cataract, bilateral: Secondary | ICD-10-CM | POA: Diagnosis not present

## 2019-04-21 DIAGNOSIS — H5203 Hypermetropia, bilateral: Secondary | ICD-10-CM | POA: Diagnosis not present

## 2019-04-21 DIAGNOSIS — H25013 Cortical age-related cataract, bilateral: Secondary | ICD-10-CM | POA: Diagnosis not present

## 2019-04-21 DIAGNOSIS — H52203 Unspecified astigmatism, bilateral: Secondary | ICD-10-CM | POA: Diagnosis not present

## 2019-04-21 DIAGNOSIS — H11823 Conjunctivochalasis, bilateral: Secondary | ICD-10-CM | POA: Diagnosis not present

## 2019-04-23 DIAGNOSIS — G894 Chronic pain syndrome: Secondary | ICD-10-CM | POA: Diagnosis not present

## 2019-04-23 DIAGNOSIS — M47812 Spondylosis without myelopathy or radiculopathy, cervical region: Secondary | ICD-10-CM | POA: Diagnosis not present

## 2019-04-23 DIAGNOSIS — M47817 Spondylosis without myelopathy or radiculopathy, lumbosacral region: Secondary | ICD-10-CM | POA: Diagnosis not present

## 2019-04-23 DIAGNOSIS — M961 Postlaminectomy syndrome, not elsewhere classified: Secondary | ICD-10-CM | POA: Diagnosis not present

## 2019-05-01 ENCOUNTER — Ambulatory Visit: Payer: Medicare Other | Admitting: Adult Health

## 2019-05-01 ENCOUNTER — Telehealth: Payer: Self-pay

## 2019-05-01 NOTE — Progress Notes (Deleted)
[facility] [address] [place]. [place] [address] [phone]       OFFICE FOLLOW UP NOTE  [name] Date of Birth:  06/29/1940 Medical Record Number:  [id number]   Reason for Referral: stroke follow up    CHIEF COMPLAINT:  No chief complaint on file.   HPI: Stroke admission 10/18/2018: [name] is a 78 y.o. male with history of sleep apnea, neuropathy, hyperlipidemia, lumbar radicular pain, hypertension   who presented to [facility] ED on 10/18/2018 with intermittent diplopia on the right side of his vision.  He did not receive IV t-PA due to late presentation (>4.5 hours from time of onset).  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  CT head unremarkable.  Unable to perform MRI due to spinal cord stimulator.  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  2D echo unremarkable.  LDL 159 and A1c 5.9.  Initiated atorvastatin 40 mg daily.  HTN stable.  Recommended DAPT for 3 weeks and aspirin 81 mg alone.  Other stroke risk factors include advanced age, obesity and OSA.  Discharged home in stable condition without therapy needs.  Initial visit 11/28/2018: Stable from stroke standpoint without new or recurring stroke/TIA symptoms Returned back to all prior activities without difficulties  Completed 3 weeks DAPT and continues on aspirin alone without bleeding or bruising He attempted to take lipitor but due to side effects, he was started on Crestor by PCP. He has been able to tolerate Crestor without myalgias.  Blood pressure 159/75 - typically lower at home Ongoing compliance with CPAP for OSA  No further concerns at this time  Update 05/01/2019: Ronald [name] is a 78 year old male who is being seen today for stroke follow-up.  He has been stable from a stroke standpoint without  residual deficits or new/reoccurring stroke/TIA symptoms.  Continues on aspirin and Crestor for secondary stroke prevention without side effects.  Blood pressure today ***.  He endorses ongoing use of CPAP for OSA management.  No further concerns at this time.     ROS:   14 system review of systems performed and negative with exception of no complaints  PMH:  Past Medical History:  Diagnosis Date  . Allergic rhinitis   . Anxiety   . BPH (benign prostatic hyperplasia)   . Chronic pain syndrome   . Hypertension   . Lumbar radicular pain   . Major depressive disorder   . Mixed hyperlipidemia   . Neuropathy   . Sleep apnea     PSH:  Past Surgical History:  Procedure Laterality Date  . BACK SURGERY    . CERVICAL FUSION    . colonoscopy     reported as normal  . LUMBAR NERVE STIMLATOR INSERTION    . THYROIDECTOMY      Social History:  Social History   Socioeconomic History  . Marital status: Married    Spouse name: Not on file  . Number of children: Not on file  . Years of education: Not on file  . Highest education level: Not on file  Occupational History  . Occupation: Theme park manager    Comment: [name] of a [employer] for over 40 years  Tobacco Use  . Smoking status: Never Smoker  . Smokeless tobacco: Never Used  Substance and Sexual Activity  . Alcohol use: No  .  Drug use: No  . Sexual activity: Yes    Birth control/protection: None  Other Topics Concern  . Not on file  Social History Narrative   [name] at [employer] - lives in [place]   Lives with Wife and    Social Determinants of Health   Financial Resource Strain:   . Difficulty of Paying Living Expenses: Not on file  Food Insecurity:   . Worried About Charity fundraiser in the Last Year: Not on file  . Ran Out of Food in the Last Year: Not on file  Transportation Needs:   . Lack of Transportation (Medical): Not on file  . Lack of Transportation (Non-Medical): Not on file  Physical Activity:   .  Days of Exercise per Week: Not on file  . Minutes of Exercise per Session: Not on file  Stress:   . Feeling of Stress : Not on file  Social Connections:   . Frequency of Communication with Friends and Family: Not on file  . Frequency of Social Gatherings with Friends and Family: Not on file  . Attends Religious Services: Not on file  . Active Member of Clubs or Organizations: Not on file  . Attends Archivist Meetings: Not on file  . Marital Status: Not on file  Intimate Partner Violence:   . Fear of Current or Ex-Partner: Not on file  . Emotionally Abused: Not on file  . Physically Abused: Not on file  . Sexually Abused: Not on file    Family History:  Family History  Problem Relation Age of Onset  . Coronary artery disease Father 46       CABG  . Hypertension Father   . Diabetes Father     Medications:   Current Outpatient Medications on File Prior to Visit  Medication Sig Dispense Refill  . amLODipine (NORVASC) 5 MG tablet Take 5 mg by mouth daily.    Marland Kitchen aspirin EC 81 MG EC tablet Take 1 tablet (81 mg total) by mouth daily.    . busPIRone (BUSPAR) 5 MG tablet TAKE 1 TABLET BY MOUTH TWICE A DAY AS NEEDED FOR ANXIETY    . DULoxetine (CYMBALTA) 60 MG capsule Take 60 mg by mouth daily.    . furosemide (LASIX) 20 MG tablet Take 20 mg by mouth daily.     Marland Kitchen gabapentin (NEURONTIN) 300 MG capsule Take 300 mg by mouth 3 (three) times daily as needed (pain).     . Multiple Vitamins-Minerals (MULTIVITAMIN WITH MINERALS) tablet Take 1 tablet by mouth daily.    Marland Kitchen oxyCODONE (ROXICODONE) 15 MG immediate release tablet Take 15 mg by mouth every 4 (four) hours as needed for pain. Up to 7 tablets in a day.    Marland Kitchen rOPINIRole (REQUIP) 0.5 MG tablet Take 2 mg by mouth at bedtime.   2  . rosuvastatin (CRESTOR) 5 MG tablet 1 (ONE) TABLET EVERY MONDAY, WEDNESDAY, FRIDAY    . tamsulosin (FLOMAX) 0.4 MG CAPS Take 0.4 mg by mouth 2 (two) times a day.     . telmisartan (MICARDIS) 40 MG tablet  Take 40 mg by mouth daily. for high blood pressure     No current facility-administered medications on file prior to visit.    Allergies:   Allergies  Allergen Reactions  . Phenobarbital Rash     Physical Exam  There were no vitals filed for this visit. There is no height or weight on file to calculate BMI. No exam data present  Depression screen  PHQ 2/9 11/28/2018  Decreased Interest 0  Down, Depressed, Hopeless 0  PHQ - 2 Score 0     General: well developed, well nourished,  pleasant elderly Caucasian male, seated, in no evident distress Head: head normocephalic and atraumatic.   Neck: supple with no carotid or supraclavicular bruits Cardiovascular: regular rate and rhythm, no murmurs Musculoskeletal: no deformity Skin:  no rash/petichiae Vascular:  Normal pulses all extremities   Neurologic Exam Mental Status: Awake and fully alert. Oriented to place and time. Recent and remote memory intact. Attention span, concentration and fund of knowledge appropriate. Mood and affect appropriate.  Cranial Nerves: Fundoscopic exam reveals sharp disc margins. Pupils equal, briskly reactive to light. Extraocular movements full without nystagmus. Visual fields full to confrontation. Hearing intact. Facial sensation intact. Face, tongue, palate moves normally and symmetrically.  Motor: Normal bulk and tone. Normal strength in all tested extremity muscles. Sensory.: intact to touch , pinprick , position and vibratory sensation.  Coordination: Rapid alternating movements normal in all extremities. Finger-to-nose and heel-to-shin performed accurately bilaterally. Gait and Station: Arises from chair without difficulty. Stance is normal. Gait demonstrates normal stride length and balance Reflexes: 1+ and symmetric. Toes downgoing.     NIHSS  0 Modified Rankin  0    Diagnostic Data (Labs, Imaging, Testing)  Ct Angio Head W Or Wo Contrast  Ct Angio Neck W And/or Wo  Contrast 10/18/2018 IMPRESSION:  1. 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 Are there left side vision symptoms to suggest right PCA territory ischemia?  2. Arterial tortuosity and atherosclerosis in the head and neck with no other significant stenosis and no large vessel occlusion identified.  3. Stable noncontrast CT appearance of the brain since 2017. No acute cortically based infarct or intracranial hemorrhage. 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  4. 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   Dg Chest 2 View 10/18/2018 IMPRESSION:  1.  Stable cardiomegaly.  No pulmonary venous congestion.  2.  Mild bibasilar atelectasis.  3. Cervicothoracic spine scoliosis and degenerative change. Neurostimulator noted in stable position over thoracic spine.  4.  Carotid vascular disease.   MRI Brain Wo Contrast -not possible due to spinal cord stimulator     Transthoracic Echocardiogram  10/19/2018 Study Conclusions - Left ventricle: The cavity size was normal. Wall thickness was normal. Systolic function was normal. The estimated ejection fraction was in the range of 60% to 65%. Wall motion was normal; there were no regional wall motion abnormalities. Doppler parameters are consistent with abnormal left ventricular relaxation (grade 1 diastolic dysfunction). Impressions: - 1. Normal systolic function. Impaired relaxation. 2. Mild MR.   EKG - SR rate 77 BPM. (See cardiology reading for complete details)   ASSESSMENT: Ronald [name] is a 78 y.o. year old male presented to [facility] ED on 10/18/2018 with transient diplopia and right-sided vision and diagnosed with suspected brainstem TIA versus small infarct not visualized on CT.  Vascular risk factors include HTN, HLD, sleep apnea, obesity  and a severe right PCA stenosis of the P1 P2 junction.  Stable from stroke standpoint without new or recurring stroke/TIA symptoms.    PLAN:  1. TIA: Continue aspirin 81 mg daily  and atorvastatin for secondary stroke prevention. Maintain strict control of hypertension with blood pressure goal below 130/90, diabetes with hemoglobin A1c goal below 6.5% and cholesterol with LDL  cholesterol (bad cholesterol) goal below 70 mg/dL.  I also advised the patient to eat a healthy diet with plenty of whole grains, cereals, fruits and vegetables, exercise regularly with at least 30 minutes of continuous activity daily and maintain ideal body weight. 2. HTN: Advised to continue current treatment regimen.  Today's BP 159/75 -monitors at home is typically lower.  Advised to continue to monitor at home along with continued follow-up with PCP for management 3. HLD: Advised to continue current treatment regimen along with continued follow-up with PCP for future prescribing and monitoring of lipid panel 4. OSA on CPAP: Ongoing compliance 5. Intracranial stenosis: Ongoing management of HTN, and HLD along with maintaining healthy diet and adequate exercise    Follow up in 5 months or call earlier if needed   Greater than 50% of time during this 25 minute visit was spent on counseling, explanation of diagnosis of TIA, reviewing risk factor management of HTN and HLD, planning of further management along with potential future management, and discussion with patient and family answering all questions.    Frann Rider, AGNP-BC  [facility] [address] [place], [place] [address]  Phone [phone] Fax [phone] Note: This document was prepared with digital dictation and possible smart phrase technology. Any transcriptional errors that result from this process are unintentional.

## 2019-05-01 NOTE — Telephone Encounter (Signed)
Patient was a no call/no show for their appointment today.   

## 2019-05-05 ENCOUNTER — Encounter: Payer: Self-pay | Admitting: Adult Health

## 2019-05-12 DIAGNOSIS — G4733 Obstructive sleep apnea (adult) (pediatric): Secondary | ICD-10-CM | POA: Diagnosis not present

## 2019-05-22 DIAGNOSIS — M961 Postlaminectomy syndrome, not elsewhere classified: Secondary | ICD-10-CM | POA: Diagnosis not present

## 2019-05-22 DIAGNOSIS — M47817 Spondylosis without myelopathy or radiculopathy, lumbosacral region: Secondary | ICD-10-CM | POA: Diagnosis not present

## 2019-05-22 DIAGNOSIS — G894 Chronic pain syndrome: Secondary | ICD-10-CM | POA: Diagnosis not present

## 2019-05-22 DIAGNOSIS — M47812 Spondylosis without myelopathy or radiculopathy, cervical region: Secondary | ICD-10-CM | POA: Diagnosis not present

## 2019-06-07 ENCOUNTER — Ambulatory Visit: Payer: Medicare Other | Attending: Internal Medicine

## 2019-06-07 DIAGNOSIS — Z23 Encounter for immunization: Secondary | ICD-10-CM | POA: Insufficient documentation

## 2019-06-07 NOTE — Progress Notes (Signed)
   Covid-19 Vaccination Clinic  Name:  ELDRIC JOACHIM    MRN: [id number] DOB: 11/08/1940  06/07/2019  Mr. Nuttall was observed post Covid-19 immunization for 15 minutes without incidence. He was provided with Vaccine Information Sheet and instruction to access the V-Safe system.   Mr. Nugen was instructed to call 911 with any severe reactions post vaccine: Marland Kitchen Difficulty breathing  . Swelling of your face and throat  . A fast heartbeat  . A bad rash all over your body  . Dizziness and weakness    Immunizations Administered    Name Date Dose VIS Date Route   Pfizer COVID-19 Vaccine 06/07/2019 10:46 AM 0.3 mL 04/25/2019 Intramuscular   Manufacturer: Var[place]   Lot: [id number]   Show Low: [id number]

## 2019-06-18 ENCOUNTER — Ambulatory Visit: Payer: Medicare Other | Admitting: Adult Health

## 2019-06-18 ENCOUNTER — Other Ambulatory Visit: Payer: Self-pay

## 2019-06-18 ENCOUNTER — Encounter: Payer: Self-pay | Admitting: Adult Health

## 2019-06-18 VITALS — BP 128/72 | HR 71 | Temp 97.5°F | Ht 66.0 in | Wt 227.6 lb

## 2019-06-18 DIAGNOSIS — Z9989 Dependence on other enabling machines and devices: Secondary | ICD-10-CM

## 2019-06-18 DIAGNOSIS — I10 Essential (primary) hypertension: Secondary | ICD-10-CM | POA: Diagnosis not present

## 2019-06-18 DIAGNOSIS — E785 Hyperlipidemia, unspecified: Secondary | ICD-10-CM | POA: Diagnosis not present

## 2019-06-18 DIAGNOSIS — G4733 Obstructive sleep apnea (adult) (pediatric): Secondary | ICD-10-CM | POA: Diagnosis not present

## 2019-06-18 DIAGNOSIS — G459 Transient cerebral ischemic attack, unspecified: Secondary | ICD-10-CM

## 2019-06-18 NOTE — Progress Notes (Signed)
[facility] [address] [place]. [place] [address] [phone]       STROKE FOLLOW UP NOTE  Ronald [name] Date of Birth:  February 22, 1941 Medical Record Number:  [id number]   Reason for Referral: stroke follow up    CHIEF COMPLAINT:  Chief Complaint  Patient presents with  . Follow-up    RM9. Alone. No questions, no concerns.     HPI:   Stroke Admission 10/18/2018: Mr. [name] is a 79 y.o. male with history of sleep apnea, neuropathy, hyperlipidemia, lumbar radicular pain, hypertension   who presented to [facility] ED on 10/18/2018 with intermittent diplopia on the right side of his vision.  He did not receive IV t-PA due to late presentation (>4.5 hours from time of onset).  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  CT head unremarkable.  Unable to perform MRI due to spinal cord stimulator.  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  2D echo unremarkable.  LDL 159 and A1c 5.9.  Initiated atorvastatin 40 mg daily.  HTN stable.  Recommended DAPT for 3 weeks and aspirin 81 mg alone.  Other stroke risk factors include advanced age, obesity and OSA.  Discharged home in stable condition without therapy needs.  Update 11/28/2018: Stable from stroke standpoint without new or recurring stroke/TIA symptoms Returned back to all prior activities without difficulties  Completed 3 weeks DAPT and continues on aspirin alone without bleeding or bruising He attempted to take lipitor but due to side effects, he was started on Crestor by PCP. He has been able to tolerate Crestor without myalgias.  Blood pressure 159/75 - typically lower at home Ongoing compliance with CPAP for OSA  No further concerns at this time  Update 06/17/2019: Mr Sammarco is doing well. He has not had any new or recurring stroke/TIA  symptoms. He has continued to take Asprin without any bleeding or bruising. Has continued to take Crestor without any side effects of myalgias. Blood pressure today 128/72. Continued compliance with CPAP for OSA. No other questions or concerns at this time.    ROS:   14 system review of systems performed and negative with exception of no complaints  PMH:  Past Medical History:  Diagnosis Date  . Allergic rhinitis   . Anxiety   . BPH (benign prostatic hyperplasia)   . Chronic pain syndrome   . Hypertension   . Lumbar radicular pain   . Major depressive disorder   . Mixed hyperlipidemia   . Neuropathy   . Sleep apnea     PSH:  Past Surgical History:  Procedure Laterality Date  . BACK SURGERY    . CERVICAL FUSION    . colonoscopy     reported as normal  . LUMBAR NERVE STIMLATOR INSERTION    . THYROIDECTOMY      Social History:  Social History   Socioeconomic History  . Marital status: Married    Spouse name: Not on file  . Number of children: Not on file  . Years of education: Not on file  . Highest education level: Not on file  Occupational History  . Occupation: Theme park manager    Comment: [name] of a [employer] for over 40 years  Tobacco Use  . Smoking status: Never Smoker  . Smokeless tobacco: Never Used  Substance and Sexual Activity  .  Alcohol use: No  . Drug use: No  . Sexual activity: Yes    Birth control/protection: None  Other Topics Concern  . Not on file  Social History Narrative   [name] at [employer] - lives in [place]   Lives with Wife and    Social Determinants of Health   Financial Resource Strain:   . Difficulty of Paying Living Expenses: Not on file  Food Insecurity:   . Worried About Charity fundraiser in the Last Year: Not on file  . Ran Out of Food in the Last Year: Not on file  Transportation Needs:   . Lack of Transportation (Medical): Not on file  . Lack of Transportation (Non-Medical): Not on file  Physical Activity:   . Days of  Exercise per Week: Not on file  . Minutes of Exercise per Session: Not on file  Stress:   . Feeling of Stress : Not on file  Social Connections:   . Frequency of Communication with Friends and Family: Not on file  . Frequency of Social Gatherings with Friends and Family: Not on file  . Attends Religious Services: Not on file  . Active Member of Clubs or Organizations: Not on file  . Attends Archivist Meetings: Not on file  . Marital Status: Not on file  Intimate Partner Violence:   . Fear of Current or Ex-Partner: Not on file  . Emotionally Abused: Not on file  . Physically Abused: Not on file  . Sexually Abused: Not on file    Family History:  Family History  Problem Relation Age of Onset  . Coronary artery disease Father 76       CABG  . Hypertension Father   . Diabetes Father     Medications:   Current Outpatient Medications on File Prior to Visit  Medication Sig Dispense Refill  . amLODipine (NORVASC) 5 MG tablet Take 5 mg by mouth daily.    Marland Kitchen aspirin EC 81 MG EC tablet Take 1 tablet (81 mg total) by mouth daily.    . DULoxetine (CYMBALTA) 60 MG capsule Take 60 mg by mouth daily.    . furosemide (LASIX) 20 MG tablet Take 20 mg by mouth daily.     Marland Kitchen gabapentin (NEURONTIN) 300 MG capsule Take 300 mg by mouth 3 (three) times daily as needed (pain).     Marland Kitchen oxyCODONE (ROXICODONE) 15 MG immediate release tablet Take 15 mg by mouth every 4 (four) hours as needed for pain. Up to 7 tablets in a day.    . pantoprazole (PROTONIX) 40 MG tablet Take 40 mg by mouth daily.    Marland Kitchen rOPINIRole (REQUIP) 0.5 MG tablet Take 2 mg by mouth at bedtime.   2  . rosuvastatin (CRESTOR) 5 MG tablet 1 (ONE) TABLET EVERY MONDAY, WEDNESDAY, FRIDAY    . tamsulosin (FLOMAX) 0.4 MG CAPS Take 0.4 mg by mouth 2 (two) times a day.     . telmisartan (MICARDIS) 40 MG tablet Take 40 mg by mouth daily. for high blood pressure     No current facility-administered medications on file prior to visit.     Allergies:   Allergies  Allergen Reactions  . Phenobarbital Rash     Physical Exam  Vitals:   06/18/19 0835  BP: 128/72  Pulse: 71  Temp: (!) 97.5 F (36.4 C)  Weight: 227 lb 9.6 oz (103.2 kg)  Height: 5\' 6"  (1.676 m)   Body mass index is 36.74 kg/m. No exam data  present  General: well developed, well nourished,  pleasant elderly Caucasian male, seated, in no evident distress Head: head normocephalic and atraumatic.   Neck: supple with no carotid or supraclavicular bruits Cardiovascular: regular rate and rhythm, no murmurs Musculoskeletal: no deformity Skin:  no rash/petichiae Vascular:  Normal pulses all extremities   Neurologic Exam Mental Status: Awake and fully alert. Oriented to place and time. Recent and remote memory intact. Attention span, concentration and fund of knowledge appropriate. Mood and affect appropriate.  Cranial Nerves: Pupils equal, briskly reactive to light. Extraocular movements full without nystagmus. Visual fields full to confrontation. Hearing intact. Facial sensation intact. Face, tongue, palate moves normally and symmetrically.  Motor: Normal bulk and tone. Normal strength in all tested extremity muscles. Sensory.: intact to touch , pinprick , position and vibratory sensation.  Coordination: Rapid alternating movements normal in all extremities. Finger-to-nose and heel-to-shin performed accurately bilaterally. Gait and Station: Arises from chair without difficulty. Stance is normal. Gait demonstrates normal stride length and balance Reflexes: 1+ and symmetric. Toes downgoing.      Diagnostic Data (Labs, Imaging, Testing)  Ct Angio Head W Or Wo Contrast  Ct Angio Neck W And/or Wo Contrast 10/18/2018 IMPRESSION:  1. 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 Are there left side vision symptoms to suggest right PCA territory ischemia?  2. Arterial tortuosity and  atherosclerosis in the head and neck with no other significant stenosis and no large vessel occlusion identified.  3. Stable noncontrast CT appearance of the brain since 2017. No acute cortically based infarct or intracranial hemorrhage. 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  4. 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   Dg Chest 2 View 10/18/2018 IMPRESSION:  1.  Stable cardiomegaly.  No pulmonary venous congestion.  2.  Mild bibasilar atelectasis.  3. Cervicothoracic spine scoliosis and degenerative change. Neurostimulator noted in stable position over thoracic spine.  4.  Carotid vascular disease.   MRI Brain Wo Contrast -not possible due to spinal cord stimulator     Transthoracic Echocardiogram  10/19/2018 Study Conclusions - Left ventricle: The cavity size was normal. Wall thickness was normal. Systolic function was normal. The estimated ejection fraction was in the range of 60% to 65%. Wall motion was normal; there were no regional wall motion abnormalities. Doppler parameters are consistent with abnormal left ventricular relaxation (grade 1 diastolic dysfunction). Impressions: - 1. Normal systolic function. Impaired relaxation. 2. Mild MR.   EKG - SR rate 77 BPM. (See cardiology reading for complete details)   ASSESSMENT: MENDEL ROMEO is a 79 y.o. year old male presented to [facility] ED on 10/18/2018 with transient diplopia and right-sided vision and diagnosed with suspected brainstem TIA versus small infarct not visualized on CT.  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 He is being seen today and is doing well. Stable from stroke standpoint without new or recurring stroke/TIA symptoms.     PLAN:  1. TIA: Continue aspirin 81 mg daily  and crestor for secondary  stroke prevention. Maintain strict control of hypertension with blood pressure goal below 130/90, diabetes with hemoglobin A1c goal below 6.5% and cholesterol with LDL cholesterol (bad cholesterol) goal below 70 mg/dL.  I also advised the patient to eat a healthy diet with plenty of whole grains, cereals, fruits and  vegetables, exercise regularly with at least 30 minutes of continuous activity daily and maintain ideal body weight. 2. HTN: Advised to continue current treatment regimen.  Today's BP 128/72.  Advised to continue to monitor at home along with continued follow-up with PCP for management 3. HLD: Advised to continue current treatment regimen along with continued follow-up with PCP for future prescribing and monitoring of lipid panel 4. OSA on CPAP: Ongoing compliance    Follow up as needed as patient stable from stroke standpoint - advised to call office in the future with questions or concerns regarding prior stroke    Greater than 50% of time during this 20 minute visit was spent on counseling, explanation of diagnosis of TIA, reviewing risk factor management of HTN, OSA on CPAP and HLD, planning of further management along with potential future management, and discussion with patient answering all questions to patients satisfaction      Frann Rider, AGNP-BC  [facility] [address] [place], [place] [address]  Phone [phone] Fax [phone] Note: This document was prepared with digital dictation and possible smart phrase technology. Any transcriptional errors that result from this process are unintentional.

## 2019-06-18 NOTE — Progress Notes (Signed)
I agree with the above plan 

## 2019-06-18 NOTE — Patient Instructions (Addendum)
To continue Asprin and Crestor for stroke prevention.  Continue to follow up with PCP for cholesterol and BP management.   As you have been stable from a stroke standpoint you may follow up as needed in regards to your stroke.

## 2019-06-23 DIAGNOSIS — M961 Postlaminectomy syndrome, not elsewhere classified: Secondary | ICD-10-CM | POA: Diagnosis not present

## 2019-06-23 DIAGNOSIS — M47817 Spondylosis without myelopathy or radiculopathy, lumbosacral region: Secondary | ICD-10-CM | POA: Diagnosis not present

## 2019-06-23 DIAGNOSIS — M47812 Spondylosis without myelopathy or radiculopathy, cervical region: Secondary | ICD-10-CM | POA: Diagnosis not present

## 2019-06-23 DIAGNOSIS — G894 Chronic pain syndrome: Secondary | ICD-10-CM | POA: Diagnosis not present

## 2019-06-28 ENCOUNTER — Ambulatory Visit: Payer: Medicare Other | Attending: Internal Medicine

## 2019-06-28 DIAGNOSIS — Z23 Encounter for immunization: Secondary | ICD-10-CM | POA: Insufficient documentation

## 2019-06-28 NOTE — Progress Notes (Signed)
   Covid-19 Vaccination Clinic  Name:  Ronald [name]    MRN: [id number] DOB: [birth year]  06/28/2019  Ronald [name] was observed post Covid-19 immunization for 15 minutes without incidence. He was provided with Vaccine Information Sheet and instruction to access the V-Safe system.   Ronald [name] was instructed to call 911 with any severe reactions post vaccine: Marland Kitchen Difficulty breathing  . Swelling of your face and throat  . A fast heartbeat  . A bad rash all over your body  . Dizziness and weakness    Immunizations Administered    Name Date Dose VIS Date Route   Pfizer COVID-19 Vaccine 06/28/2019  1:21 PM 0.3 mL 04/25/2019 Intramuscular   Manufacturer: [place]   Lot: [id number]   [place]: [id number]

## 2019-07-28 DIAGNOSIS — M47817 Spondylosis without myelopathy or radiculopathy, lumbosacral region: Secondary | ICD-10-CM | POA: Diagnosis not present

## 2019-07-28 DIAGNOSIS — Z79891 Long term (current) use of opiate analgesic: Secondary | ICD-10-CM | POA: Diagnosis not present

## 2019-07-28 DIAGNOSIS — G894 Chronic pain syndrome: Secondary | ICD-10-CM | POA: Diagnosis not present

## 2019-07-28 DIAGNOSIS — M961 Postlaminectomy syndrome, not elsewhere classified: Secondary | ICD-10-CM | POA: Diagnosis not present

## 2019-07-28 DIAGNOSIS — M47812 Spondylosis without myelopathy or radiculopathy, cervical region: Secondary | ICD-10-CM | POA: Diagnosis not present

## 2019-08-08 DIAGNOSIS — G4733 Obstructive sleep apnea (adult) (pediatric): Secondary | ICD-10-CM | POA: Diagnosis not present

## 2019-08-21 DIAGNOSIS — Z9181 History of falling: Secondary | ICD-10-CM | POA: Diagnosis not present

## 2019-08-21 DIAGNOSIS — M79672 Pain in left foot: Secondary | ICD-10-CM | POA: Diagnosis not present

## 2019-08-21 DIAGNOSIS — R609 Edema, unspecified: Secondary | ICD-10-CM | POA: Diagnosis not present

## 2019-08-21 DIAGNOSIS — M79671 Pain in right foot: Secondary | ICD-10-CM | POA: Diagnosis not present

## 2019-08-25 DIAGNOSIS — M961 Postlaminectomy syndrome, not elsewhere classified: Secondary | ICD-10-CM | POA: Diagnosis not present

## 2019-08-25 DIAGNOSIS — M47817 Spondylosis without myelopathy or radiculopathy, lumbosacral region: Secondary | ICD-10-CM | POA: Diagnosis not present

## 2019-08-25 DIAGNOSIS — G894 Chronic pain syndrome: Secondary | ICD-10-CM | POA: Diagnosis not present

## 2019-08-25 DIAGNOSIS — M47812 Spondylosis without myelopathy or radiculopathy, cervical region: Secondary | ICD-10-CM | POA: Diagnosis not present

## 2019-09-23 DIAGNOSIS — G894 Chronic pain syndrome: Secondary | ICD-10-CM | POA: Diagnosis not present

## 2019-09-23 DIAGNOSIS — M47812 Spondylosis without myelopathy or radiculopathy, cervical region: Secondary | ICD-10-CM | POA: Diagnosis not present

## 2019-09-23 DIAGNOSIS — M961 Postlaminectomy syndrome, not elsewhere classified: Secondary | ICD-10-CM | POA: Diagnosis not present

## 2019-09-23 DIAGNOSIS — M47817 Spondylosis without myelopathy or radiculopathy, lumbosacral region: Secondary | ICD-10-CM | POA: Diagnosis not present

## 2019-10-21 DIAGNOSIS — M961 Postlaminectomy syndrome, not elsewhere classified: Secondary | ICD-10-CM | POA: Diagnosis not present

## 2019-10-21 DIAGNOSIS — G894 Chronic pain syndrome: Secondary | ICD-10-CM | POA: Diagnosis not present

## 2019-10-21 DIAGNOSIS — M47812 Spondylosis without myelopathy or radiculopathy, cervical region: Secondary | ICD-10-CM | POA: Diagnosis not present

## 2019-10-21 DIAGNOSIS — M47817 Spondylosis without myelopathy or radiculopathy, lumbosacral region: Secondary | ICD-10-CM | POA: Diagnosis not present

## 2019-11-18 DIAGNOSIS — G894 Chronic pain syndrome: Secondary | ICD-10-CM | POA: Diagnosis not present

## 2019-11-18 DIAGNOSIS — M961 Postlaminectomy syndrome, not elsewhere classified: Secondary | ICD-10-CM | POA: Diagnosis not present

## 2019-11-18 DIAGNOSIS — M47817 Spondylosis without myelopathy or radiculopathy, lumbosacral region: Secondary | ICD-10-CM | POA: Diagnosis not present

## 2019-11-18 DIAGNOSIS — M47812 Spondylosis without myelopathy or radiculopathy, cervical region: Secondary | ICD-10-CM | POA: Diagnosis not present

## 2019-11-22 DIAGNOSIS — R0600 Dyspnea, unspecified: Secondary | ICD-10-CM | POA: Diagnosis not present

## 2019-11-24 DIAGNOSIS — R1084 Generalized abdominal pain: Secondary | ICD-10-CM | POA: Diagnosis not present

## 2019-12-17 DIAGNOSIS — G894 Chronic pain syndrome: Secondary | ICD-10-CM | POA: Diagnosis not present

## 2019-12-17 DIAGNOSIS — M961 Postlaminectomy syndrome, not elsewhere classified: Secondary | ICD-10-CM | POA: Diagnosis not present

## 2019-12-17 DIAGNOSIS — M47812 Spondylosis without myelopathy or radiculopathy, cervical region: Secondary | ICD-10-CM | POA: Diagnosis not present

## 2019-12-17 DIAGNOSIS — M47817 Spondylosis without myelopathy or radiculopathy, lumbosacral region: Secondary | ICD-10-CM | POA: Diagnosis not present

## 2020-01-18 DIAGNOSIS — R5383 Other fatigue: Secondary | ICD-10-CM | POA: Diagnosis not present

## 2020-01-18 DIAGNOSIS — Z20828 Contact with and (suspected) exposure to other viral communicable diseases: Secondary | ICD-10-CM | POA: Diagnosis not present

## 2020-01-21 DIAGNOSIS — M47817 Spondylosis without myelopathy or radiculopathy, lumbosacral region: Secondary | ICD-10-CM | POA: Diagnosis not present

## 2020-01-21 DIAGNOSIS — M961 Postlaminectomy syndrome, not elsewhere classified: Secondary | ICD-10-CM | POA: Diagnosis not present

## 2020-01-21 DIAGNOSIS — G894 Chronic pain syndrome: Secondary | ICD-10-CM | POA: Diagnosis not present

## 2020-01-21 DIAGNOSIS — M47812 Spondylosis without myelopathy or radiculopathy, cervical region: Secondary | ICD-10-CM | POA: Diagnosis not present

## 2020-01-28 DIAGNOSIS — Z6833 Body mass index (BMI) 33.0-33.9, adult: Secondary | ICD-10-CM | POA: Diagnosis not present

## 2020-01-28 DIAGNOSIS — N289 Disorder of kidney and ureter, unspecified: Secondary | ICD-10-CM | POA: Diagnosis not present

## 2020-01-28 DIAGNOSIS — R69 Illness, unspecified: Secondary | ICD-10-CM | POA: Diagnosis not present

## 2020-02-06 ENCOUNTER — Other Ambulatory Visit: Payer: Self-pay

## 2020-02-06 ENCOUNTER — Emergency Department (HOSPITAL_COMMUNITY)
Admission: EM | Admit: 2020-02-06 | Discharge: 2020-02-06 | Disposition: A | Discharge status: Home / Self Care | Payer: Medicare HMO | Attending: Emergency Medicine | Admitting: Emergency Medicine

## 2020-02-06 ENCOUNTER — Encounter (HOSPITAL_COMMUNITY): Payer: Self-pay | Admitting: Emergency Medicine

## 2020-02-06 DIAGNOSIS — R9431 Abnormal electrocardiogram [ECG] [EKG]: Secondary | ICD-10-CM | POA: Diagnosis not present

## 2020-02-06 DIAGNOSIS — Z743 Need for continuous supervision: Secondary | ICD-10-CM | POA: Diagnosis not present

## 2020-02-06 DIAGNOSIS — R52 Pain, unspecified: Secondary | ICD-10-CM | POA: Diagnosis not present

## 2020-02-06 DIAGNOSIS — F410 Panic disorder [episodic paroxysmal anxiety] without agoraphobia: Secondary | ICD-10-CM | POA: Insufficient documentation

## 2020-02-06 DIAGNOSIS — R69 Illness, unspecified: Secondary | ICD-10-CM | POA: Diagnosis not present

## 2020-02-06 DIAGNOSIS — Z5321 Procedure and treatment not carried out due to patient leaving prior to being seen by health care provider: Secondary | ICD-10-CM | POA: Diagnosis not present

## 2020-02-06 LAB — CBC WITH DIFFERENTIAL/PLATELET
Abs Immature Granulocytes: 0.02 10*3/uL (ref 0.00–0.07)
Basophils Absolute: 0 10*3/uL (ref 0.0–0.1)
Basophils Relative: 0 %
Eosinophils Absolute: 0.2 10*3/uL (ref 0.0–0.5)
Eosinophils Relative: 3 %
HCT: 38.9 % — ABNORMAL LOW (ref 39.0–52.0)
Hemoglobin: 12.7 g/dL — ABNORMAL LOW (ref 13.0–17.0)
Immature Granulocytes: 0 %
Lymphocytes Relative: 24 %
Lymphs Abs: 1.3 10*3/uL (ref 0.7–4.0)
MCH: 31.2 pg (ref 26.0–34.0)
MCHC: 32.6 g/dL (ref 30.0–36.0)
MCV: 95.6 fL (ref 80.0–100.0)
Monocytes Absolute: 0.5 10*3/uL (ref 0.1–1.0)
Monocytes Relative: 10 %
Neutro Abs: 3.4 10*3/uL (ref 1.7–7.7)
Neutrophils Relative %: 63 %
Platelets: 185 10*3/uL (ref 150–400)
RBC: 4.07 MIL/uL — ABNORMAL LOW (ref 4.22–5.81)
RDW: 13.7 % (ref 11.5–15.5)
WBC: 5.5 10*3/uL (ref 4.0–10.5)
nRBC: 0 % (ref 0.0–0.2)

## 2020-02-06 LAB — ACETAMINOPHEN LEVEL: Acetaminophen (Tylenol), Serum: <10 ug/mL — ABNORMAL LOW (ref 10–30)

## 2020-02-06 LAB — COMPREHENSIVE METABOLIC PANEL
ALT: 18 U/L (ref 0–44)
AST: 20 U/L (ref 15–41)
Albumin: 4.5 g/dL (ref 3.5–5.0)
Alkaline Phosphatase: 95 U/L (ref 38–126)
Anion gap: 13 (ref 5–15)
BUN: 20 mg/dL (ref 8–23)
CO2: 23 mmol/L (ref 22–32)
Calcium: 9.3 mg/dL (ref 8.9–10.3)
Chloride: 104 mmol/L (ref 98–111)
Creatinine, Ser: 1.6 mg/dL — ABNORMAL HIGH (ref 0.61–1.24)
GFR calc Af Amer: 46 mL/min — ABNORMAL LOW (ref >60)
GFR calc non Af Amer: 40 mL/min — ABNORMAL LOW (ref >60)
Glucose, Bld: 144 mg/dL — ABNORMAL HIGH (ref 70–99)
Potassium: 3.5 mmol/L (ref 3.5–5.1)
Sodium: 140 mmol/L (ref 135–145)
Total Bilirubin: 0.7 mg/dL (ref 0.3–1.2)
Total Protein: 7.1 g/dL (ref 6.5–8.1)

## 2020-02-06 LAB — SALICYLATE LEVEL: Salicylate Lvl: <7 mg/dL — ABNORMAL LOW (ref 7.0–30.0)

## 2020-02-06 NOTE — ED Triage Notes (Signed)
Pt brought to ED by [facility] for a panic attack, per EMS pt thinks that he double the dose of his pain medication Oxy IR, pt medicated him self with intranasal Narcan at home, per pt he is feeling flush and irritable at this time, pt is able to talk on completed sentences, AO x 4, Vitals WNL. Per EMS his pain medication looks mostly full at home. BP 146/82, HR 86, SPO2 99% RA.

## 2020-02-06 NOTE — ED Notes (Signed)
Pt states he feels 100% better than he did so he is going home. Says he is understanding that some people are sicker than he is and really needs a doctor and he doesn't feel like he needs one any longer

## 2020-02-09 DIAGNOSIS — G4733 Obstructive sleep apnea (adult) (pediatric): Secondary | ICD-10-CM | POA: Diagnosis not present

## 2020-02-11 DIAGNOSIS — R69 Illness, unspecified: Secondary | ICD-10-CM | POA: Diagnosis not present

## 2020-02-11 DIAGNOSIS — Z6833 Body mass index (BMI) 33.0-33.9, adult: Secondary | ICD-10-CM | POA: Diagnosis not present

## 2020-02-19 DIAGNOSIS — G894 Chronic pain syndrome: Secondary | ICD-10-CM | POA: Diagnosis not present

## 2020-02-19 DIAGNOSIS — M961 Postlaminectomy syndrome, not elsewhere classified: Secondary | ICD-10-CM | POA: Diagnosis not present

## 2020-02-19 DIAGNOSIS — M47812 Spondylosis without myelopathy or radiculopathy, cervical region: Secondary | ICD-10-CM | POA: Diagnosis not present

## 2020-02-19 DIAGNOSIS — Z79891 Long term (current) use of opiate analgesic: Secondary | ICD-10-CM | POA: Diagnosis not present

## 2020-02-19 DIAGNOSIS — M47817 Spondylosis without myelopathy or radiculopathy, lumbosacral region: Secondary | ICD-10-CM | POA: Diagnosis not present

## 2020-03-09 DIAGNOSIS — R69 Illness, unspecified: Secondary | ICD-10-CM | POA: Diagnosis not present

## 2020-03-09 DIAGNOSIS — Z23 Encounter for immunization: Secondary | ICD-10-CM | POA: Diagnosis not present

## 2020-03-09 DIAGNOSIS — Z6833 Body mass index (BMI) 33.0-33.9, adult: Secondary | ICD-10-CM | POA: Diagnosis not present

## 2020-03-09 DIAGNOSIS — Z0000 Encounter for general adult medical examination without abnormal findings: Secondary | ICD-10-CM | POA: Diagnosis not present

## 2020-03-10 DIAGNOSIS — M47817 Spondylosis without myelopathy or radiculopathy, lumbosacral region: Secondary | ICD-10-CM | POA: Diagnosis not present

## 2020-03-10 DIAGNOSIS — G894 Chronic pain syndrome: Secondary | ICD-10-CM | POA: Diagnosis not present

## 2020-03-10 DIAGNOSIS — M47812 Spondylosis without myelopathy or radiculopathy, cervical region: Secondary | ICD-10-CM | POA: Diagnosis not present

## 2020-03-10 DIAGNOSIS — M961 Postlaminectomy syndrome, not elsewhere classified: Secondary | ICD-10-CM | POA: Diagnosis not present

## 2020-03-13 DIAGNOSIS — R69 Illness, unspecified: Secondary | ICD-10-CM | POA: Diagnosis not present

## 2020-03-13 DIAGNOSIS — I10 Essential (primary) hypertension: Secondary | ICD-10-CM | POA: Diagnosis not present

## 2020-03-13 DIAGNOSIS — E7849 Other hyperlipidemia: Secondary | ICD-10-CM | POA: Diagnosis not present

## 2020-04-19 DIAGNOSIS — M47817 Spondylosis without myelopathy or radiculopathy, lumbosacral region: Secondary | ICD-10-CM | POA: Diagnosis not present

## 2020-04-19 DIAGNOSIS — Z20828 Contact with and (suspected) exposure to other viral communicable diseases: Secondary | ICD-10-CM | POA: Diagnosis not present

## 2020-04-19 DIAGNOSIS — M961 Postlaminectomy syndrome, not elsewhere classified: Secondary | ICD-10-CM | POA: Diagnosis not present

## 2020-04-19 DIAGNOSIS — R059 Cough, unspecified: Secondary | ICD-10-CM | POA: Diagnosis not present

## 2020-04-19 DIAGNOSIS — M47812 Spondylosis without myelopathy or radiculopathy, cervical region: Secondary | ICD-10-CM | POA: Diagnosis not present

## 2020-04-19 DIAGNOSIS — G894 Chronic pain syndrome: Secondary | ICD-10-CM | POA: Diagnosis not present

## 2020-04-26 DIAGNOSIS — H11823 Conjunctivochalasis, bilateral: Secondary | ICD-10-CM | POA: Diagnosis not present

## 2020-04-26 DIAGNOSIS — H52203 Unspecified astigmatism, bilateral: Secondary | ICD-10-CM | POA: Diagnosis not present

## 2020-04-26 DIAGNOSIS — H02834 Dermatochalasis of left upper eyelid: Secondary | ICD-10-CM | POA: Diagnosis not present

## 2020-04-26 DIAGNOSIS — H524 Presbyopia: Secondary | ICD-10-CM | POA: Diagnosis not present

## 2020-04-26 DIAGNOSIS — H02831 Dermatochalasis of right upper eyelid: Secondary | ICD-10-CM | POA: Diagnosis not present

## 2020-04-26 DIAGNOSIS — H2513 Age-related nuclear cataract, bilateral: Secondary | ICD-10-CM | POA: Diagnosis not present

## 2020-04-26 DIAGNOSIS — H25013 Cortical age-related cataract, bilateral: Secondary | ICD-10-CM | POA: Diagnosis not present

## 2020-04-26 DIAGNOSIS — H5203 Hypermetropia, bilateral: Secondary | ICD-10-CM | POA: Diagnosis not present

## 2020-04-26 DIAGNOSIS — H43393 Other vitreous opacities, bilateral: Secondary | ICD-10-CM | POA: Diagnosis not present

## 2020-04-27 DIAGNOSIS — Z6833 Body mass index (BMI) 33.0-33.9, adult: Secondary | ICD-10-CM | POA: Diagnosis not present

## 2020-04-27 DIAGNOSIS — K5903 Drug induced constipation: Secondary | ICD-10-CM | POA: Diagnosis not present

## 2020-04-27 DIAGNOSIS — R69 Illness, unspecified: Secondary | ICD-10-CM | POA: Diagnosis not present

## 2020-04-27 DIAGNOSIS — T402X5A Adverse effect of other opioids, initial encounter: Secondary | ICD-10-CM | POA: Diagnosis not present

## 2020-05-11 DIAGNOSIS — G4733 Obstructive sleep apnea (adult) (pediatric): Secondary | ICD-10-CM | POA: Diagnosis not present

## 2020-05-19 DIAGNOSIS — M961 Postlaminectomy syndrome, not elsewhere classified: Secondary | ICD-10-CM | POA: Diagnosis not present

## 2020-05-19 DIAGNOSIS — M47812 Spondylosis without myelopathy or radiculopathy, cervical region: Secondary | ICD-10-CM | POA: Diagnosis not present

## 2020-05-19 DIAGNOSIS — G894 Chronic pain syndrome: Secondary | ICD-10-CM | POA: Diagnosis not present

## 2020-05-19 DIAGNOSIS — M47817 Spondylosis without myelopathy or radiculopathy, lumbosacral region: Secondary | ICD-10-CM | POA: Diagnosis not present

## 2020-05-25 DIAGNOSIS — R69 Illness, unspecified: Secondary | ICD-10-CM | POA: Diagnosis not present

## 2020-05-25 DIAGNOSIS — Z6834 Body mass index (BMI) 34.0-34.9, adult: Secondary | ICD-10-CM | POA: Diagnosis not present

## 2020-06-03 DIAGNOSIS — J209 Acute bronchitis, unspecified: Secondary | ICD-10-CM | POA: Diagnosis not present

## 2020-06-03 DIAGNOSIS — Z20822 Contact with and (suspected) exposure to covid-19: Secondary | ICD-10-CM | POA: Diagnosis not present

## 2020-06-08 DIAGNOSIS — Z0100 Encounter for examination of eyes and vision without abnormal findings: Secondary | ICD-10-CM | POA: Diagnosis not present

## 2020-06-08 DIAGNOSIS — H52203 Unspecified astigmatism, bilateral: Secondary | ICD-10-CM | POA: Diagnosis not present

## 2020-06-08 DIAGNOSIS — H5010 Unspecified exotropia: Secondary | ICD-10-CM | POA: Diagnosis not present

## 2020-06-08 DIAGNOSIS — H5203 Hypermetropia, bilateral: Secondary | ICD-10-CM | POA: Diagnosis not present

## 2020-06-08 DIAGNOSIS — H2513 Age-related nuclear cataract, bilateral: Secondary | ICD-10-CM | POA: Diagnosis not present

## 2020-06-08 DIAGNOSIS — H524 Presbyopia: Secondary | ICD-10-CM | POA: Diagnosis not present

## 2020-06-08 DIAGNOSIS — H5111 Convergence insufficiency: Secondary | ICD-10-CM | POA: Diagnosis not present

## 2020-06-15 ENCOUNTER — Ambulatory Visit: Payer: Medicare Other | Admitting: Adult Health

## 2020-06-15 NOTE — Progress Notes (Deleted)
[facility] [address] [place] [address] [phone]       STROKE FOLLOW UP NOTE  [name] Date of Birth:  February 26, 1941 Medical Record Number:  [id number]   Reason for Referral: stroke follow up    CHIEF COMPLAINT:  No chief complaint on file.   HPI:    Today, 06/15/2020, [name] requested todays visit ***.       Stroke Admission 10/18/2018: [name] is a 80 y.o. male with history of sleep apnea, neuropathy, hyperlipidemia, lumbar radicular pain, hypertension   who presented to [facility] ED on 10/18/2018 with intermittent diplopia on the right side of his vision.  He did not receive IV t-PA due to late presentation (>4.5 hours from time of onset).  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  CT head unremarkable.  Unable to perform MRI due to spinal cord stimulator.  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  2D echo unremarkable.  LDL 159 and A1c 5.9.  Initiated atorvastatin 40 mg daily.  HTN stable.  Recommended DAPT for 3 weeks and aspirin 81 mg alone.  Other stroke risk factors include advanced age, obesity and OSA.  Discharged home in stable condition without therapy needs.  Update 11/28/2018: Stable from stroke standpoint without new or recurring stroke/TIA symptoms Returned back to all prior activities without difficulties  Completed 3 weeks DAPT and continues on aspirin alone without bleeding or bruising He attempted to take lipitor but due to side effects, he was started on Crestor by PCP. He has been able to tolerate Crestor without myalgias.  Blood pressure 159/75 - typically lower at home Ongoing compliance with CPAP for OSA  No further concerns at this time  Update 06/17/2019: Mr Sharp is doing well. He has not had any new or recurring stroke/TIA  symptoms. He has continued to take Asprin without any bleeding or bruising. Has continued to take Crestor without any side effects of myalgias. Blood pressure today 128/72. Continued compliance with CPAP for OSA. No other questions or concerns at this time.      ROS:   14 system review of systems performed and negative with exception of no complaints  PMH:  Past Medical History:  Diagnosis Date  . Allergic rhinitis   . Anxiety   . BPH (benign prostatic hyperplasia)   . Chronic pain syndrome   . Hypertension   . Lumbar radicular pain   . Major depressive disorder   . Mixed hyperlipidemia   . Neuropathy   . Sleep apnea     PSH:  Past Surgical History:  Procedure Laterality Date  . BACK SURGERY    . CERVICAL FUSION    . colonoscopy     reported as normal  . LUMBAR NERVE STIMLATOR INSERTION    . THYROIDECTOMY      Social History:  Social History   Socioeconomic History  . Marital status: Married    Spouse name: Not on file  . Number of children: Not on file  . Years of education: Not on file  . Highest education level: Not on file  Occupational History  . Occupation: Theme park manager    Comment: Ronald [name] of a [employer] for over 40 years  Tobacco Use  . Smoking status: Never Smoker  . Smokeless tobacco: Never Used  Vaping Use  .  Vaping Use: Never used  Substance and Sexual Activity  . Alcohol use: No  . Drug use: No  . Sexual activity: Yes    Birth control/protection: None  Other Topics Concern  . Not on file  Social History Narrative   Ronald [name] at [employer] - lives in [place]   Lives with Wife and    Social Determinants of Health   Financial Resource Strain: Not on file  Food Insecurity: Not on file  Transportation Needs: Not on file  Physical Activity: Not on file  Stress: Not on file  Social Connections: Not on file  Intimate Partner Violence: Not on file    Family History:  Family History  Problem Relation Age of Onset  . Coronary artery disease  Father 24       CABG  . Hypertension Father   . Diabetes Father     Medications:   Current Outpatient Medications on File Prior to Visit  Medication Sig Dispense Refill  . amLODipine (NORVASC) 5 MG tablet Take 5 mg by mouth daily.    Marland Kitchen aspirin EC 81 MG EC tablet Take 1 tablet (81 mg total) by mouth daily.    . DULoxetine (CYMBALTA) 60 MG capsule Take 60 mg by mouth daily.    . furosemide (LASIX) 20 MG tablet Take 20 mg by mouth daily.     Marland Kitchen gabapentin (NEURONTIN) 300 MG capsule Take 300 mg by mouth 3 (three) times daily as needed (pain).     Marland Kitchen oxyCODONE (ROXICODONE) 15 MG immediate release tablet Take 15 mg by mouth every 4 (four) hours as needed for pain. Up to 7 tablets in a day.    . pantoprazole (PROTONIX) 40 MG tablet Take 40 mg by mouth daily.    Marland Kitchen rOPINIRole (REQUIP) 0.5 MG tablet Take 2 mg by mouth at bedtime.   2  . rosuvastatin (CRESTOR) 5 MG tablet 1 (ONE) TABLET EVERY MONDAY, WEDNESDAY, FRIDAY    . tamsulosin (FLOMAX) 0.4 MG CAPS Take 0.4 mg by mouth 2 (two) times a day.     . telmisartan (MICARDIS) 40 MG tablet Take 40 mg by mouth daily. for high blood pressure     No current facility-administered medications on file prior to visit.    Allergies:   Allergies  Allergen Reactions  . Phenobarbital Rash     Physical Exam  There were no vitals filed for this visit. There is no height or weight on file to calculate BMI. No exam data present  General: well developed, well nourished,  pleasant elderly Caucasian male, seated, in no evident distress Head: head normocephalic and atraumatic.   Neck: supple with no carotid or supraclavicular bruits Cardiovascular: regular rate and rhythm, no murmurs Musculoskeletal: no deformity Skin:  no rash/petichiae Vascular:  Normal pulses all extremities   Neurologic Exam Mental Status: Awake and fully alert. Oriented to place and time. Recent and remote memory intact. Attention span, concentration and fund of knowledge  appropriate. Mood and affect appropriate.  Cranial Nerves: Pupils equal, briskly reactive to light. Extraocular movements full without nystagmus. Visual fields full to confrontation. Hearing intact. Facial sensation intact. Face, tongue, palate moves normally and symmetrically.  Motor: Normal bulk and tone. Normal strength in all tested extremity muscles. Sensory.: intact to touch , pinprick , position and vibratory sensation.  Coordination: Rapid alternating movements normal in all extremities. Finger-to-nose and heel-to-shin performed accurately bilaterally. Gait and Station: Arises from chair without difficulty. Stance is normal. Gait demonstrates normal stride length and balance Reflexes:  1+ and symmetric. Toes downgoing.      Diagnostic Data (Labs, Imaging, Testing)  Ct Angio Head W Or Wo Contrast  Ct Angio Neck W And/or Wo Contrast 10/18/2018 IMPRESSION:  1. 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 Are there left side vision symptoms to suggest right PCA territory ischemia?  2. Arterial tortuosity and atherosclerosis in the head and neck with no other significant stenosis and no large vessel occlusion identified.  3. Stable noncontrast CT appearance of the brain since 2017. No acute cortically based infarct or intracranial hemorrhage. 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  4. 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   Dg Chest 2 View 10/18/2018 IMPRESSION:  1.  Stable cardiomegaly.  No pulmonary venous congestion.  2.  Mild bibasilar atelectasis.  3. Cervicothoracic spine scoliosis and degenerative change. Neurostimulator noted in stable position over thoracic spine.  4.  Carotid vascular disease.   MRI Brain Wo Contrast -not possible due to spinal cord stimulator     Transthoracic  Echocardiogram  10/19/2018 Study Conclusions - Left ventricle: The cavity size was normal. Wall thickness was normal. Systolic function was normal. The estimated ejection fraction was in the range of 60% to 65%. Wall motion was normal; there were no regional wall motion abnormalities. Doppler parameters are consistent with abnormal left ventricular relaxation (grade 1 diastolic dysfunction). Impressions: - 1. Normal systolic function. Impaired relaxation. 2. Mild MR.   EKG - SR rate 77 BPM. (See cardiology reading for complete details)   ASSESSMENT: MAKAYLA STEVENS is a 80 y.o. 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 He is being seen today and is doing well. Stable from stroke standpoint without new or recurring stroke/TIA symptoms.     PLAN:  1. TIA: Continue aspirin 81 mg daily  and crestor for secondary stroke prevention. Maintain strict control of hypertension with blood pressure goal below 130/90, diabetes with hemoglobin A1c goal below 6.5% and cholesterol with LDL cholesterol (bad cholesterol) goal below 70 mg/dL.  I also advised the patient to eat a healthy diet with plenty of whole grains, cereals, fruits and vegetables, exercise regularly with at least 30 minutes of continuous activity daily and maintain ideal body weight. 2. HTN: Advised to continue current treatment regimen.  Today's BP 128/72.  Advised to continue to monitor at home along with continued follow-up with PCP for management 3. HLD: Advised to continue current treatment regimen along with continued follow-up with PCP for future prescribing and monitoring of lipid panel 4. OSA on CPAP: Ongoing compliance    Follow up as needed as patient stable from  stroke standpoint - advised to call office in the future with questions or concerns regarding prior stroke    Greater than 50% of time during this 20 minute visit was spent on counseling, explanation of diagnosis of TIA, reviewing risk factor management of HTN, OSA on CPAP and HLD, planning of further management along with potential future management, and discussion with patient answering all questions to patients satisfaction      Frann Rider, [facility]  [facility] [address] [place], [place] [address]  Phone [phone] Fax [phone] Note: This document was prepared with digital dictation and possible smart phrase technology. Any transcriptional errors that result from this process are unintentional.

## 2020-06-18 DIAGNOSIS — R69 Illness, unspecified: Secondary | ICD-10-CM | POA: Diagnosis not present

## 2020-06-18 DIAGNOSIS — I10 Essential (primary) hypertension: Secondary | ICD-10-CM | POA: Diagnosis not present

## 2020-06-18 DIAGNOSIS — H532 Diplopia: Secondary | ICD-10-CM | POA: Diagnosis not present

## 2020-06-18 DIAGNOSIS — I6621 Occlusion and stenosis of right posterior cerebral artery: Secondary | ICD-10-CM | POA: Diagnosis not present

## 2020-06-18 DIAGNOSIS — Z6833 Body mass index (BMI) 33.0-33.9, adult: Secondary | ICD-10-CM | POA: Diagnosis not present

## 2020-07-12 DIAGNOSIS — I6621 Occlusion and stenosis of right posterior cerebral artery: Secondary | ICD-10-CM | POA: Diagnosis not present

## 2020-07-12 DIAGNOSIS — R69 Illness, unspecified: Secondary | ICD-10-CM | POA: Diagnosis not present

## 2020-07-12 DIAGNOSIS — F4010 Social phobia, unspecified: Secondary | ICD-10-CM | POA: Diagnosis not present

## 2020-07-15 DIAGNOSIS — M47817 Spondylosis without myelopathy or radiculopathy, lumbosacral region: Secondary | ICD-10-CM | POA: Diagnosis not present

## 2020-07-15 DIAGNOSIS — M961 Postlaminectomy syndrome, not elsewhere classified: Secondary | ICD-10-CM | POA: Diagnosis not present

## 2020-07-15 DIAGNOSIS — M47812 Spondylosis without myelopathy or radiculopathy, cervical region: Secondary | ICD-10-CM | POA: Diagnosis not present

## 2020-07-15 DIAGNOSIS — G894 Chronic pain syndrome: Secondary | ICD-10-CM | POA: Diagnosis not present

## 2020-07-27 ENCOUNTER — Ambulatory Visit: Payer: Medicare HMO | Admitting: Neurology

## 2020-07-27 ENCOUNTER — Encounter: Payer: Self-pay | Admitting: Neurology

## 2020-07-27 VITALS — BP 112/60 | HR 81 | Ht 66.5 in | Wt 217.0 lb

## 2020-07-27 DIAGNOSIS — R5382 Chronic fatigue, unspecified: Secondary | ICD-10-CM

## 2020-07-27 DIAGNOSIS — Z5181 Encounter for therapeutic drug level monitoring: Secondary | ICD-10-CM | POA: Diagnosis not present

## 2020-07-27 DIAGNOSIS — E538 Deficiency of other specified B group vitamins: Secondary | ICD-10-CM

## 2020-07-27 DIAGNOSIS — H532 Diplopia: Secondary | ICD-10-CM

## 2020-07-27 NOTE — Progress Notes (Signed)
Reason for visit: Transient double vision  Referring physician: Dr. Eppie Gibson is a 80 y.o. Ronald [name]  History of present illness:  Ronald Ronald [name] with a history of hypertension, dyslipidemia, sleep apnea, and history of peripheral neuropathy.  The patient has a spinal stimulator in place, he cannot have MRI evaluation.  He was seen on 18 October 2018 with an onset of double vision.  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  He has been on low-dose aspirin since that time.  He had recurrence of a similar event that started on 18 June 2020, this episode lasted about 15 days with gradual improvement.  During the event, he noted both vertical and horizontal separation of the visual objects, he had severe fatigue, and when he was walking he had a tendency to veer to the right.  He did not have any falls.  He reported no headache at that time but over the last 2 or 3 days he has had an occipital headache.  He denies any numbness or weakness of the extremities or face.  He denies any speech or swallowing changes.  He denies any droopiness of the eyelids.  He has not had any blackout episodes.  He is sent to this office for further evaluation.  His primary doctor added Plavix to his aspirin.  Past Medical History:  Diagnosis Date  . Allergic rhinitis   . Anxiety   . BPH (benign prostatic hyperplasia)   . Chronic pain syndrome   . Hypertension   . Lumbar radicular pain   . Major depressive disorder   . Mixed hyperlipidemia   . Neuropathy   . Sleep apnea     Past Surgical History:  Procedure Laterality Date  . BACK SURGERY    . CERVICAL FUSION    . colonoscopy     reported as normal  . LUMBAR NERVE STIMLATOR INSERTION    . THYROIDECTOMY      Family History  Problem Relation Age of Onset  . Coronary artery disease Father 64       CABG  . Hypertension Father   .  Diabetes Father     Social history:  reports that he has never smoked. He has never used smokeless tobacco. He reports that he does not drink alcohol and does not use drugs.  Medications:  Prior to Admission medications   Medication Sig Start Date End Date Taking? Authorizing Provider  amLODipine (NORVASC) 5 MG tablet Take 5 mg by mouth daily.   Yes [provider]  aspirin EC 81 MG EC tablet Take 1 tablet (81 mg total) by mouth daily. 11/07/12  Yes Gerda Diss, DO  DULoxetine (CYMBALTA) 60 MG capsule Take 60 mg by mouth daily.   Yes [provider]  furosemide (LASIX) 20 MG tablet Take 20 mg by mouth daily.  01/19/17  Yes [provider]  oxyCODONE (ROXICODONE) 15 MG immediate release tablet Take 15 mg by mouth every 4 (four) hours as needed for pain. Up to 7 tablets in a day.   Yes [provider]  pantoprazole (PROTONIX) 40 MG tablet Take 40 mg by mouth daily. 06/07/19  Yes [provider]  rOPINIRole (REQUIP) 0.5 MG tablet Take 2 mg by mouth at bedtime.  12/03/17  Yes [provider]  rosuvastatin (CRESTOR) 5 MG tablet 1 (ONE) TABLET EVERY MONDAY, WEDNESDAY,  FRIDAY 11/21/18  Yes [provider]  tamsulosin (FLOMAX) 0.4 MG CAPS Take 0.4 mg by mouth 2 (two) times a day.    Yes [provider]  telmisartan (MICARDIS) 40 MG tablet Take 40 mg by mouth daily. for high blood pressure 11/04/18  Yes [provider]  gabapentin (NEURONTIN) 300 MG capsule Take 300 mg by mouth 3 (three) times daily as needed (pain).     [provider]      Allergies  Allergen Reactions  . Phenobarbital Rash    ROS:  Out of a complete 14 system review of symptoms, the patient complains only of the following symptoms, and all other reviewed systems are negative.  Transient double vision Headache, neck pain  Blood pressure 112/60, pulse 81, height 5' 6.5" (1.689 m), weight 217 lb (98.4 kg).  Physical Exam  General: The  patient is alert and cooperative at the time of the examination.  The patient is moderately obese.  Eyes: Pupils are equal, round, and reactive to light.  Pupils are pinpoint.  Discs are flat bilaterally.  Neck: The neck is supple, a right carotid bruit was noted.  Respiratory: The respiratory examination is clear.  Cardiovascular: The cardiovascular examination reveals a regular rate and rhythm, no obvious murmurs or rubs are noted.  Skin: Extremities are without significant edema.  Neurologic Exam  Mental status: The patient is alert and oriented x 3 at the time of the examination. The patient has apparent normal recent and remote memory, with an apparently normal attention span and concentration ability.  Cranial nerves: Facial symmetry is present. There is good sensation of the face to pinprick and soft touch bilaterally. The strength of the facial muscles and the muscles to head turning and shoulder shrug are normal bilaterally. Speech is well enunciated, no aphasia or dysarthria is noted. Extraocular movements are full. Visual fields are full. The tongue is midline, and the patient has symmetric elevation of the soft palate. No obvious hearing deficits are noted.  Motor: The motor testing reveals 5 over 5 strength of all 4 extremities. Good symmetric motor tone is noted throughout.  Sensory: Sensory testing is intact to pinprick, soft touch, vibration sensation, and position sense on all 4 extremities. No evidence of extinction is noted.  Coordination: Cerebellar testing reveals good finger-nose-finger and heel-to-shin bilaterally.  Gait and station: Gait is normal. Tandem gait is normal. Romberg is negative. No drift is seen.  Reflexes: Deep tendon reflexes are symmetric and normal bilaterally. Toes are downgoing bilaterally.   Assessment/Plan:  1.  Transient double vision  2.  Right carotid bruit  The patient has had recurrence of his double vision that lasted about 2 weeks  and then cleared.  The patient will be sent for blood work today, we will set him up again for CT angiogram of the head and neck.  He cannot have MRI study.  He will follow up here in 3 to 4 months.  He will stop the Plavix after 3 weeks of therapy.  Jill Alexanders MD 07/27/2020 2:18 PM  [facility] [address] [place], [place] [address]  Phone [phone] Fax [phone]

## 2020-07-28 ENCOUNTER — Telehealth: Payer: Self-pay | Admitting: Neurology

## 2020-07-28 LAB — ACETYLCHOLINE RECEPTOR, BINDING: AChR Binding Ab, Serum: <0.03 nmol/L (ref 0.00–0.24)

## 2020-07-28 LAB — COMPREHENSIVE METABOLIC PANEL
ALT: 12 IU/L (ref 0–44)
AST: 12 IU/L (ref 0–40)
Albumin/Globulin Ratio: 2.3 — ABNORMAL HIGH (ref 1.2–2.2)
Albumin: 4.6 g/dL (ref 3.6–4.6)
Alkaline Phosphatase: 88 IU/L (ref 44–121)
BUN/Creatinine Ratio: 16 (ref 10–24)
BUN: 25 mg/dL (ref 8–27)
Bilirubin Total: 0.3 mg/dL (ref 0.0–1.2)
CO2: 24 mmol/L (ref 20–29)
Calcium: 9.1 mg/dL (ref 8.6–10.2)
Chloride: 101 mmol/L (ref 96–106)
Creatinine, Ser: 1.58 mg/dL — ABNORMAL HIGH (ref 0.76–1.27)
Globulin, Total: 2 g/dL (ref 1.5–4.5)
Glucose: 116 mg/dL — ABNORMAL HIGH (ref 65–99)
Potassium: 4 mmol/L (ref 3.5–5.2)
Sodium: 142 mmol/L (ref 134–144)
Total Protein: 6.6 g/dL (ref 6.0–8.5)
eGFR: 44 mL/min/{1.73_m2} — ABNORMAL LOW (ref >59)

## 2020-07-28 LAB — VITAMIN B12: Vitamin B-12: 417 pg/mL (ref 232–1245)

## 2020-07-28 LAB — TSH: TSH: 1.66 u[IU]/mL (ref 0.450–4.500)

## 2020-07-28 LAB — SEDIMENTATION RATE: Sed Rate: 2 mm/hr (ref 0–30)

## 2020-07-28 NOTE — Telephone Encounter (Signed)
[employer] medicare order sent to [facility]. They will obtain the auth and reach out to the patient to schedule.  °

## 2020-08-02 ENCOUNTER — Ambulatory Visit: Payer: Medicare HMO | Admitting: Neurology

## 2020-08-03 ENCOUNTER — Telehealth: Payer: Self-pay | Admitting: *Deleted

## 2020-08-03 NOTE — Telephone Encounter (Signed)
LVM informing him his blood work is unremarkable with exception of mild chronic stable renal insufficiency. No other abnormalities are noted. Left # for questions.

## 2020-08-05 NOTE — Telephone Encounter (Signed)
Betsy at [facility] informed me that [employer] is not approve the CTA's.  I called [employer] and they said a different doctor on file has them already approved they could not tell me the doctor name due to HIPPA purpose's.  I called the patient to ask him if another doctor has order the CTA's. He stated his primary care doctor Dr. Humphrey Rolls may have ordered them. I left a voicemail with Christy and Dr. Humphrey Rolls office to give me a call back to see if those were order because if so the patient would have to have the CTA"s under that doctor because they are already approved and the insurance is not going to approve the same studies for two different doctors.

## 2020-08-06 DIAGNOSIS — D649 Anemia, unspecified: Secondary | ICD-10-CM | POA: Diagnosis not present

## 2020-08-06 DIAGNOSIS — R110 Nausea: Secondary | ICD-10-CM | POA: Diagnosis not present

## 2020-08-06 DIAGNOSIS — R519 Headache, unspecified: Secondary | ICD-10-CM | POA: Diagnosis not present

## 2020-08-06 DIAGNOSIS — R5383 Other fatigue: Secondary | ICD-10-CM | POA: Diagnosis not present

## 2020-08-06 DIAGNOSIS — M791 Myalgia, unspecified site: Secondary | ICD-10-CM | POA: Diagnosis not present

## 2020-08-09 NOTE — Telephone Encounter (Signed)
Mardene Celeste with Dr. Humphrey Rolls office called me back and left me a voicemail on my phone stating that the insurance did not approve their CTA's either.  I called her back and left her a voicemail asking her if she didn't mind calling the insurance to see if it ended up getting approve or to withdraw their case because we cannot get them approved because of their case.

## 2020-08-10 ENCOUNTER — Other Ambulatory Visit: Payer: Self-pay | Admitting: Family Medicine

## 2020-08-10 DIAGNOSIS — G4733 Obstructive sleep apnea (adult) (pediatric): Secondary | ICD-10-CM | POA: Diagnosis not present

## 2020-08-10 NOTE — Telephone Encounter (Signed)
Mardene Celeste from Dr. Humphrey Rolls office called me and informed me she is unable to withdraw their case of the CTA Head  since it has already been approved they changed the sight location to [facility]. So the patient can have both studies at [facility] it just the CTA head is approved under Dr. Humphrey Rolls (pcp) the CTA Neck is approved under Dr. Jannifer Franklin.  CTA Head auth: [id number] (exp. 12/29/20) under Dr. Humphrey Rolls CTA Neck auth: [id number] (exp. 01/31/21) under Dr. Jannifer Franklin.  The patient is schedule to have both studies done on Friday 08/13/20 at [facility].

## 2020-08-13 ENCOUNTER — Ambulatory Visit
Admission: RE | Admit: 2020-08-13 | Discharge: 2020-08-13 | Disposition: A | Discharge status: Home / Self Care | Payer: Medicare HMO | Source: Ambulatory Visit | Attending: Neurology | Admitting: Neurology

## 2020-08-13 DIAGNOSIS — I6623 Occlusion and stenosis of bilateral posterior cerebral arteries: Secondary | ICD-10-CM | POA: Diagnosis not present

## 2020-08-13 DIAGNOSIS — I6523 Occlusion and stenosis of bilateral carotid arteries: Secondary | ICD-10-CM | POA: Diagnosis not present

## 2020-08-13 DIAGNOSIS — I672 Cerebral atherosclerosis: Secondary | ICD-10-CM | POA: Diagnosis not present

## 2020-08-13 DIAGNOSIS — H532 Diplopia: Secondary | ICD-10-CM

## 2020-08-13 MED ORDER — IOPAMIDOL (ISOVUE-370) INJECTION 76%
60.0000 mL | Freq: Once | INTRAVENOUS | Status: AC | PRN
  Administered 2020-08-13: 60 mL via INTRAVENOUS

## 2020-08-14 ENCOUNTER — Telehealth: Payer: Self-pay | Admitting: Neurology

## 2020-08-14 NOTE — Telephone Encounter (Signed)
  I called the patient.  The CT angiogram of the head and neck did not show any severe blockages.  No source of TIA/double vision noted.  CTA head and neck 08/13/20:  IMPRESSION: 1. Stable appearance of moderate right and mild left P2 segment stenosis. 2. No other significant proximal stenosis, aneurysm, or branch vessel occlusion within the Circle of [place]. 3. Atherosclerotic changes at the carotid bifurcations bilaterally and cavernous internal carotid arteries bilaterally without significant stenosis relative to the more distal vessels. 4. Stable remote basal ganglia infarct. 5. No acute intracranial abnormality. 6. Left temporal fossa arachnoid cyst is stable. 7. Solid anterior fusion at C6-7. 8. Stable degenerative changes of the cervical spine. 9. Aortic Atherosclerosis (ICD10-I70.0).

## 2020-08-16 DIAGNOSIS — M961 Postlaminectomy syndrome, not elsewhere classified: Secondary | ICD-10-CM | POA: Diagnosis not present

## 2020-08-16 DIAGNOSIS — M47812 Spondylosis without myelopathy or radiculopathy, cervical region: Secondary | ICD-10-CM | POA: Diagnosis not present

## 2020-08-16 DIAGNOSIS — M47817 Spondylosis without myelopathy or radiculopathy, lumbosacral region: Secondary | ICD-10-CM | POA: Diagnosis not present

## 2020-08-16 DIAGNOSIS — G894 Chronic pain syndrome: Secondary | ICD-10-CM | POA: Diagnosis not present

## 2020-08-17 DIAGNOSIS — Z6833 Body mass index (BMI) 33.0-33.9, adult: Secondary | ICD-10-CM | POA: Diagnosis not present

## 2020-08-17 DIAGNOSIS — I6621 Occlusion and stenosis of right posterior cerebral artery: Secondary | ICD-10-CM | POA: Diagnosis not present

## 2020-08-17 DIAGNOSIS — R69 Illness, unspecified: Secondary | ICD-10-CM | POA: Diagnosis not present

## 2020-08-17 DIAGNOSIS — G4733 Obstructive sleep apnea (adult) (pediatric): Secondary | ICD-10-CM | POA: Diagnosis not present

## 2020-09-13 DIAGNOSIS — M47812 Spondylosis without myelopathy or radiculopathy, cervical region: Secondary | ICD-10-CM | POA: Diagnosis not present

## 2020-09-13 DIAGNOSIS — M961 Postlaminectomy syndrome, not elsewhere classified: Secondary | ICD-10-CM | POA: Diagnosis not present

## 2020-09-13 DIAGNOSIS — M47817 Spondylosis without myelopathy or radiculopathy, lumbosacral region: Secondary | ICD-10-CM | POA: Diagnosis not present

## 2020-09-13 DIAGNOSIS — G894 Chronic pain syndrome: Secondary | ICD-10-CM | POA: Diagnosis not present

## 2020-10-19 DIAGNOSIS — M47817 Spondylosis without myelopathy or radiculopathy, lumbosacral region: Secondary | ICD-10-CM | POA: Diagnosis not present

## 2020-10-19 DIAGNOSIS — Z79891 Long term (current) use of opiate analgesic: Secondary | ICD-10-CM | POA: Diagnosis not present

## 2020-10-19 DIAGNOSIS — M961 Postlaminectomy syndrome, not elsewhere classified: Secondary | ICD-10-CM | POA: Diagnosis not present

## 2020-10-19 DIAGNOSIS — G894 Chronic pain syndrome: Secondary | ICD-10-CM | POA: Diagnosis not present

## 2020-10-19 DIAGNOSIS — M47812 Spondylosis without myelopathy or radiculopathy, cervical region: Secondary | ICD-10-CM | POA: Diagnosis not present

## 2020-11-11 DIAGNOSIS — E785 Hyperlipidemia, unspecified: Secondary | ICD-10-CM | POA: Diagnosis not present

## 2020-11-11 DIAGNOSIS — I10 Essential (primary) hypertension: Secondary | ICD-10-CM | POA: Diagnosis not present

## 2020-11-12 DIAGNOSIS — G4733 Obstructive sleep apnea (adult) (pediatric): Secondary | ICD-10-CM | POA: Diagnosis not present

## 2020-11-18 DIAGNOSIS — M961 Postlaminectomy syndrome, not elsewhere classified: Secondary | ICD-10-CM | POA: Diagnosis not present

## 2020-11-18 DIAGNOSIS — G894 Chronic pain syndrome: Secondary | ICD-10-CM | POA: Diagnosis not present

## 2020-11-18 DIAGNOSIS — M47812 Spondylosis without myelopathy or radiculopathy, cervical region: Secondary | ICD-10-CM | POA: Diagnosis not present

## 2020-11-18 DIAGNOSIS — M47817 Spondylosis without myelopathy or radiculopathy, lumbosacral region: Secondary | ICD-10-CM | POA: Diagnosis not present

## 2020-12-01 ENCOUNTER — Ambulatory Visit: Payer: Medicare HMO | Admitting: Neurology

## 2020-12-01 ENCOUNTER — Telehealth: Payer: Self-pay | Admitting: Neurology

## 2020-12-01 ENCOUNTER — Encounter: Payer: Self-pay | Admitting: Neurology

## 2020-12-01 NOTE — Telephone Encounter (Signed)
This patient did not show for a revisit appointment today. 

## 2020-12-15 DIAGNOSIS — G894 Chronic pain syndrome: Secondary | ICD-10-CM | POA: Diagnosis not present

## 2020-12-15 DIAGNOSIS — M47812 Spondylosis without myelopathy or radiculopathy, cervical region: Secondary | ICD-10-CM | POA: Diagnosis not present

## 2020-12-15 DIAGNOSIS — M47817 Spondylosis without myelopathy or radiculopathy, lumbosacral region: Secondary | ICD-10-CM | POA: Diagnosis not present

## 2020-12-15 DIAGNOSIS — M961 Postlaminectomy syndrome, not elsewhere classified: Secondary | ICD-10-CM | POA: Diagnosis not present

## 2021-01-19 DIAGNOSIS — M961 Postlaminectomy syndrome, not elsewhere classified: Secondary | ICD-10-CM | POA: Diagnosis not present

## 2021-01-19 DIAGNOSIS — M47812 Spondylosis without myelopathy or radiculopathy, cervical region: Secondary | ICD-10-CM | POA: Diagnosis not present

## 2021-01-19 DIAGNOSIS — G894 Chronic pain syndrome: Secondary | ICD-10-CM | POA: Diagnosis not present

## 2021-01-19 DIAGNOSIS — M47817 Spondylosis without myelopathy or radiculopathy, lumbosacral region: Secondary | ICD-10-CM | POA: Diagnosis not present

## 2021-01-26 DIAGNOSIS — R972 Elevated prostate specific antigen [PSA]: Secondary | ICD-10-CM | POA: Diagnosis not present

## 2021-01-26 DIAGNOSIS — R69 Illness, unspecified: Secondary | ICD-10-CM | POA: Diagnosis not present

## 2021-01-26 DIAGNOSIS — E7800 Pure hypercholesterolemia, unspecified: Secondary | ICD-10-CM | POA: Diagnosis not present

## 2021-01-26 DIAGNOSIS — Z79899 Other long term (current) drug therapy: Secondary | ICD-10-CM | POA: Diagnosis not present

## 2021-01-26 DIAGNOSIS — F331 Major depressive disorder, recurrent, moderate: Secondary | ICD-10-CM | POA: Diagnosis not present

## 2021-01-26 DIAGNOSIS — Z9181 History of falling: Secondary | ICD-10-CM | POA: Diagnosis not present

## 2021-01-26 DIAGNOSIS — Z6832 Body mass index (BMI) 32.0-32.9, adult: Secondary | ICD-10-CM | POA: Diagnosis not present

## 2021-02-14 DIAGNOSIS — G4733 Obstructive sleep apnea (adult) (pediatric): Secondary | ICD-10-CM | POA: Diagnosis not present

## 2021-02-18 DIAGNOSIS — M47817 Spondylosis without myelopathy or radiculopathy, lumbosacral region: Secondary | ICD-10-CM | POA: Diagnosis not present

## 2021-02-18 DIAGNOSIS — M961 Postlaminectomy syndrome, not elsewhere classified: Secondary | ICD-10-CM | POA: Diagnosis not present

## 2021-02-18 DIAGNOSIS — G894 Chronic pain syndrome: Secondary | ICD-10-CM | POA: Diagnosis not present

## 2021-02-18 DIAGNOSIS — M47812 Spondylosis without myelopathy or radiculopathy, cervical region: Secondary | ICD-10-CM | POA: Diagnosis not present

## 2021-03-17 DIAGNOSIS — M47812 Spondylosis without myelopathy or radiculopathy, cervical region: Secondary | ICD-10-CM | POA: Diagnosis not present

## 2021-03-17 DIAGNOSIS — G894 Chronic pain syndrome: Secondary | ICD-10-CM | POA: Diagnosis not present

## 2021-03-17 DIAGNOSIS — M961 Postlaminectomy syndrome, not elsewhere classified: Secondary | ICD-10-CM | POA: Diagnosis not present

## 2021-03-17 DIAGNOSIS — Z79891 Long term (current) use of opiate analgesic: Secondary | ICD-10-CM | POA: Diagnosis not present

## 2021-03-17 DIAGNOSIS — M47817 Spondylosis without myelopathy or radiculopathy, lumbosacral region: Secondary | ICD-10-CM | POA: Diagnosis not present

## 2021-04-11 DIAGNOSIS — R69 Illness, unspecified: Secondary | ICD-10-CM | POA: Diagnosis not present

## 2021-04-11 DIAGNOSIS — E1169 Type 2 diabetes mellitus with other specified complication: Secondary | ICD-10-CM | POA: Diagnosis not present

## 2021-04-11 DIAGNOSIS — E669 Obesity, unspecified: Secondary | ICD-10-CM | POA: Diagnosis not present

## 2021-04-11 DIAGNOSIS — G4733 Obstructive sleep apnea (adult) (pediatric): Secondary | ICD-10-CM | POA: Diagnosis not present

## 2021-04-11 DIAGNOSIS — M1990 Unspecified osteoarthritis, unspecified site: Secondary | ICD-10-CM | POA: Diagnosis not present

## 2021-04-11 DIAGNOSIS — Z008 Encounter for other general examination: Secondary | ICD-10-CM | POA: Diagnosis not present

## 2021-04-11 DIAGNOSIS — E1159 Type 2 diabetes mellitus with other circulatory complications: Secondary | ICD-10-CM | POA: Diagnosis not present

## 2021-04-11 DIAGNOSIS — G2581 Restless legs syndrome: Secondary | ICD-10-CM | POA: Diagnosis not present

## 2021-04-11 DIAGNOSIS — N529 Male erectile dysfunction, unspecified: Secondary | ICD-10-CM | POA: Diagnosis not present

## 2021-04-11 DIAGNOSIS — E1143 Type 2 diabetes mellitus with diabetic autonomic (poly)neuropathy: Secondary | ICD-10-CM | POA: Diagnosis not present

## 2021-04-11 DIAGNOSIS — E1142 Type 2 diabetes mellitus with diabetic polyneuropathy: Secondary | ICD-10-CM | POA: Diagnosis not present

## 2021-04-11 DIAGNOSIS — G8929 Other chronic pain: Secondary | ICD-10-CM | POA: Diagnosis not present

## 2021-04-11 DIAGNOSIS — R32 Unspecified urinary incontinence: Secondary | ICD-10-CM | POA: Diagnosis not present

## 2021-04-11 DIAGNOSIS — F419 Anxiety disorder, unspecified: Secondary | ICD-10-CM | POA: Diagnosis not present

## 2021-04-11 DIAGNOSIS — I10 Essential (primary) hypertension: Secondary | ICD-10-CM | POA: Diagnosis not present

## 2021-04-11 DIAGNOSIS — E785 Hyperlipidemia, unspecified: Secondary | ICD-10-CM | POA: Diagnosis not present

## 2021-04-11 DIAGNOSIS — G3184 Mild cognitive impairment, so stated: Secondary | ICD-10-CM | POA: Diagnosis not present

## 2021-04-20 DIAGNOSIS — G894 Chronic pain syndrome: Secondary | ICD-10-CM | POA: Diagnosis not present

## 2021-04-20 DIAGNOSIS — M47817 Spondylosis without myelopathy or radiculopathy, lumbosacral region: Secondary | ICD-10-CM | POA: Diagnosis not present

## 2021-04-20 DIAGNOSIS — M47812 Spondylosis without myelopathy or radiculopathy, cervical region: Secondary | ICD-10-CM | POA: Diagnosis not present

## 2021-04-20 DIAGNOSIS — M961 Postlaminectomy syndrome, not elsewhere classified: Secondary | ICD-10-CM | POA: Diagnosis not present

## 2021-05-03 DIAGNOSIS — H02831 Dermatochalasis of right upper eyelid: Secondary | ICD-10-CM | POA: Diagnosis not present

## 2021-05-03 DIAGNOSIS — H43393 Other vitreous opacities, bilateral: Secondary | ICD-10-CM | POA: Diagnosis not present

## 2021-05-03 DIAGNOSIS — H5203 Hypermetropia, bilateral: Secondary | ICD-10-CM | POA: Diagnosis not present

## 2021-05-03 DIAGNOSIS — H52203 Unspecified astigmatism, bilateral: Secondary | ICD-10-CM | POA: Diagnosis not present

## 2021-05-03 DIAGNOSIS — H2513 Age-related nuclear cataract, bilateral: Secondary | ICD-10-CM | POA: Diagnosis not present

## 2021-05-03 DIAGNOSIS — H02834 Dermatochalasis of left upper eyelid: Secondary | ICD-10-CM | POA: Diagnosis not present

## 2021-05-03 DIAGNOSIS — H5010 Unspecified exotropia: Secondary | ICD-10-CM | POA: Diagnosis not present

## 2021-05-03 DIAGNOSIS — H524 Presbyopia: Secondary | ICD-10-CM | POA: Diagnosis not present

## 2021-05-18 DIAGNOSIS — G4733 Obstructive sleep apnea (adult) (pediatric): Secondary | ICD-10-CM | POA: Diagnosis not present

## 2021-05-20 DIAGNOSIS — G894 Chronic pain syndrome: Secondary | ICD-10-CM | POA: Diagnosis not present

## 2021-05-20 DIAGNOSIS — Z79891 Long term (current) use of opiate analgesic: Secondary | ICD-10-CM | POA: Diagnosis not present

## 2021-05-20 DIAGNOSIS — M47812 Spondylosis without myelopathy or radiculopathy, cervical region: Secondary | ICD-10-CM | POA: Diagnosis not present

## 2021-05-20 DIAGNOSIS — M961 Postlaminectomy syndrome, not elsewhere classified: Secondary | ICD-10-CM | POA: Diagnosis not present

## 2021-05-20 DIAGNOSIS — M47817 Spondylosis without myelopathy or radiculopathy, lumbosacral region: Secondary | ICD-10-CM | POA: Diagnosis not present

## 2021-06-21 DIAGNOSIS — M47817 Spondylosis without myelopathy or radiculopathy, lumbosacral region: Secondary | ICD-10-CM | POA: Diagnosis not present

## 2021-06-21 DIAGNOSIS — M961 Postlaminectomy syndrome, not elsewhere classified: Secondary | ICD-10-CM | POA: Diagnosis not present

## 2021-06-21 DIAGNOSIS — M47812 Spondylosis without myelopathy or radiculopathy, cervical region: Secondary | ICD-10-CM | POA: Diagnosis not present

## 2021-06-21 DIAGNOSIS — G894 Chronic pain syndrome: Secondary | ICD-10-CM | POA: Diagnosis not present

## 2021-07-14 DIAGNOSIS — Z0000 Encounter for general adult medical examination without abnormal findings: Secondary | ICD-10-CM | POA: Diagnosis not present

## 2021-07-14 DIAGNOSIS — N183 Chronic kidney disease, stage 3 unspecified: Secondary | ICD-10-CM | POA: Diagnosis not present

## 2021-07-14 DIAGNOSIS — Z6834 Body mass index (BMI) 34.0-34.9, adult: Secondary | ICD-10-CM | POA: Diagnosis not present

## 2021-07-14 DIAGNOSIS — M7989 Other specified soft tissue disorders: Secondary | ICD-10-CM | POA: Diagnosis not present

## 2021-07-14 DIAGNOSIS — R69 Illness, unspecified: Secondary | ICD-10-CM | POA: Diagnosis not present

## 2021-07-19 DIAGNOSIS — M961 Postlaminectomy syndrome, not elsewhere classified: Secondary | ICD-10-CM | POA: Diagnosis not present

## 2021-07-19 DIAGNOSIS — M47817 Spondylosis without myelopathy or radiculopathy, lumbosacral region: Secondary | ICD-10-CM | POA: Diagnosis not present

## 2021-07-19 DIAGNOSIS — M47812 Spondylosis without myelopathy or radiculopathy, cervical region: Secondary | ICD-10-CM | POA: Diagnosis not present

## 2021-07-19 DIAGNOSIS — G894 Chronic pain syndrome: Secondary | ICD-10-CM | POA: Diagnosis not present

## 2021-08-08 DIAGNOSIS — I10 Essential (primary) hypertension: Secondary | ICD-10-CM | POA: Diagnosis not present

## 2021-08-10 DIAGNOSIS — I951 Orthostatic hypotension: Secondary | ICD-10-CM | POA: Diagnosis not present

## 2021-08-10 DIAGNOSIS — Z6834 Body mass index (BMI) 34.0-34.9, adult: Secondary | ICD-10-CM | POA: Diagnosis not present

## 2021-08-10 DIAGNOSIS — I10 Essential (primary) hypertension: Secondary | ICD-10-CM | POA: Diagnosis not present

## 2021-08-17 DIAGNOSIS — G4733 Obstructive sleep apnea (adult) (pediatric): Secondary | ICD-10-CM | POA: Diagnosis not present

## 2021-08-18 DIAGNOSIS — M47812 Spondylosis without myelopathy or radiculopathy, cervical region: Secondary | ICD-10-CM | POA: Diagnosis not present

## 2021-08-18 DIAGNOSIS — M47817 Spondylosis without myelopathy or radiculopathy, lumbosacral region: Secondary | ICD-10-CM | POA: Diagnosis not present

## 2021-08-18 DIAGNOSIS — M961 Postlaminectomy syndrome, not elsewhere classified: Secondary | ICD-10-CM | POA: Diagnosis not present

## 2021-08-18 DIAGNOSIS — G894 Chronic pain syndrome: Secondary | ICD-10-CM | POA: Diagnosis not present

## 2021-09-12 DIAGNOSIS — R6883 Chills (without fever): Secondary | ICD-10-CM | POA: Diagnosis not present

## 2021-09-12 DIAGNOSIS — Z6835 Body mass index (BMI) 35.0-35.9, adult: Secondary | ICD-10-CM | POA: Diagnosis not present

## 2021-09-19 DIAGNOSIS — G894 Chronic pain syndrome: Secondary | ICD-10-CM | POA: Diagnosis not present

## 2021-09-19 DIAGNOSIS — M47812 Spondylosis without myelopathy or radiculopathy, cervical region: Secondary | ICD-10-CM | POA: Diagnosis not present

## 2021-09-19 DIAGNOSIS — M47817 Spondylosis without myelopathy or radiculopathy, lumbosacral region: Secondary | ICD-10-CM | POA: Diagnosis not present

## 2021-09-19 DIAGNOSIS — M961 Postlaminectomy syndrome, not elsewhere classified: Secondary | ICD-10-CM | POA: Diagnosis not present

## 2021-10-19 DIAGNOSIS — M47812 Spondylosis without myelopathy or radiculopathy, cervical region: Secondary | ICD-10-CM | POA: Diagnosis not present

## 2021-10-19 DIAGNOSIS — M961 Postlaminectomy syndrome, not elsewhere classified: Secondary | ICD-10-CM | POA: Diagnosis not present

## 2021-10-19 DIAGNOSIS — M47817 Spondylosis without myelopathy or radiculopathy, lumbosacral region: Secondary | ICD-10-CM | POA: Diagnosis not present

## 2021-10-19 DIAGNOSIS — G894 Chronic pain syndrome: Secondary | ICD-10-CM | POA: Diagnosis not present

## 2021-11-08 DIAGNOSIS — M47817 Spondylosis without myelopathy or radiculopathy, lumbosacral region: Secondary | ICD-10-CM | POA: Diagnosis not present

## 2021-11-08 DIAGNOSIS — G894 Chronic pain syndrome: Secondary | ICD-10-CM | POA: Diagnosis not present

## 2021-11-08 DIAGNOSIS — M47812 Spondylosis without myelopathy or radiculopathy, cervical region: Secondary | ICD-10-CM | POA: Diagnosis not present

## 2021-11-08 DIAGNOSIS — M961 Postlaminectomy syndrome, not elsewhere classified: Secondary | ICD-10-CM | POA: Diagnosis not present

## 2021-11-17 DIAGNOSIS — G4733 Obstructive sleep apnea (adult) (pediatric): Secondary | ICD-10-CM | POA: Diagnosis not present

## 2021-12-19 DIAGNOSIS — M47812 Spondylosis without myelopathy or radiculopathy, cervical region: Secondary | ICD-10-CM | POA: Diagnosis not present

## 2021-12-19 DIAGNOSIS — G894 Chronic pain syndrome: Secondary | ICD-10-CM | POA: Diagnosis not present

## 2021-12-19 DIAGNOSIS — M961 Postlaminectomy syndrome, not elsewhere classified: Secondary | ICD-10-CM | POA: Diagnosis not present

## 2021-12-19 DIAGNOSIS — M47817 Spondylosis without myelopathy or radiculopathy, lumbosacral region: Secondary | ICD-10-CM | POA: Diagnosis not present

## 2021-12-19 DIAGNOSIS — Z79891 Long term (current) use of opiate analgesic: Secondary | ICD-10-CM | POA: Diagnosis not present

## 2021-12-20 ENCOUNTER — Other Ambulatory Visit (HOSPITAL_COMMUNITY): Payer: Self-pay

## 2021-12-20 MED ORDER — OXYCODONE HCL 10 MG PO TABS
ORAL_TABLET | ORAL | 0 refills | Status: AC
  Filled 2021-12-20: qty 180, 30d supply, fill #0

## 2022-01-12 DIAGNOSIS — G8929 Other chronic pain: Secondary | ICD-10-CM | POA: Diagnosis not present

## 2022-01-12 DIAGNOSIS — T85192A Other mechanical complication of implanted electronic neurostimulator (electrode) of spinal cord, initial encounter: Secondary | ICD-10-CM | POA: Diagnosis not present

## 2022-01-12 DIAGNOSIS — Z6836 Body mass index (BMI) 36.0-36.9, adult: Secondary | ICD-10-CM | POA: Diagnosis not present

## 2022-01-19 DIAGNOSIS — M47812 Spondylosis without myelopathy or radiculopathy, cervical region: Secondary | ICD-10-CM | POA: Diagnosis not present

## 2022-01-19 DIAGNOSIS — G894 Chronic pain syndrome: Secondary | ICD-10-CM | POA: Diagnosis not present

## 2022-01-19 DIAGNOSIS — M961 Postlaminectomy syndrome, not elsewhere classified: Secondary | ICD-10-CM | POA: Diagnosis not present

## 2022-01-19 DIAGNOSIS — M47817 Spondylosis without myelopathy or radiculopathy, lumbosacral region: Secondary | ICD-10-CM | POA: Diagnosis not present

## 2022-01-27 DIAGNOSIS — R059 Cough, unspecified: Secondary | ICD-10-CM | POA: Diagnosis not present

## 2022-01-27 DIAGNOSIS — R300 Dysuria: Secondary | ICD-10-CM | POA: Diagnosis not present

## 2022-01-27 DIAGNOSIS — R609 Edema, unspecified: Secondary | ICD-10-CM | POA: Diagnosis not present

## 2022-01-27 DIAGNOSIS — M791 Myalgia, unspecified site: Secondary | ICD-10-CM | POA: Diagnosis not present

## 2022-01-27 DIAGNOSIS — R5383 Other fatigue: Secondary | ICD-10-CM | POA: Diagnosis not present

## 2022-01-27 DIAGNOSIS — R5382 Chronic fatigue, unspecified: Secondary | ICD-10-CM | POA: Diagnosis not present

## 2022-02-04 DIAGNOSIS — R509 Fever, unspecified: Secondary | ICD-10-CM | POA: Diagnosis not present

## 2022-02-04 DIAGNOSIS — R1110 Vomiting, unspecified: Secondary | ICD-10-CM | POA: Diagnosis not present

## 2022-02-04 DIAGNOSIS — R059 Cough, unspecified: Secondary | ICD-10-CM | POA: Diagnosis not present

## 2022-02-04 DIAGNOSIS — R0981 Nasal congestion: Secondary | ICD-10-CM | POA: Diagnosis not present

## 2022-02-16 DIAGNOSIS — G894 Chronic pain syndrome: Secondary | ICD-10-CM | POA: Diagnosis not present

## 2022-02-16 DIAGNOSIS — M961 Postlaminectomy syndrome, not elsewhere classified: Secondary | ICD-10-CM | POA: Diagnosis not present

## 2022-02-16 DIAGNOSIS — M47817 Spondylosis without myelopathy or radiculopathy, lumbosacral region: Secondary | ICD-10-CM | POA: Diagnosis not present

## 2022-02-16 DIAGNOSIS — M47812 Spondylosis without myelopathy or radiculopathy, cervical region: Secondary | ICD-10-CM | POA: Diagnosis not present

## 2022-02-20 DIAGNOSIS — M791 Myalgia, unspecified site: Secondary | ICD-10-CM | POA: Diagnosis not present

## 2022-02-20 DIAGNOSIS — R0981 Nasal congestion: Secondary | ICD-10-CM | POA: Diagnosis not present

## 2022-02-20 DIAGNOSIS — R059 Cough, unspecified: Secondary | ICD-10-CM | POA: Diagnosis not present

## 2022-02-21 DIAGNOSIS — G4733 Obstructive sleep apnea (adult) (pediatric): Secondary | ICD-10-CM | POA: Diagnosis not present

## 2022-03-06 ENCOUNTER — Other Ambulatory Visit: Payer: Self-pay | Admitting: Neurological Surgery

## 2022-03-09 ENCOUNTER — Encounter (HOSPITAL_COMMUNITY): Payer: Self-pay | Admitting: Neurological Surgery

## 2022-03-09 ENCOUNTER — Other Ambulatory Visit: Payer: Self-pay

## 2022-03-09 NOTE — Progress Notes (Signed)
PCP - Bertram Millard, MD Cardiologist - denies  PPM/ICD - denies  Chest x-ray - n/a EKG - DOS - 03/13/22 Stress Test - 12/20/17 ECHO - 02/04/18 Cardiac Cath - denies  CPAP - yes  Fasting Blood Sugar - n/a  Blood Thinner Instructions: n/a Aspirin Instructions: Patient was instructed: As of today, STOP taking any Aspirin (unless otherwise instructed by your surgeon) Aleve, Naproxen, Ibuprofen, Motrin, Advil, Goody's, BC's, all herbal medications, fish oil, and all vitamins.  ERAS Protcol - yes, until 08:00 o'clock  COVID TEST- n/a  Anesthesia review: yes - history of stroke  Patient verbally denies any shortness of breath, fever, cough and chest pain during phone call   -------------  SDW INSTRUCTIONS given:  Your procedure is scheduled on Monday, October 30th, 2023.   Report to [facility] Main Entrance "A" at 08:30 A.M., and check in at the Admitting office.  Call this number if you have problems the morning of surgery:  [phone]   Remember:  Do not eat after midnight the night before your surgery  You may drink clear liquids until 08:00 the morning of your surgery.   Clear liquids allowed are: Water, Non-Citrus Juices (without pulp), Carbonated Beverages, Clear Tea, Black Coffee Only, and Gatorade    Take these medicines the morning of surgery with A SIP OF WATER Amlodipine, Crestor, Flomax PRN: Oxycodone   The day of surgery:                     Do not wear jewelry,             Do not wear lotions, powders, colognes, or deodorant.            Men may shave face and neck.            Do not bring valuables to the hospital.            [facility] is not responsible for any belongings or valuables.  Do NOT Smoke (Tobacco/Vaping) 24 hours prior to your procedure If you use a CPAP at night, you may bring all equipment for your overnight stay.   Contacts, glasses, dentures or bridgework may not be worn into surgery.      For patients admitted to the hospital,  discharge time will be determined by your treatment team.   Patients discharged the day of surgery will not be allowed to drive home, and someone needs to stay with them for 24 hours.    Special instructions:   [place]- Preparing For Surgery  Before surgery, you can play an important role. Because skin is not sterile, your skin needs to be as free of germs as possible. You can reduce the number of germs on your skin by washing with CHG (chlorahexidine gluconate) Soap before surgery.  CHG is an antiseptic cleaner which kills germs and bonds with the skin to continue killing germs even after washing.    Oral Hygiene is also important to reduce your risk of infection.  Remember - BRUSH YOUR TEETH THE MORNING OF SURGERY WITH YOUR REGULAR TOOTHPASTE  Please do not use if you have an allergy to CHG or antibacterial soaps. If your skin becomes reddened/irritated stop using the CHG.  Do not shave (including legs and underarms) for at least 48 hours prior to first CHG shower. It is OK to shave your face.  Please follow these instructions carefully.   Shower the NIGHT BEFORE SURGERY and the MORNING OF SURGERY with DIAL  Soap.   Pat yourself dry with a CLEAN TOWEL.  Wear CLEAN PAJAMAS to bed the night before surgery  Place CLEAN SHEETS on your bed the night of your first shower and DO NOT SLEEP WITH PETS.   Day of Surgery: Please shower morning of surgery  Wear Clean/Comfortable clothing the morning of surgery Do not apply any deodorants/lotions.   Remember to brush your teeth WITH YOUR REGULAR TOOTHPASTE.   Questions were answered. Patient verbalized understanding of instructions.

## 2022-03-10 NOTE — Progress Notes (Signed)
Anesthesia Chart Review: Same-day work-up  Patient with history of double vision concerning for possible TIA.  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  He was put on low-dose aspirin at that time.  He had recurrence of double vision in February 2022 that lasted about 2 weeks.  His PCP added Plavix to his aspirin and referred for neurology evaluation.  He was seen by Dr. Jannifer Franklin on 07/27/2020 and CTA of the head and neck were ordered (unable to have MRI due to presence of spinal cord stimulator).  He was also recommended to stop Plavix after 3 weeks of therapy.  CTA did not show any severe blockages, no source of TIA/double vision was noted.  OSA on CPAP.  Patient will need day of surgery labs and evaluation.  CTA head and neck 08/13/20: IMPRESSION: 1. Stable appearance of moderate right and mild left P2 segment stenosis. 2. No other significant proximal stenosis, aneurysm, or branch vessel occlusion within the Circle of Willis. 3. Atherosclerotic changes at the carotid bifurcations bilaterally and cavernous internal carotid arteries bilaterally without significant stenosis relative to the more distal vessels. 4. Stable remote basal ganglia infarct. 5. No acute intracranial abnormality. 6. Left temporal fossa arachnoid cyst is stable. 7. Solid anterior fusion at C6-7. 8. Stable degenerative changes of the cervical spine. 9. Aortic Atherosclerosis (ICD10-I70.0).   TTE 02/04/2018: Study Conclusions   - Left ventricle: The cavity size was normal. Wall thickness was    normal. Systolic function was normal. The estimated ejection    fraction was in the range of 60% to 65%. Wall motion was normal;    there were no regional wall motion abnormalities. Doppler    parameters are consistent with abnormal left ventricular    relaxation (grade 1 diastolic dysfunction).   Impressions:    - 1. Normal systolic function. Impaired relaxation.    2. Mild MR.   Nuclear stress 11/07/2012: IMPRESSION:  Normal study demonstrating no evidence of inducible myocardial  ischemia.  Left ventricular function is normal with quantitative  ejection fraction calculation of 67%.     Ronald [name] [facility] Short Stay Center/Anesthesiology Phone [phone] 03/10/2022 9:42 AM

## 2022-03-10 NOTE — Anesthesia Preprocedure Evaluation (Signed)
Anesthesia Evaluation  Patient identified by MRN, date of birth, ID band Patient awake    Reviewed: Allergy & Precautions, NPO status   Airway Mallampati: II       Dental   Pulmonary sleep apnea , pneumonia   breath sounds clear to auscultation       Cardiovascular hypertension,  Rhythm:Regular Rate:Normal     Neuro/Psych    GI/Hepatic negative GI ROS, Neg liver ROS,,,  Endo/Other    Renal/GU Renal disease     Musculoskeletal   Abdominal   Peds  Hematology   Anesthesia Other Findings   Reproductive/Obstetrics                             Anesthesia Physical Anesthesia Plan  ASA: 3  Anesthesia Plan: General   Post-op Pain Management: Tylenol PO (pre-op)*   Induction: Intravenous  PONV Risk Score and Plan: 2 and Ondansetron, Dexamethasone and Midazolam  Airway Management Planned: Oral ETT  Additional Equipment:   Intra-op Plan:   Post-operative Plan: Extubation in OR  Informed Consent: I have reviewed the patients History and Physical, chart, labs and discussed the procedure including the risks, benefits and alternatives for the proposed anesthesia with the patient or authorized representative who has indicated his/her understanding and acceptance.     Dental advisory given  Plan Discussed with: CRNA and Anesthesiologist  Anesthesia Plan Comments: (PAT note by Karoline Caldwell, PA-C: Patient with history of double vision concerning for possible TIA.  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  He was put on low-dose aspirin at that time.  He had recurrence of double vision in February 2022 that lasted about 2 weeks.  His PCP added Plavix to his aspirin and referred for neurology evaluation.  He was seen by Dr. Jannifer Franklin on 07/27/2020 and CTA of the head and neck were ordered  (unable to have MRI due to presence of spinal cord stimulator).  He was also recommended to stop Plavix after 3 weeks of therapy.  CTA did not show any severe blockages, no source of TIA/double vision was noted.  OSA on CPAP.  Patient will need day of surgery labs and evaluation.  CTA head and neck 08/13/20: IMPRESSION: 1. Stable appearance of moderate right and mild left P2 segment stenosis. 2. No other significant proximal stenosis, aneurysm, or branch vessel occlusion within the Circle of Willis. 3. Atherosclerotic changes at the carotid bifurcations bilaterally and cavernous internal carotid arteries bilaterally without significant stenosis relative to the more distal vessels. 4. Stable remote basal ganglia infarct. 5. No acute intracranial abnormality. 6. Left temporal fossa arachnoid cyst is stable. 7. Solid anterior fusion at C6-7. 8. Stable degenerative changes of the cervical spine. 9. Aortic Atherosclerosis (ICD10-I70.0).   TTE 02/04/2018: Study Conclusions   - Left ventricle: The cavity size was normal. Wall thickness was  normal. Systolic function was normal. The estimated ejection  fraction was in the range of 60% to 65%. Wall motion was normal;  there were no regional wall motion abnormalities. Doppler  parameters are consistent with abnormal left ventricular  relaxation (grade 1 diastolic dysfunction).   Impressions:   - 1. Normal systolic function. Impaired relaxation.  2. Mild MR.   Nuclear stress 11/07/2012: IMPRESSION:  Normal study demonstrating no evidence of inducible myocardial  ischemia. Left ventricular function is normal with quantitative  ejection fraction calculation of 67%.  )        Anesthesia Quick Evaluation

## 2022-03-13 ENCOUNTER — Ambulatory Visit (HOSPITAL_COMMUNITY): Payer: Medicare HMO | Admitting: Physician Assistant

## 2022-03-13 ENCOUNTER — Ambulatory Visit (HOSPITAL_COMMUNITY)
Admission: RE | Admit: 2022-03-13 | Discharge: 2022-03-13 | Disposition: A | Discharge status: Home / Self Care | Payer: Medicare HMO | Attending: Neurological Surgery | Admitting: Neurological Surgery

## 2022-03-13 ENCOUNTER — Ambulatory Visit (HOSPITAL_COMMUNITY): Payer: Medicare HMO

## 2022-03-13 ENCOUNTER — Ambulatory Visit (HOSPITAL_BASED_OUTPATIENT_CLINIC_OR_DEPARTMENT_OTHER): Payer: Medicare HMO | Admitting: Physician Assistant

## 2022-03-13 ENCOUNTER — Encounter (HOSPITAL_COMMUNITY): Payer: Self-pay | Admitting: Neurological Surgery

## 2022-03-13 ENCOUNTER — Other Ambulatory Visit: Payer: Self-pay

## 2022-03-13 ENCOUNTER — Encounter (HOSPITAL_COMMUNITY)
Admission: RE | Disposition: A | Discharge status: Home / Self Care | Payer: Self-pay | Source: Home / Self Care | Attending: Neurological Surgery

## 2022-03-13 DIAGNOSIS — Z4542 Encounter for adjustment and management of neuropacemaker (brain) (peripheral nerve) (spinal cord): Secondary | ICD-10-CM | POA: Diagnosis not present

## 2022-03-13 DIAGNOSIS — J189 Pneumonia, unspecified organism: Secondary | ICD-10-CM | POA: Diagnosis not present

## 2022-03-13 DIAGNOSIS — I10 Essential (primary) hypertension: Secondary | ICD-10-CM | POA: Diagnosis not present

## 2022-03-13 DIAGNOSIS — G4730 Sleep apnea, unspecified: Secondary | ICD-10-CM

## 2022-03-13 DIAGNOSIS — T85112A Breakdown (mechanical) of implanted electronic neurostimulator (electrode) of spinal cord, initial encounter: Secondary | ICD-10-CM | POA: Diagnosis not present

## 2022-03-13 DIAGNOSIS — I129 Hypertensive chronic kidney disease with stage 1 through stage 4 chronic kidney disease, or unspecified chronic kidney disease: Secondary | ICD-10-CM | POA: Insufficient documentation

## 2022-03-13 DIAGNOSIS — T85113A Breakdown (mechanical) of implanted electronic neurostimulator, generator, initial encounter: Secondary | ICD-10-CM | POA: Diagnosis not present

## 2022-03-13 HISTORY — PX: SPINAL CORD STIMULATOR REMOVAL: SHX5379

## 2022-03-13 LAB — SURGICAL PCR SCREEN
MRSA, PCR: NEGATIVE
Staphylococcus aureus: NEGATIVE

## 2022-03-13 LAB — CBC
HCT: 33.9 % — ABNORMAL LOW (ref 39.0–52.0)
Hemoglobin: 11.2 g/dL — ABNORMAL LOW (ref 13.0–17.0)
MCH: 32.7 pg (ref 26.0–34.0)
MCHC: 33 g/dL (ref 30.0–36.0)
MCV: 99.1 fL (ref 80.0–100.0)
Platelets: 187 10*3/uL (ref 150–400)
RBC: 3.42 MIL/uL — ABNORMAL LOW (ref 4.22–5.81)
RDW: 13.3 % (ref 11.5–15.5)
WBC: 4.9 10*3/uL (ref 4.0–10.5)
nRBC: 0 % (ref 0.0–0.2)

## 2022-03-13 LAB — BASIC METABOLIC PANEL
Anion gap: 8 (ref 5–15)
BUN: 15 mg/dL (ref 8–23)
CO2: 26 mmol/L (ref 22–32)
Calcium: 9.3 mg/dL (ref 8.9–10.3)
Chloride: 105 mmol/L (ref 98–111)
Creatinine, Ser: 1.41 mg/dL — ABNORMAL HIGH (ref 0.61–1.24)
GFR, Estimated: 49 mL/min — ABNORMAL LOW (ref >60)
Glucose, Bld: 101 mg/dL — ABNORMAL HIGH (ref 70–99)
Potassium: 3.8 mmol/L (ref 3.5–5.1)
Sodium: 139 mmol/L (ref 135–145)

## 2022-03-13 SURGERY — LUMBAR SPINAL CORD STIMULATOR REMOVAL
Anesthesia: General | Site: Back

## 2022-03-13 MED ORDER — SENNA 8.6 MG PO TABS: 1.0000 | ORAL_TABLET | Freq: Two times a day (BID) | ORAL | Status: DC

## 2022-03-13 MED ORDER — MORPHINE SULFATE (PF) 2 MG/ML IV SOLN: 2.0000 mg | INTRAVENOUS | Status: DC | PRN

## 2022-03-13 MED ORDER — BUPIVACAINE HCL (PF) 0.5 % IJ SOLN
INTRAMUSCULAR | Status: DC | PRN
  Administered 2022-03-13: 30 mL

## 2022-03-13 MED ORDER — CHLORHEXIDINE GLUCONATE CLOTH 2 % EX PADS: 6.0000 | MEDICATED_PAD | Freq: Once | CUTANEOUS | Status: DC

## 2022-03-13 MED ORDER — LACTATED RINGERS IV SOLN: INTRAVENOUS | Status: DC

## 2022-03-13 MED ORDER — DEXAMETHASONE SODIUM PHOSPHATE 10 MG/ML IJ SOLN
INTRAMUSCULAR | Status: DC | PRN
  Administered 2022-03-13: 5 mg via INTRAVENOUS

## 2022-03-13 MED ORDER — PHENYLEPHRINE 80 MCG/ML (10ML) SYRINGE FOR IV PUSH (FOR BLOOD PRESSURE SUPPORT)
PREFILLED_SYRINGE | INTRAVENOUS | Status: DC | PRN
  Administered 2022-03-13 (×4): 160 ug via INTRAVENOUS
  Administered 2022-03-13: 80 ug via INTRAVENOUS
  Administered 2022-03-13: 160 ug via INTRAVENOUS
  Administered 2022-03-13 (×2): 80 ug via INTRAVENOUS

## 2022-03-13 MED ORDER — MENTHOL 3 MG MT LOZG: 1.0000 | LOZENGE | OROMUCOSAL | Status: DC | PRN

## 2022-03-13 MED ORDER — CHLORHEXIDINE GLUCONATE 0.12 % MT SOLN
15.0000 mL | Freq: Once | OROMUCOSAL | Status: AC
  Administered 2022-03-13: 15 mL via OROMUCOSAL
  Filled 2022-03-13: qty 15

## 2022-03-13 MED ORDER — SUCCINYLCHOLINE CHLORIDE 200 MG/10ML IV SOSY
PREFILLED_SYRINGE | INTRAVENOUS | Status: DC | PRN
  Administered 2022-03-13: 120 mg via INTRAVENOUS

## 2022-03-13 MED ORDER — PHENOL 1.4 % MT LIQD: 1.0000 | OROMUCOSAL | Status: DC | PRN

## 2022-03-13 MED ORDER — FENTANYL CITRATE (PF) 250 MCG/5ML IJ SOLN
INTRAMUSCULAR | Status: AC
  Filled 2022-03-13: qty 5

## 2022-03-13 MED ORDER — BUPIVACAINE HCL (PF) 0.5 % IJ SOLN
INTRAMUSCULAR | Status: AC
  Filled 2022-03-13: qty 30

## 2022-03-13 MED ORDER — ACETAMINOPHEN 325 MG PO TABS: 650.0000 mg | ORAL_TABLET | ORAL | Status: DC | PRN

## 2022-03-13 MED ORDER — METHOCARBAMOL 500 MG PO TABS: 500.0000 mg | ORAL_TABLET | Freq: Four times a day (QID) | ORAL | Status: DC | PRN

## 2022-03-13 MED ORDER — CEFAZOLIN SODIUM-DEXTROSE 2-4 GM/100ML-% IV SOLN
2.0000 g | INTRAVENOUS | Status: AC
  Administered 2022-03-13: 2 g via INTRAVENOUS
  Filled 2022-03-13: qty 100

## 2022-03-13 MED ORDER — LIDOCAINE-EPINEPHRINE 1 %-1:100000 IJ SOLN
INTRAMUSCULAR | Status: AC
  Filled 2022-03-13: qty 1

## 2022-03-13 MED ORDER — ONDANSETRON HCL 4 MG/2ML IJ SOLN
INTRAMUSCULAR | Status: AC
  Filled 2022-03-13: qty 2

## 2022-03-13 MED ORDER — SUCCINYLCHOLINE CHLORIDE 200 MG/10ML IV SOSY
PREFILLED_SYRINGE | INTRAVENOUS | Status: AC
  Filled 2022-03-13: qty 10

## 2022-03-13 MED ORDER — PROPOFOL 10 MG/ML IV BOLUS
INTRAVENOUS | Status: DC | PRN
  Administered 2022-03-13 (×2): 50 mg via INTRAVENOUS
  Administered 2022-03-13: 100 mg via INTRAVENOUS

## 2022-03-13 MED ORDER — ONDANSETRON HCL 4 MG PO TABS: 4.0000 mg | ORAL_TABLET | Freq: Four times a day (QID) | ORAL | Status: DC | PRN

## 2022-03-13 MED ORDER — ONDANSETRON HCL 4 MG/2ML IJ SOLN: 4.0000 mg | Freq: Four times a day (QID) | INTRAMUSCULAR | Status: DC | PRN

## 2022-03-13 MED ORDER — METHOCARBAMOL 1000 MG/10ML IJ SOLN: 500.0000 mg | Freq: Four times a day (QID) | INTRAVENOUS | Status: DC | PRN

## 2022-03-13 MED ORDER — ORAL CARE MOUTH RINSE: 15.0000 mL | Freq: Once | OROMUCOSAL | Status: AC

## 2022-03-13 MED ORDER — ALUM & MAG HYDROXIDE-SIMETH 200-200-20 MG/5ML PO SUSP: 30.0000 mL | Freq: Four times a day (QID) | ORAL | Status: DC | PRN

## 2022-03-13 MED ORDER — EPHEDRINE SULFATE-NACL 50-0.9 MG/10ML-% IV SOSY
PREFILLED_SYRINGE | INTRAVENOUS | Status: DC | PRN
  Administered 2022-03-13 (×2): 5 mg via INTRAVENOUS

## 2022-03-13 MED ORDER — LIDOCAINE 2% (20 MG/ML) 5 ML SYRINGE
INTRAMUSCULAR | Status: DC | PRN
  Administered 2022-03-13: 100 mg via INTRAVENOUS

## 2022-03-13 MED ORDER — PHENYLEPHRINE 80 MCG/ML (10ML) SYRINGE FOR IV PUSH (FOR BLOOD PRESSURE SUPPORT)
PREFILLED_SYRINGE | INTRAVENOUS | Status: AC
  Filled 2022-03-13: qty 10

## 2022-03-13 MED ORDER — ONDANSETRON HCL 4 MG/2ML IJ SOLN
INTRAMUSCULAR | Status: DC | PRN
  Administered 2022-03-13: 4 mg via INTRAVENOUS

## 2022-03-13 MED ORDER — 0.9 % SODIUM CHLORIDE (POUR BTL) OPTIME
TOPICAL | Status: DC | PRN
  Administered 2022-03-13: 1000 mL

## 2022-03-13 MED ORDER — SODIUM CHLORIDE 0.9 % IV SOLN: 250.0000 mL | INTRAVENOUS | Status: DC

## 2022-03-13 MED ORDER — DEXAMETHASONE SODIUM PHOSPHATE 10 MG/ML IJ SOLN
INTRAMUSCULAR | Status: AC
  Filled 2022-03-13: qty 1

## 2022-03-13 MED ORDER — LIDOCAINE 2% (20 MG/ML) 5 ML SYRINGE
INTRAMUSCULAR | Status: AC
  Filled 2022-03-13: qty 5

## 2022-03-13 MED ORDER — PROPOFOL 10 MG/ML IV BOLUS
INTRAVENOUS | Status: AC
  Filled 2022-03-13: qty 20

## 2022-03-13 MED ORDER — ACETAMINOPHEN 650 MG RE SUPP: 650.0000 mg | RECTAL | Status: DC | PRN

## 2022-03-13 MED ORDER — SODIUM CHLORIDE 0.9% FLUSH: 3.0000 mL | INTRAVENOUS | Status: DC | PRN

## 2022-03-13 MED ORDER — LIDOCAINE-EPINEPHRINE 1 %-1:100000 IJ SOLN
INTRAMUSCULAR | Status: DC | PRN
  Administered 2022-03-13: 10 mL

## 2022-03-13 MED ORDER — BISACODYL 10 MG RE SUPP: 10.0000 mg | Freq: Every day | RECTAL | Status: DC | PRN

## 2022-03-13 MED ORDER — POLYETHYLENE GLYCOL 3350 17 G PO PACK: 17.0000 g | PACK | Freq: Every day | ORAL | Status: DC | PRN

## 2022-03-13 MED ORDER — FLEET ENEMA 7-19 GM/118ML RE ENEM: 1.0000 | ENEMA | Freq: Once | RECTAL | Status: DC | PRN

## 2022-03-13 MED ORDER — FENTANYL CITRATE (PF) 250 MCG/5ML IJ SOLN
INTRAMUSCULAR | Status: DC | PRN
  Administered 2022-03-13: 25 ug via INTRAVENOUS
  Administered 2022-03-13: 50 ug via INTRAVENOUS
  Administered 2022-03-13: 25 ug via INTRAVENOUS

## 2022-03-13 MED ORDER — DOCUSATE SODIUM 100 MG PO CAPS: 100.0000 mg | ORAL_CAPSULE | Freq: Two times a day (BID) | ORAL | Status: DC

## 2022-03-13 SURGICAL SUPPLY — 39 items
APL SKNCLS STERI-STRIP NONHPOA (GAUZE/BANDAGES/DRESSINGS)
BAG COUNTER SPONGE SURGICOUNT (BAG) ×2 IMPLANT
BAG SPNG CNTER NS LX DISP (BAG) ×2
BENZOIN TINCTURE PRP APPL 2/3 (GAUZE/BANDAGES/DRESSINGS) IMPLANT
BLADE CLIPPER SURG (BLADE) IMPLANT
DRAPE LAPAROTOMY 100X72X124 (DRAPES) ×2 IMPLANT
DURAPREP 26ML APPLICATOR (WOUND CARE) ×2 IMPLANT
ELECT REM PT RETURN 9FT ADLT (ELECTROSURGICAL) ×2
ELECTRODE REM PT RTRN 9FT ADLT (ELECTROSURGICAL) ×2 IMPLANT
GAUZE 4X4 16PLY ~~LOC~~+RFID DBL (SPONGE) IMPLANT
GLOVE BIOGEL PI IND STRL 8.5 (GLOVE) ×2 IMPLANT
GLOVE ECLIPSE 8.5 STRL (GLOVE) ×2 IMPLANT
GLOVE EXAM NITRILE XL STR (GLOVE) IMPLANT
GOWN STRL REUS W/ TWL LRG LVL3 (GOWN DISPOSABLE) IMPLANT
GOWN STRL REUS W/ TWL XL LVL3 (GOWN DISPOSABLE) ×2 IMPLANT
GOWN STRL REUS W/TWL 2XL LVL3 (GOWN DISPOSABLE) ×2 IMPLANT
GOWN STRL REUS W/TWL LRG LVL3 (GOWN DISPOSABLE)
GOWN STRL REUS W/TWL XL LVL3 (GOWN DISPOSABLE) ×2
KIT BASIN OR (CUSTOM PROCEDURE TRAY) ×2 IMPLANT
KIT TURNOVER KIT B (KITS) ×2 IMPLANT
NEEDLE HYPO 22GX1.5 SAFETY (NEEDLE) ×2 IMPLANT
NS IRRIG 1000ML POUR BTL (IV SOLUTION) ×2 IMPLANT
PACK LAMINECTOMY NEURO (CUSTOM PROCEDURE TRAY) ×2 IMPLANT
PAD ARMBOARD 7.5X6 YLW CONV (MISCELLANEOUS) ×2 IMPLANT
SPIKE FLUID TRANSFER (MISCELLANEOUS) ×2 IMPLANT
SPONGE SURGIFOAM ABS GEL SZ50 (HEMOSTASIS) ×2 IMPLANT
SPONGE T-LAP 4X18 ~~LOC~~+RFID (SPONGE) IMPLANT
STAPLER SKIN PROX WIDE 3.9 (STAPLE) IMPLANT
STRIP CLOSURE SKIN 1/2X4 (GAUZE/BANDAGES/DRESSINGS) IMPLANT
SUT SILK 0 TIES 10X30 (SUTURE) IMPLANT
SUT VIC AB 0 CT1 18XCR BRD8 (SUTURE) ×2 IMPLANT
SUT VIC AB 0 CT1 8-18 (SUTURE) ×2
SUT VIC AB 2-0 CP2 18 (SUTURE) ×2 IMPLANT
SUT VIC AB 3-0 SH 8-18 (SUTURE) ×2 IMPLANT
SUT VIC AB 4-0 RB1 18 (SUTURE) ×2 IMPLANT
SYR CONTROL 10ML LL (SYRINGE) ×2 IMPLANT
TOWEL GREEN STERILE (TOWEL DISPOSABLE) ×2 IMPLANT
TOWEL GREEN STERILE FF (TOWEL DISPOSABLE) ×2 IMPLANT
WATER STERILE IRR 1000ML POUR (IV SOLUTION) ×2 IMPLANT

## 2022-03-13 NOTE — Anesthesia Procedure Notes (Signed)
Procedure Name: Intubation Date/Time: 03/13/2022 11:31 AM  Performed by: Erick Colace, CRNAPre-anesthesia Checklist: Patient identified, Emergency Drugs available, Suction available and Patient being monitored Patient Re-evaluated:Patient Re-evaluated prior to induction Oxygen Delivery Method: Circle system utilized Preoxygenation: Pre-oxygenation with 100% oxygen Induction Type: IV induction Ventilation: Mask ventilation without difficulty and Oral airway inserted - appropriate to patient size Laryngoscope Size: Mac and 4 Grade View: Grade II Tube type: Oral Number of attempts: 1 Airway Equipment and Method: Stylet and Oral airway Placement Confirmation: ETT inserted through vocal cords under direct vision, positive ETCO2 and breath sounds checked- equal and bilateral Secured at: 23 cm Tube secured with: Tape Dental Injury: Teeth and Oropharynx as per pre-operative assessment

## 2022-03-13 NOTE — Op Note (Signed)
Date of surgery: 03/13/2022 Preoperative diagnosis: Spinal cord stimulator generator end-of-life Postoperative diagnosis: Same and loss of paddle lead integrity Procedure: Removal of distal paddle electrodes and spinal cord stimulator generator Surgeon: Kristeen Miss Anesthesia: General endotracheal Indications: Ronald [name] is an 81 year old in the dividual who has had a spinal cord stimulator since 2007.  Over the last couple of years he notes that he is not able to charge the device and it was identified that it has reached its end-of-life.  He is advised regarding revision with final cord stimulator generator and this procedure was to do that.  At the time of exploration to remove the generator I noted that there was some dark metallic debris in the fascia underneath the spinal cord stimulator generator and the coil of wires that was the leads from the paddle electrode.  Further examination revealed metallic fibers to her individual wires of the leads that had breach through their insulation.  There is clear at this point that there was a lack of integrity of the paddle electrode.  The generator was then removed and the leads were removed with the proximal end being cut sharply.  The fractured wire and loose insulation that was in the area underneath the generator was then debrided tissue was sent to pathology along with the generator and the wires.  The area was then irrigated copiously with saline hemostasis was obtained meticulously and 20 cc of half percent Marcaine was injected into the surrounding area.  Once good hemostasis was established the deep fascia was closed with 2-0 Vicryl in interrupted fashion and 3-0 Vicryl was used in the subcuticular skin Dermabond was placed on the skin blood loss was estimated at less than 10 cc.

## 2022-03-13 NOTE — Transfer of Care (Signed)
Immediate Anesthesia Transfer of Care Note  Patient: Ronald [name]  Procedure(s) Performed: stimulator removal (Back)  Patient Location: PACU  Anesthesia Type:General  Level of Consciousness: drowsy  Airway & Oxygen Therapy: Patient Spontanous Breathing and Patient connected to face mask oxygen  Post-op Assessment: Report given to RN and Post -op Vital signs reviewed and stable  Post vital signs: Reviewed and stable  Last Vitals:  Vitals Value Taken Time  BP 164/66 03/13/22 1243  Temp    Pulse 81 03/13/22 1245  Resp 13 03/13/22 1245  SpO2 100 % 03/13/22 1245  Vitals shown include unvalidated device data.  Last Pain:  Vitals:   03/13/22 0921  TempSrc:   PainSc: 0-No pain         Complications: No notable events documented.

## 2022-03-13 NOTE — H&P (Signed)
Ronald [name] is an 81 y.o. male.   Chief Complaint: Expired spinal cord stimulator battery HPI: Ronald [name] is an 81 year old individual whose had a spinal cord stimulator for a number of years.  Lately he has noted that the relief has not been as good and the charge does not last as long but the stimulator still remains effective for him when it does work.  He has been advised that the stimulator battery has reached end-of-life and needs to be replaced.  He is now admitted to undergo that procedure.  Past Medical History:  Diagnosis Date   Allergic rhinitis    Anxiety    BPH (benign prostatic hyperplasia)    Chronic kidney disease    Chronic pain syndrome    Hypertension    Lumbar radicular pain    Major depressive disorder    Mixed hyperlipidemia    Neuropathy    Pneumonia    Sleep apnea    Stroke [facility])     Past Surgical History:  Procedure Laterality Date   BACK SURGERY     CERVICAL FUSION     colonoscopy     reported as normal   LUMBAR NERVE STIMLATOR INSERTION     THYROIDECTOMY      Family History  Problem Relation Age of Onset   Coronary artery disease Father 40       CABG   Hypertension Father    Diabetes Father    Social History:  reports that he has never smoked. He has never used smokeless tobacco. He reports that he does not drink alcohol and does not use drugs.  Allergies:  Allergies  Allergen Reactions   Phenobarbital Rash    Medications Prior to Admission  Medication Sig Dispense Refill   amLODipine (NORVASC) 5 MG tablet Take 5 mg by mouth daily.     DULoxetine (CYMBALTA) 60 MG capsule Take 60 mg by mouth every evening.     furosemide (LASIX) 40 MG tablet Take 40 mg by mouth daily.     Oxycodone HCl 10 MG TABS Take 1 tablet by mouth every 4 hours as needed for pain. 180 tablet 0   potassium chloride SA (KLOR-CON M) 20 MEQ tablet Take 20 mEq by mouth daily.     pramipexole (MIRAPEX) 0.25 MG tablet Take 0.25 mg by mouth at bedtime. RLS      rosuvastatin (CRESTOR) 5 MG tablet Take 5 mg by mouth every Monday, Wednesday, and Friday.     tamsulosin (FLOMAX) 0.4 MG CAPS Take 0.8 mg by mouth daily.     telmisartan (MICARDIS) 80 MG tablet Take 80 mg by mouth daily. for high blood pressure     triamcinolone cream (KENALOG) 0.1 % Apply 1 Application topically 2 (two) times daily as needed (irritation).      Results for orders placed or performed during the hospital encounter of 03/13/22 (from the past 48 hour(s))  CBC per protocol     Status: Abnormal   Collection Time: 03/13/22  9:39 AM  Result Value Ref Range   WBC 4.9 4.0 - 10.5 K/uL   RBC 3.42 (L) 4.22 - 5.81 MIL/uL   Hemoglobin 11.2 (L) 13.0 - 17.0 g/dL   HCT 33.9 (L) 39.0 - 52.0 %   MCV 99.1 80.0 - 100.0 fL   MCH 32.7 26.0 - 34.0 pg   MCHC 33.0 30.0 - 36.0 g/dL   RDW 13.3 11.5 - 15.5 %   Platelets 187 150 - 400 K/uL   nRBC 0.0  0.0 - 0.2 %    Comment: Performed at [facility], [place] [address], [place], [place] [address]  Basic metabolic panel     Status: Abnormal   Collection Time: 03/13/22 10:11 AM  Result Value Ref Range   Sodium 139 135 - 145 mmol/L   Potassium 3.8 3.5 - 5.1 mmol/L   Chloride 105 98 - 111 mmol/L   CO2 26 22 - 32 mmol/L   Glucose, Bld 101 (H) 70 - 99 mg/dL    Comment: Glucose reference range applies only to samples taken after fasting for at least 8 hours.   BUN 15 8 - 23 mg/dL   Creatinine, Ser 1.41 (H) 0.61 - 1.24 mg/dL   Calcium 9.3 8.9 - 10.3 mg/dL   GFR, Estimated 49 (L) >60 mL/min    Comment: (NOTE) Calculated using the CKD-EPI Creatinine Equation (2021)    Anion gap 8 5 - 15    Comment: Performed at [place] [address] [place],  [address]   No results found.  Review of Systems  Constitutional: Negative.   Musculoskeletal:  Positive for back pain.  All other systems reviewed and are negative.   Blood pressure (!) 181/74, pulse 66, temperature 97.7 F (36.5 C), temperature source Oral, resp. rate 18,  height 5' 6.5" (1.689 m), weight 93 kg, SpO2 94 %. Physical Exam Constitutional:      Appearance: Normal appearance. He is obese.  HENT:     Head: Normocephalic and atraumatic.     Nose: Nose normal.     Mouth/Throat:     Mouth: Mucous membranes are moist.     Pharynx: Oropharynx is clear.  Eyes:     Extraocular Movements: Extraocular movements intact.     Conjunctiva/sclera: Conjunctivae normal.     Pupils: Pupils are equal, round, and reactive to light.  Cardiovascular:     Rate and Rhythm: Normal rate and regular rhythm.     Pulses: Normal pulses.     Heart sounds: Normal heart sounds.  Pulmonary:     Effort: Pulmonary effort is normal.     Breath sounds: Normal breath sounds.  Abdominal:     General: Abdomen is flat. Bowel sounds are normal.     Palpations: Abdomen is soft.  Musculoskeletal:        General: Normal range of motion.     Cervical back: Normal range of motion and neck supple.  Skin:    General: Skin is warm and dry.     Capillary Refill: Capillary refill takes less than 2 seconds.  Neurological:     General: No focal deficit present.     Mental Status: He is alert and oriented to person, place, and time. Mental status is at baseline.  Psychiatric:        Mood and Affect: Mood normal.        Behavior: Behavior normal.        Thought Content: Thought content normal.        Judgment: Judgment normal.      Assessment/Plan Fired spinal cord stimulator battery.  Plan: Replace spinal cord stimulator battery and generator.  Earleen [place], MD 03/13/2022, 11:08 AM

## 2022-03-13 NOTE — Anesthesia Postprocedure Evaluation (Signed)
Anesthesia Post Note  Patient: Ronald [name]  Procedure(s) Performed: stimulator removal (Back)     Patient location during evaluation: PACU Anesthesia Type: General Level of consciousness: awake Pain management: pain level controlled Vital Signs Assessment: post-procedure vital signs reviewed and stable Respiratory status: spontaneous breathing Cardiovascular status: stable Postop Assessment: no apparent nausea or vomiting Anesthetic complications: no   No notable events documented.  Last Vitals:  Vitals:   03/13/22 1245 03/13/22 1315  BP: (!) 153/65 (!) 141/65  Pulse: 81 76  Resp: 13 14  Temp: 36.5 C 36.5 C  SpO2: 100% 93%    Last Pain:  Vitals:   03/13/22 1315  TempSrc:   PainSc: 0-No pain                 Johanan Skorupski

## 2022-03-14 ENCOUNTER — Encounter (HOSPITAL_COMMUNITY): Payer: Self-pay | Admitting: Neurological Surgery

## 2022-03-14 LAB — SURGICAL PATHOLOGY, GROSS ONLY (NOT ARMC)

## 2022-03-21 DIAGNOSIS — M47817 Spondylosis without myelopathy or radiculopathy, lumbosacral region: Secondary | ICD-10-CM | POA: Diagnosis not present

## 2022-03-21 DIAGNOSIS — G894 Chronic pain syndrome: Secondary | ICD-10-CM | POA: Diagnosis not present

## 2022-03-21 DIAGNOSIS — M961 Postlaminectomy syndrome, not elsewhere classified: Secondary | ICD-10-CM | POA: Diagnosis not present

## 2022-03-21 DIAGNOSIS — M47812 Spondylosis without myelopathy or radiculopathy, cervical region: Secondary | ICD-10-CM | POA: Diagnosis not present

## 2022-03-31 DIAGNOSIS — R21 Rash and other nonspecific skin eruption: Secondary | ICD-10-CM | POA: Diagnosis not present

## 2022-03-31 DIAGNOSIS — Z6834 Body mass index (BMI) 34.0-34.9, adult: Secondary | ICD-10-CM | POA: Diagnosis not present

## 2022-04-11 DIAGNOSIS — L814 Other melanin hyperpigmentation: Secondary | ICD-10-CM | POA: Diagnosis not present

## 2022-04-11 DIAGNOSIS — L578 Other skin changes due to chronic exposure to nonionizing radiation: Secondary | ICD-10-CM | POA: Diagnosis not present

## 2022-04-11 DIAGNOSIS — C44519 Basal cell carcinoma of skin of other part of trunk: Secondary | ICD-10-CM | POA: Diagnosis not present

## 2022-04-20 DIAGNOSIS — Z79891 Long term (current) use of opiate analgesic: Secondary | ICD-10-CM | POA: Diagnosis not present

## 2022-04-20 DIAGNOSIS — G894 Chronic pain syndrome: Secondary | ICD-10-CM | POA: Diagnosis not present

## 2022-04-20 DIAGNOSIS — M47812 Spondylosis without myelopathy or radiculopathy, cervical region: Secondary | ICD-10-CM | POA: Diagnosis not present

## 2022-04-20 DIAGNOSIS — M47817 Spondylosis without myelopathy or radiculopathy, lumbosacral region: Secondary | ICD-10-CM | POA: Diagnosis not present

## 2022-04-20 DIAGNOSIS — M961 Postlaminectomy syndrome, not elsewhere classified: Secondary | ICD-10-CM | POA: Diagnosis not present

## 2022-04-25 DIAGNOSIS — C44519 Basal cell carcinoma of skin of other part of trunk: Secondary | ICD-10-CM | POA: Diagnosis not present

## 2022-05-10 DIAGNOSIS — J111 Influenza due to unidentified influenza virus with other respiratory manifestations: Secondary | ICD-10-CM | POA: Diagnosis not present

## 2022-05-19 DIAGNOSIS — M961 Postlaminectomy syndrome, not elsewhere classified: Secondary | ICD-10-CM | POA: Diagnosis not present

## 2022-05-19 DIAGNOSIS — G894 Chronic pain syndrome: Secondary | ICD-10-CM | POA: Diagnosis not present

## 2022-05-19 DIAGNOSIS — M47817 Spondylosis without myelopathy or radiculopathy, lumbosacral region: Secondary | ICD-10-CM | POA: Diagnosis not present

## 2022-05-19 DIAGNOSIS — M47812 Spondylosis without myelopathy or radiculopathy, cervical region: Secondary | ICD-10-CM | POA: Diagnosis not present

## 2022-05-25 DIAGNOSIS — G4733 Obstructive sleep apnea (adult) (pediatric): Secondary | ICD-10-CM | POA: Diagnosis not present

## 2022-05-25 DIAGNOSIS — R251 Tremor, unspecified: Secondary | ICD-10-CM | POA: Diagnosis not present

## 2022-05-25 DIAGNOSIS — L309 Dermatitis, unspecified: Secondary | ICD-10-CM | POA: Diagnosis not present

## 2022-05-25 DIAGNOSIS — Z6834 Body mass index (BMI) 34.0-34.9, adult: Secondary | ICD-10-CM | POA: Diagnosis not present

## 2022-05-25 DIAGNOSIS — E782 Mixed hyperlipidemia: Secondary | ICD-10-CM | POA: Diagnosis not present

## 2022-05-25 DIAGNOSIS — D649 Anemia, unspecified: Secondary | ICD-10-CM | POA: Diagnosis not present

## 2022-05-25 DIAGNOSIS — Z79899 Other long term (current) drug therapy: Secondary | ICD-10-CM | POA: Diagnosis not present

## 2022-05-25 DIAGNOSIS — I10 Essential (primary) hypertension: Secondary | ICD-10-CM | POA: Diagnosis not present

## 2022-06-21 DIAGNOSIS — M961 Postlaminectomy syndrome, not elsewhere classified: Secondary | ICD-10-CM | POA: Diagnosis not present

## 2022-06-21 DIAGNOSIS — G894 Chronic pain syndrome: Secondary | ICD-10-CM | POA: Diagnosis not present

## 2022-06-21 DIAGNOSIS — M47817 Spondylosis without myelopathy or radiculopathy, lumbosacral region: Secondary | ICD-10-CM | POA: Diagnosis not present

## 2022-06-21 DIAGNOSIS — M47812 Spondylosis without myelopathy or radiculopathy, cervical region: Secondary | ICD-10-CM | POA: Diagnosis not present

## 2022-07-20 DIAGNOSIS — M47817 Spondylosis without myelopathy or radiculopathy, lumbosacral region: Secondary | ICD-10-CM | POA: Diagnosis not present

## 2022-07-20 DIAGNOSIS — M961 Postlaminectomy syndrome, not elsewhere classified: Secondary | ICD-10-CM | POA: Diagnosis not present

## 2022-07-20 DIAGNOSIS — S00451A Superficial foreign body of right ear, initial encounter: Secondary | ICD-10-CM | POA: Diagnosis not present

## 2022-07-20 DIAGNOSIS — G894 Chronic pain syndrome: Secondary | ICD-10-CM | POA: Diagnosis not present

## 2022-07-20 DIAGNOSIS — M47812 Spondylosis without myelopathy or radiculopathy, cervical region: Secondary | ICD-10-CM | POA: Diagnosis not present

## 2022-07-27 DIAGNOSIS — G4733 Obstructive sleep apnea (adult) (pediatric): Secondary | ICD-10-CM | POA: Diagnosis not present

## 2022-07-27 DIAGNOSIS — R69 Illness, unspecified: Secondary | ICD-10-CM | POA: Diagnosis not present

## 2022-07-27 DIAGNOSIS — Z1331 Encounter for screening for depression: Secondary | ICD-10-CM | POA: Diagnosis not present

## 2022-07-27 DIAGNOSIS — N183 Chronic kidney disease, stage 3 unspecified: Secondary | ICD-10-CM | POA: Diagnosis not present

## 2022-07-27 DIAGNOSIS — Z6834 Body mass index (BMI) 34.0-34.9, adult: Secondary | ICD-10-CM | POA: Diagnosis not present

## 2022-07-27 DIAGNOSIS — Z0000 Encounter for general adult medical examination without abnormal findings: Secondary | ICD-10-CM | POA: Diagnosis not present

## 2022-07-27 DIAGNOSIS — Z9181 History of falling: Secondary | ICD-10-CM | POA: Diagnosis not present

## 2022-08-13 DIAGNOSIS — N183 Chronic kidney disease, stage 3 unspecified: Secondary | ICD-10-CM | POA: Diagnosis not present

## 2022-08-13 DIAGNOSIS — E782 Mixed hyperlipidemia: Secondary | ICD-10-CM | POA: Diagnosis not present

## 2022-08-13 DIAGNOSIS — I10 Essential (primary) hypertension: Secondary | ICD-10-CM | POA: Diagnosis not present

## 2022-08-15 DIAGNOSIS — G4733 Obstructive sleep apnea (adult) (pediatric): Secondary | ICD-10-CM | POA: Diagnosis not present

## 2022-08-16 DIAGNOSIS — H50112 Monocular exotropia, left eye: Secondary | ICD-10-CM | POA: Diagnosis not present

## 2022-08-16 DIAGNOSIS — H52203 Unspecified astigmatism, bilateral: Secondary | ICD-10-CM | POA: Diagnosis not present

## 2022-08-16 DIAGNOSIS — H02834 Dermatochalasis of left upper eyelid: Secondary | ICD-10-CM | POA: Diagnosis not present

## 2022-08-16 DIAGNOSIS — H2513 Age-related nuclear cataract, bilateral: Secondary | ICD-10-CM | POA: Diagnosis not present

## 2022-08-16 DIAGNOSIS — H524 Presbyopia: Secondary | ICD-10-CM | POA: Diagnosis not present

## 2022-08-16 DIAGNOSIS — H5203 Hypermetropia, bilateral: Secondary | ICD-10-CM | POA: Diagnosis not present

## 2022-08-16 DIAGNOSIS — H43393 Other vitreous opacities, bilateral: Secondary | ICD-10-CM | POA: Diagnosis not present

## 2022-08-16 DIAGNOSIS — H02831 Dermatochalasis of right upper eyelid: Secondary | ICD-10-CM | POA: Diagnosis not present

## 2022-08-21 DIAGNOSIS — M47817 Spondylosis without myelopathy or radiculopathy, lumbosacral region: Secondary | ICD-10-CM | POA: Diagnosis not present

## 2022-08-21 DIAGNOSIS — Z79891 Long term (current) use of opiate analgesic: Secondary | ICD-10-CM | POA: Diagnosis not present

## 2022-08-21 DIAGNOSIS — M961 Postlaminectomy syndrome, not elsewhere classified: Secondary | ICD-10-CM | POA: Diagnosis not present

## 2022-08-21 DIAGNOSIS — M47812 Spondylosis without myelopathy or radiculopathy, cervical region: Secondary | ICD-10-CM | POA: Diagnosis not present

## 2022-08-21 DIAGNOSIS — G894 Chronic pain syndrome: Secondary | ICD-10-CM | POA: Diagnosis not present

## 2022-08-25 DIAGNOSIS — G4733 Obstructive sleep apnea (adult) (pediatric): Secondary | ICD-10-CM | POA: Diagnosis not present

## 2022-09-14 DIAGNOSIS — G4733 Obstructive sleep apnea (adult) (pediatric): Secondary | ICD-10-CM | POA: Diagnosis not present

## 2022-09-15 DIAGNOSIS — R0902 Hypoxemia: Secondary | ICD-10-CM | POA: Diagnosis not present

## 2022-09-19 DIAGNOSIS — M961 Postlaminectomy syndrome, not elsewhere classified: Secondary | ICD-10-CM | POA: Diagnosis not present

## 2022-09-19 DIAGNOSIS — M47817 Spondylosis without myelopathy or radiculopathy, lumbosacral region: Secondary | ICD-10-CM | POA: Diagnosis not present

## 2022-09-19 DIAGNOSIS — M47812 Spondylosis without myelopathy or radiculopathy, cervical region: Secondary | ICD-10-CM | POA: Diagnosis not present

## 2022-09-19 DIAGNOSIS — G894 Chronic pain syndrome: Secondary | ICD-10-CM | POA: Diagnosis not present

## 2022-09-20 DIAGNOSIS — C44519 Basal cell carcinoma of skin of other part of trunk: Secondary | ICD-10-CM | POA: Diagnosis not present

## 2022-09-20 DIAGNOSIS — L821 Other seborrheic keratosis: Secondary | ICD-10-CM | POA: Diagnosis not present

## 2022-10-04 DIAGNOSIS — N183 Chronic kidney disease, stage 3 unspecified: Secondary | ICD-10-CM | POA: Diagnosis not present

## 2022-10-04 DIAGNOSIS — G5793 Unspecified mononeuropathy of bilateral lower limbs: Secondary | ICD-10-CM | POA: Diagnosis not present

## 2022-10-04 DIAGNOSIS — G4733 Obstructive sleep apnea (adult) (pediatric): Secondary | ICD-10-CM | POA: Diagnosis not present

## 2022-10-04 DIAGNOSIS — F1190 Opioid use, unspecified, uncomplicated: Secondary | ICD-10-CM | POA: Diagnosis not present

## 2022-10-04 DIAGNOSIS — Z6834 Body mass index (BMI) 34.0-34.9, adult: Secondary | ICD-10-CM | POA: Diagnosis not present

## 2022-10-15 DIAGNOSIS — G4733 Obstructive sleep apnea (adult) (pediatric): Secondary | ICD-10-CM | POA: Diagnosis not present

## 2022-10-16 DIAGNOSIS — R0902 Hypoxemia: Secondary | ICD-10-CM | POA: Diagnosis not present

## 2022-10-19 DIAGNOSIS — M961 Postlaminectomy syndrome, not elsewhere classified: Secondary | ICD-10-CM | POA: Diagnosis not present

## 2022-10-19 DIAGNOSIS — M47812 Spondylosis without myelopathy or radiculopathy, cervical region: Secondary | ICD-10-CM | POA: Diagnosis not present

## 2022-10-19 DIAGNOSIS — G894 Chronic pain syndrome: Secondary | ICD-10-CM | POA: Diagnosis not present

## 2022-10-19 DIAGNOSIS — M47817 Spondylosis without myelopathy or radiculopathy, lumbosacral region: Secondary | ICD-10-CM | POA: Diagnosis not present

## 2022-11-12 DIAGNOSIS — N183 Chronic kidney disease, stage 3 unspecified: Secondary | ICD-10-CM | POA: Diagnosis not present

## 2022-11-12 DIAGNOSIS — E782 Mixed hyperlipidemia: Secondary | ICD-10-CM | POA: Diagnosis not present

## 2022-11-12 DIAGNOSIS — I10 Essential (primary) hypertension: Secondary | ICD-10-CM | POA: Diagnosis not present

## 2022-11-14 DIAGNOSIS — G4733 Obstructive sleep apnea (adult) (pediatric): Secondary | ICD-10-CM | POA: Diagnosis not present

## 2022-11-15 DIAGNOSIS — R0902 Hypoxemia: Secondary | ICD-10-CM | POA: Diagnosis not present

## 2022-11-20 DIAGNOSIS — G894 Chronic pain syndrome: Secondary | ICD-10-CM | POA: Diagnosis not present

## 2022-11-20 DIAGNOSIS — M961 Postlaminectomy syndrome, not elsewhere classified: Secondary | ICD-10-CM | POA: Diagnosis not present

## 2022-11-20 DIAGNOSIS — M47817 Spondylosis without myelopathy or radiculopathy, lumbosacral region: Secondary | ICD-10-CM | POA: Diagnosis not present

## 2022-11-20 DIAGNOSIS — M47812 Spondylosis without myelopathy or radiculopathy, cervical region: Secondary | ICD-10-CM | POA: Diagnosis not present

## 2022-11-24 DIAGNOSIS — G4733 Obstructive sleep apnea (adult) (pediatric): Secondary | ICD-10-CM | POA: Diagnosis not present

## 2022-12-12 DIAGNOSIS — M6208 Separation of muscle (nontraumatic), other site: Secondary | ICD-10-CM | POA: Diagnosis not present

## 2022-12-12 DIAGNOSIS — Z6834 Body mass index (BMI) 34.0-34.9, adult: Secondary | ICD-10-CM | POA: Diagnosis not present

## 2022-12-15 DIAGNOSIS — G4733 Obstructive sleep apnea (adult) (pediatric): Secondary | ICD-10-CM | POA: Diagnosis not present

## 2022-12-16 DIAGNOSIS — R0902 Hypoxemia: Secondary | ICD-10-CM | POA: Diagnosis not present

## 2022-12-21 DIAGNOSIS — M47812 Spondylosis without myelopathy or radiculopathy, cervical region: Secondary | ICD-10-CM | POA: Diagnosis not present

## 2022-12-21 DIAGNOSIS — G894 Chronic pain syndrome: Secondary | ICD-10-CM | POA: Diagnosis not present

## 2022-12-21 DIAGNOSIS — M961 Postlaminectomy syndrome, not elsewhere classified: Secondary | ICD-10-CM | POA: Diagnosis not present

## 2022-12-21 DIAGNOSIS — M47817 Spondylosis without myelopathy or radiculopathy, lumbosacral region: Secondary | ICD-10-CM | POA: Diagnosis not present

## 2023-01-15 DIAGNOSIS — G4733 Obstructive sleep apnea (adult) (pediatric): Secondary | ICD-10-CM | POA: Diagnosis not present

## 2023-01-16 DIAGNOSIS — R0902 Hypoxemia: Secondary | ICD-10-CM | POA: Diagnosis not present

## 2023-01-22 DIAGNOSIS — G894 Chronic pain syndrome: Secondary | ICD-10-CM | POA: Diagnosis not present

## 2023-01-22 DIAGNOSIS — M961 Postlaminectomy syndrome, not elsewhere classified: Secondary | ICD-10-CM | POA: Diagnosis not present

## 2023-01-22 DIAGNOSIS — M47812 Spondylosis without myelopathy or radiculopathy, cervical region: Secondary | ICD-10-CM | POA: Diagnosis not present

## 2023-01-22 DIAGNOSIS — M47817 Spondylosis without myelopathy or radiculopathy, lumbosacral region: Secondary | ICD-10-CM | POA: Diagnosis not present

## 2023-02-14 DIAGNOSIS — G4733 Obstructive sleep apnea (adult) (pediatric): Secondary | ICD-10-CM | POA: Diagnosis not present

## 2023-02-15 DIAGNOSIS — R0902 Hypoxemia: Secondary | ICD-10-CM | POA: Diagnosis not present

## 2023-02-15 DIAGNOSIS — M47817 Spondylosis without myelopathy or radiculopathy, lumbosacral region: Secondary | ICD-10-CM | POA: Diagnosis not present

## 2023-02-15 DIAGNOSIS — M47812 Spondylosis without myelopathy or radiculopathy, cervical region: Secondary | ICD-10-CM | POA: Diagnosis not present

## 2023-02-15 DIAGNOSIS — M961 Postlaminectomy syndrome, not elsewhere classified: Secondary | ICD-10-CM | POA: Diagnosis not present

## 2023-02-15 DIAGNOSIS — G894 Chronic pain syndrome: Secondary | ICD-10-CM | POA: Diagnosis not present

## 2023-02-15 DIAGNOSIS — Z79891 Long term (current) use of opiate analgesic: Secondary | ICD-10-CM | POA: Diagnosis not present

## 2023-02-26 DIAGNOSIS — G4733 Obstructive sleep apnea (adult) (pediatric): Secondary | ICD-10-CM | POA: Diagnosis not present

## 2023-03-12 DIAGNOSIS — Z008 Encounter for other general examination: Secondary | ICD-10-CM | POA: Diagnosis not present

## 2023-03-17 DIAGNOSIS — G4733 Obstructive sleep apnea (adult) (pediatric): Secondary | ICD-10-CM | POA: Diagnosis not present

## 2023-03-18 DIAGNOSIS — R0902 Hypoxemia: Secondary | ICD-10-CM | POA: Diagnosis not present

## 2023-03-22 DIAGNOSIS — M47817 Spondylosis without myelopathy or radiculopathy, lumbosacral region: Secondary | ICD-10-CM | POA: Diagnosis not present

## 2023-03-22 DIAGNOSIS — M961 Postlaminectomy syndrome, not elsewhere classified: Secondary | ICD-10-CM | POA: Diagnosis not present

## 2023-03-22 DIAGNOSIS — M47812 Spondylosis without myelopathy or radiculopathy, cervical region: Secondary | ICD-10-CM | POA: Diagnosis not present

## 2023-03-22 DIAGNOSIS — G894 Chronic pain syndrome: Secondary | ICD-10-CM | POA: Diagnosis not present

## 2023-04-16 DIAGNOSIS — G4733 Obstructive sleep apnea (adult) (pediatric): Secondary | ICD-10-CM | POA: Diagnosis not present

## 2023-04-17 DIAGNOSIS — R0902 Hypoxemia: Secondary | ICD-10-CM | POA: Diagnosis not present

## 2023-04-23 DIAGNOSIS — M47812 Spondylosis without myelopathy or radiculopathy, cervical region: Secondary | ICD-10-CM | POA: Diagnosis not present

## 2023-04-23 DIAGNOSIS — G894 Chronic pain syndrome: Secondary | ICD-10-CM | POA: Diagnosis not present

## 2023-04-23 DIAGNOSIS — M961 Postlaminectomy syndrome, not elsewhere classified: Secondary | ICD-10-CM | POA: Diagnosis not present

## 2023-04-23 DIAGNOSIS — M47817 Spondylosis without myelopathy or radiculopathy, lumbosacral region: Secondary | ICD-10-CM | POA: Diagnosis not present

## 2023-05-17 DIAGNOSIS — G4733 Obstructive sleep apnea (adult) (pediatric): Secondary | ICD-10-CM | POA: Diagnosis not present

## 2023-05-18 DIAGNOSIS — R0902 Hypoxemia: Secondary | ICD-10-CM | POA: Diagnosis not present

## 2023-05-22 DIAGNOSIS — M47817 Spondylosis without myelopathy or radiculopathy, lumbosacral region: Secondary | ICD-10-CM | POA: Diagnosis not present

## 2023-05-22 DIAGNOSIS — Z79891 Long term (current) use of opiate analgesic: Secondary | ICD-10-CM | POA: Diagnosis not present

## 2023-05-22 DIAGNOSIS — M961 Postlaminectomy syndrome, not elsewhere classified: Secondary | ICD-10-CM | POA: Diagnosis not present

## 2023-05-22 DIAGNOSIS — M47812 Spondylosis without myelopathy or radiculopathy, cervical region: Secondary | ICD-10-CM | POA: Diagnosis not present

## 2023-05-22 DIAGNOSIS — G894 Chronic pain syndrome: Secondary | ICD-10-CM | POA: Diagnosis not present

## 2023-05-23 DIAGNOSIS — G894 Chronic pain syndrome: Secondary | ICD-10-CM | POA: Diagnosis not present

## 2023-05-23 DIAGNOSIS — Z79891 Long term (current) use of opiate analgesic: Secondary | ICD-10-CM | POA: Diagnosis not present

## 2023-05-25 DIAGNOSIS — R051 Acute cough: Secondary | ICD-10-CM | POA: Diagnosis not present

## 2023-05-25 DIAGNOSIS — M791 Myalgia, unspecified site: Secondary | ICD-10-CM | POA: Diagnosis not present

## 2023-05-25 DIAGNOSIS — R509 Fever, unspecified: Secondary | ICD-10-CM | POA: Diagnosis not present

## 2023-05-25 DIAGNOSIS — R0981 Nasal congestion: Secondary | ICD-10-CM | POA: Diagnosis not present

## 2023-05-30 DIAGNOSIS — G4733 Obstructive sleep apnea (adult) (pediatric): Secondary | ICD-10-CM | POA: Diagnosis not present

## 2023-06-17 DIAGNOSIS — G4733 Obstructive sleep apnea (adult) (pediatric): Secondary | ICD-10-CM | POA: Diagnosis not present

## 2023-06-18 DIAGNOSIS — R0902 Hypoxemia: Secondary | ICD-10-CM | POA: Diagnosis not present

## 2023-06-27 DIAGNOSIS — M47817 Spondylosis without myelopathy or radiculopathy, lumbosacral region: Secondary | ICD-10-CM | POA: Diagnosis not present

## 2023-06-27 DIAGNOSIS — G894 Chronic pain syndrome: Secondary | ICD-10-CM | POA: Diagnosis not present

## 2023-06-27 DIAGNOSIS — M47812 Spondylosis without myelopathy or radiculopathy, cervical region: Secondary | ICD-10-CM | POA: Diagnosis not present

## 2023-06-27 DIAGNOSIS — M961 Postlaminectomy syndrome, not elsewhere classified: Secondary | ICD-10-CM | POA: Diagnosis not present

## 2023-07-15 DIAGNOSIS — G4733 Obstructive sleep apnea (adult) (pediatric): Secondary | ICD-10-CM | POA: Diagnosis not present

## 2023-07-16 DIAGNOSIS — R0902 Hypoxemia: Secondary | ICD-10-CM | POA: Diagnosis not present

## 2023-07-26 DIAGNOSIS — M47812 Spondylosis without myelopathy or radiculopathy, cervical region: Secondary | ICD-10-CM | POA: Diagnosis not present

## 2023-07-26 DIAGNOSIS — M47817 Spondylosis without myelopathy or radiculopathy, lumbosacral region: Secondary | ICD-10-CM | POA: Diagnosis not present

## 2023-07-26 DIAGNOSIS — M961 Postlaminectomy syndrome, not elsewhere classified: Secondary | ICD-10-CM | POA: Diagnosis not present

## 2023-07-26 DIAGNOSIS — G894 Chronic pain syndrome: Secondary | ICD-10-CM | POA: Diagnosis not present

## 2023-08-16 DIAGNOSIS — R0902 Hypoxemia: Secondary | ICD-10-CM | POA: Diagnosis not present

## 2023-08-17 DIAGNOSIS — H52202 Unspecified astigmatism, left eye: Secondary | ICD-10-CM | POA: Diagnosis not present

## 2023-08-17 DIAGNOSIS — H353121 Nonexudative age-related macular degeneration, left eye, early dry stage: Secondary | ICD-10-CM | POA: Diagnosis not present

## 2023-08-17 DIAGNOSIS — H5010 Unspecified exotropia: Secondary | ICD-10-CM | POA: Diagnosis not present

## 2023-08-17 DIAGNOSIS — H02831 Dermatochalasis of right upper eyelid: Secondary | ICD-10-CM | POA: Diagnosis not present

## 2023-08-17 DIAGNOSIS — H318 Other specified disorders of choroid: Secondary | ICD-10-CM | POA: Diagnosis not present

## 2023-08-17 DIAGNOSIS — H5203 Hypermetropia, bilateral: Secondary | ICD-10-CM | POA: Diagnosis not present

## 2023-08-17 DIAGNOSIS — H11823 Conjunctivochalasis, bilateral: Secondary | ICD-10-CM | POA: Diagnosis not present

## 2023-08-17 DIAGNOSIS — H2513 Age-related nuclear cataract, bilateral: Secondary | ICD-10-CM | POA: Diagnosis not present

## 2023-08-17 DIAGNOSIS — H02834 Dermatochalasis of left upper eyelid: Secondary | ICD-10-CM | POA: Diagnosis not present

## 2023-08-17 DIAGNOSIS — H3589 Other specified retinal disorders: Secondary | ICD-10-CM | POA: Diagnosis not present

## 2023-08-17 DIAGNOSIS — H524 Presbyopia: Secondary | ICD-10-CM | POA: Diagnosis not present

## 2023-08-21 DIAGNOSIS — H02831 Dermatochalasis of right upper eyelid: Secondary | ICD-10-CM | POA: Diagnosis not present

## 2023-08-21 DIAGNOSIS — H02834 Dermatochalasis of left upper eyelid: Secondary | ICD-10-CM | POA: Diagnosis not present

## 2023-08-27 DIAGNOSIS — G894 Chronic pain syndrome: Secondary | ICD-10-CM | POA: Diagnosis not present

## 2023-08-27 DIAGNOSIS — M961 Postlaminectomy syndrome, not elsewhere classified: Secondary | ICD-10-CM | POA: Diagnosis not present

## 2023-08-27 DIAGNOSIS — M47812 Spondylosis without myelopathy or radiculopathy, cervical region: Secondary | ICD-10-CM | POA: Diagnosis not present

## 2023-08-27 DIAGNOSIS — M47817 Spondylosis without myelopathy or radiculopathy, lumbosacral region: Secondary | ICD-10-CM | POA: Diagnosis not present

## 2023-08-29 DIAGNOSIS — G4733 Obstructive sleep apnea (adult) (pediatric): Secondary | ICD-10-CM | POA: Diagnosis not present

## 2023-09-10 DIAGNOSIS — H318 Other specified disorders of choroid: Secondary | ICD-10-CM | POA: Diagnosis not present

## 2023-09-10 DIAGNOSIS — H353121 Nonexudative age-related macular degeneration, left eye, early dry stage: Secondary | ICD-10-CM | POA: Diagnosis not present

## 2023-09-10 DIAGNOSIS — H3581 Retinal edema: Secondary | ICD-10-CM | POA: Diagnosis not present

## 2023-09-10 DIAGNOSIS — H2513 Age-related nuclear cataract, bilateral: Secondary | ICD-10-CM | POA: Diagnosis not present

## 2023-09-10 DIAGNOSIS — H353211 Exudative age-related macular degeneration, right eye, with active choroidal neovascularization: Secondary | ICD-10-CM | POA: Diagnosis not present

## 2023-09-15 DIAGNOSIS — R0902 Hypoxemia: Secondary | ICD-10-CM | POA: Diagnosis not present

## 2023-09-24 DIAGNOSIS — Z0100 Encounter for examination of eyes and vision without abnormal findings: Secondary | ICD-10-CM | POA: Diagnosis not present

## 2023-10-01 DIAGNOSIS — G894 Chronic pain syndrome: Secondary | ICD-10-CM | POA: Diagnosis not present

## 2023-10-01 DIAGNOSIS — M961 Postlaminectomy syndrome, not elsewhere classified: Secondary | ICD-10-CM | POA: Diagnosis not present

## 2023-10-01 DIAGNOSIS — M47817 Spondylosis without myelopathy or radiculopathy, lumbosacral region: Secondary | ICD-10-CM | POA: Diagnosis not present

## 2023-10-01 DIAGNOSIS — M47812 Spondylosis without myelopathy or radiculopathy, cervical region: Secondary | ICD-10-CM | POA: Diagnosis not present

## 2023-10-16 DIAGNOSIS — R0902 Hypoxemia: Secondary | ICD-10-CM | POA: Diagnosis not present

## 2023-10-18 DIAGNOSIS — H2513 Age-related nuclear cataract, bilateral: Secondary | ICD-10-CM | POA: Diagnosis not present

## 2023-10-18 DIAGNOSIS — H353121 Nonexudative age-related macular degeneration, left eye, early dry stage: Secondary | ICD-10-CM | POA: Diagnosis not present

## 2023-10-18 DIAGNOSIS — H3581 Retinal edema: Secondary | ICD-10-CM | POA: Diagnosis not present

## 2023-10-18 DIAGNOSIS — H353211 Exudative age-related macular degeneration, right eye, with active choroidal neovascularization: Secondary | ICD-10-CM | POA: Diagnosis not present

## 2023-10-20 DIAGNOSIS — S239XXA Sprain of unspecified parts of thorax, initial encounter: Secondary | ICD-10-CM | POA: Diagnosis not present

## 2023-11-01 ENCOUNTER — Ambulatory Visit (HOSPITAL_COMMUNITY)
Admission: RE | Admit: 2023-11-01 | Discharge: 2023-11-01 | Disposition: A | Discharge status: Home / Self Care | Source: Ambulatory Visit | Attending: Physical Medicine and Rehabilitation | Admitting: Physical Medicine and Rehabilitation

## 2023-11-01 ENCOUNTER — Other Ambulatory Visit (HOSPITAL_COMMUNITY): Payer: Self-pay | Admitting: Physical Medicine and Rehabilitation

## 2023-11-01 DIAGNOSIS — R109 Unspecified abdominal pain: Secondary | ICD-10-CM | POA: Diagnosis not present

## 2023-11-01 DIAGNOSIS — N200 Calculus of kidney: Secondary | ICD-10-CM | POA: Insufficient documentation

## 2023-11-01 DIAGNOSIS — G894 Chronic pain syndrome: Secondary | ICD-10-CM | POA: Diagnosis not present

## 2023-11-01 DIAGNOSIS — R52 Pain, unspecified: Secondary | ICD-10-CM | POA: Insufficient documentation

## 2023-11-01 DIAGNOSIS — M47816 Spondylosis without myelopathy or radiculopathy, lumbar region: Secondary | ICD-10-CM | POA: Diagnosis not present

## 2023-11-01 DIAGNOSIS — M47812 Spondylosis without myelopathy or radiculopathy, cervical region: Secondary | ICD-10-CM | POA: Diagnosis not present

## 2023-11-01 DIAGNOSIS — M545 Low back pain, unspecified: Secondary | ICD-10-CM | POA: Diagnosis not present

## 2023-11-01 DIAGNOSIS — M47817 Spondylosis without myelopathy or radiculopathy, lumbosacral region: Secondary | ICD-10-CM | POA: Diagnosis not present

## 2023-11-01 DIAGNOSIS — M961 Postlaminectomy syndrome, not elsewhere classified: Secondary | ICD-10-CM | POA: Diagnosis not present

## 2023-11-01 DIAGNOSIS — M8580 Other specified disorders of bone density and structure, unspecified site: Secondary | ICD-10-CM | POA: Diagnosis not present

## 2023-11-15 DIAGNOSIS — R0902 Hypoxemia: Secondary | ICD-10-CM | POA: Diagnosis not present

## 2023-11-29 DIAGNOSIS — G4733 Obstructive sleep apnea (adult) (pediatric): Secondary | ICD-10-CM | POA: Diagnosis not present

## 2023-12-03 DIAGNOSIS — M47817 Spondylosis without myelopathy or radiculopathy, lumbosacral region: Secondary | ICD-10-CM | POA: Diagnosis not present

## 2023-12-03 DIAGNOSIS — M961 Postlaminectomy syndrome, not elsewhere classified: Secondary | ICD-10-CM | POA: Diagnosis not present

## 2023-12-03 DIAGNOSIS — M47812 Spondylosis without myelopathy or radiculopathy, cervical region: Secondary | ICD-10-CM | POA: Diagnosis not present

## 2023-12-03 DIAGNOSIS — G894 Chronic pain syndrome: Secondary | ICD-10-CM | POA: Diagnosis not present

## 2023-12-05 DIAGNOSIS — R059 Cough, unspecified: Secondary | ICD-10-CM | POA: Diagnosis not present

## 2023-12-05 DIAGNOSIS — R0981 Nasal congestion: Secondary | ICD-10-CM | POA: Diagnosis not present

## 2023-12-06 DIAGNOSIS — H2513 Age-related nuclear cataract, bilateral: Secondary | ICD-10-CM | POA: Diagnosis not present

## 2023-12-06 DIAGNOSIS — H353121 Nonexudative age-related macular degeneration, left eye, early dry stage: Secondary | ICD-10-CM | POA: Diagnosis not present

## 2023-12-06 DIAGNOSIS — H3581 Retinal edema: Secondary | ICD-10-CM | POA: Diagnosis not present

## 2023-12-06 DIAGNOSIS — H353211 Exudative age-related macular degeneration, right eye, with active choroidal neovascularization: Secondary | ICD-10-CM | POA: Diagnosis not present

## 2023-12-16 DIAGNOSIS — R0902 Hypoxemia: Secondary | ICD-10-CM | POA: Diagnosis not present

## 2024-01-02 DIAGNOSIS — M961 Postlaminectomy syndrome, not elsewhere classified: Secondary | ICD-10-CM | POA: Diagnosis not present

## 2024-01-02 DIAGNOSIS — G894 Chronic pain syndrome: Secondary | ICD-10-CM | POA: Diagnosis not present

## 2024-01-02 DIAGNOSIS — M47817 Spondylosis without myelopathy or radiculopathy, lumbosacral region: Secondary | ICD-10-CM | POA: Diagnosis not present

## 2024-01-02 DIAGNOSIS — Z79891 Long term (current) use of opiate analgesic: Secondary | ICD-10-CM | POA: Diagnosis not present

## 2024-01-02 DIAGNOSIS — M47812 Spondylosis without myelopathy or radiculopathy, cervical region: Secondary | ICD-10-CM | POA: Diagnosis not present

## 2024-01-03 DIAGNOSIS — H353121 Nonexudative age-related macular degeneration, left eye, early dry stage: Secondary | ICD-10-CM | POA: Diagnosis not present

## 2024-01-03 DIAGNOSIS — H2513 Age-related nuclear cataract, bilateral: Secondary | ICD-10-CM | POA: Diagnosis not present

## 2024-01-03 DIAGNOSIS — H353211 Exudative age-related macular degeneration, right eye, with active choroidal neovascularization: Secondary | ICD-10-CM | POA: Diagnosis not present

## 2024-01-03 DIAGNOSIS — H3581 Retinal edema: Secondary | ICD-10-CM | POA: Diagnosis not present

## 2024-01-16 DIAGNOSIS — R0902 Hypoxemia: Secondary | ICD-10-CM | POA: Diagnosis not present

## 2024-01-31 DIAGNOSIS — M47817 Spondylosis without myelopathy or radiculopathy, lumbosacral region: Secondary | ICD-10-CM | POA: Diagnosis not present

## 2024-01-31 DIAGNOSIS — M47812 Spondylosis without myelopathy or radiculopathy, cervical region: Secondary | ICD-10-CM | POA: Diagnosis not present

## 2024-01-31 DIAGNOSIS — G894 Chronic pain syndrome: Secondary | ICD-10-CM | POA: Diagnosis not present

## 2024-01-31 DIAGNOSIS — M961 Postlaminectomy syndrome, not elsewhere classified: Secondary | ICD-10-CM | POA: Diagnosis not present

## 2024-02-07 DIAGNOSIS — H3581 Retinal edema: Secondary | ICD-10-CM | POA: Diagnosis not present

## 2024-02-07 DIAGNOSIS — H353211 Exudative age-related macular degeneration, right eye, with active choroidal neovascularization: Secondary | ICD-10-CM | POA: Diagnosis not present

## 2024-02-07 DIAGNOSIS — H353121 Nonexudative age-related macular degeneration, left eye, early dry stage: Secondary | ICD-10-CM | POA: Diagnosis not present

## 2024-02-07 DIAGNOSIS — H2513 Age-related nuclear cataract, bilateral: Secondary | ICD-10-CM | POA: Diagnosis not present

## 2024-02-22 DIAGNOSIS — R0981 Nasal congestion: Secondary | ICD-10-CM | POA: Diagnosis not present

## 2024-02-22 DIAGNOSIS — R051 Acute cough: Secondary | ICD-10-CM | POA: Diagnosis not present

## 2024-03-10 DIAGNOSIS — H2513 Age-related nuclear cataract, bilateral: Secondary | ICD-10-CM | POA: Diagnosis not present

## 2024-03-10 DIAGNOSIS — H3581 Retinal edema: Secondary | ICD-10-CM | POA: Diagnosis not present

## 2024-03-10 DIAGNOSIS — H353121 Nonexudative age-related macular degeneration, left eye, early dry stage: Secondary | ICD-10-CM | POA: Diagnosis not present

## 2024-03-10 DIAGNOSIS — H353211 Exudative age-related macular degeneration, right eye, with active choroidal neovascularization: Secondary | ICD-10-CM | POA: Diagnosis not present

## 2024-03-17 DIAGNOSIS — R0902 Hypoxemia: Secondary | ICD-10-CM | POA: Diagnosis not present

## 2024-03-31 DIAGNOSIS — G894 Chronic pain syndrome: Secondary | ICD-10-CM | POA: Diagnosis not present

## 2024-03-31 DIAGNOSIS — M47812 Spondylosis without myelopathy or radiculopathy, cervical region: Secondary | ICD-10-CM | POA: Diagnosis not present

## 2024-03-31 DIAGNOSIS — M47817 Spondylosis without myelopathy or radiculopathy, lumbosacral region: Secondary | ICD-10-CM | POA: Diagnosis not present

## 2024-03-31 DIAGNOSIS — M961 Postlaminectomy syndrome, not elsewhere classified: Secondary | ICD-10-CM | POA: Diagnosis not present

## 2024-04-16 DIAGNOSIS — R0902 Hypoxemia: Secondary | ICD-10-CM | POA: Diagnosis not present

## 2024-04-29 DIAGNOSIS — H3581 Retinal edema: Secondary | ICD-10-CM | POA: Diagnosis not present

## 2024-04-29 DIAGNOSIS — H353121 Nonexudative age-related macular degeneration, left eye, early dry stage: Secondary | ICD-10-CM | POA: Diagnosis not present

## 2024-04-29 DIAGNOSIS — H35362 Drusen (degenerative) of macula, left eye: Secondary | ICD-10-CM | POA: Diagnosis not present

## 2024-04-29 DIAGNOSIS — H353211 Exudative age-related macular degeneration, right eye, with active choroidal neovascularization: Secondary | ICD-10-CM | POA: Diagnosis not present

## 2024-05-06 DIAGNOSIS — G894 Chronic pain syndrome: Secondary | ICD-10-CM | POA: Diagnosis not present

## 2024-05-06 DIAGNOSIS — M47817 Spondylosis without myelopathy or radiculopathy, lumbosacral region: Secondary | ICD-10-CM | POA: Diagnosis not present

## 2024-05-06 DIAGNOSIS — M961 Postlaminectomy syndrome, not elsewhere classified: Secondary | ICD-10-CM | POA: Diagnosis not present

## 2024-05-06 DIAGNOSIS — M47812 Spondylosis without myelopathy or radiculopathy, cervical region: Secondary | ICD-10-CM | POA: Diagnosis not present
